# Patient Record
Sex: Male | Born: 1965 | Race: Black or African American | Hispanic: No | Marital: Single | Smoking: Never smoker
Health system: Southern US, Community
[De-identification: ages and names within clinical notes are randomized; demographics above are authoritative.]

## PROBLEM LIST (undated history)

## (undated) DIAGNOSIS — I10 Essential (primary) hypertension: Secondary | ICD-10-CM

## (undated) DIAGNOSIS — D869 Sarcoidosis, unspecified: Secondary | ICD-10-CM

## (undated) DIAGNOSIS — K219 Gastro-esophageal reflux disease without esophagitis: Secondary | ICD-10-CM

## (undated) DIAGNOSIS — I509 Heart failure, unspecified: Secondary | ICD-10-CM

## (undated) DIAGNOSIS — D649 Anemia, unspecified: Secondary | ICD-10-CM

## (undated) DIAGNOSIS — M1990 Unspecified osteoarthritis, unspecified site: Secondary | ICD-10-CM

## (undated) DIAGNOSIS — J189 Pneumonia, unspecified organism: Secondary | ICD-10-CM

## (undated) DIAGNOSIS — N183 Chronic kidney disease, stage 3 unspecified: Secondary | ICD-10-CM

## (undated) HISTORY — PX: HERNIA REPAIR: SHX51

---

## 2005-07-16 ENCOUNTER — Emergency Department (HOSPITAL_COMMUNITY): Admission: EM | Admit: 2005-07-16 | Discharge: 2005-07-16 | Payer: Self-pay | Admitting: *Deleted

## 2009-10-24 ENCOUNTER — Emergency Department (HOSPITAL_COMMUNITY): Admission: EM | Admit: 2009-10-24 | Discharge: 2009-10-24 | Payer: Self-pay | Admitting: Emergency Medicine

## 2009-12-01 ENCOUNTER — Encounter: Admission: RE | Admit: 2009-12-01 | Discharge: 2009-12-01 | Payer: Self-pay | Admitting: Internal Medicine

## 2010-08-07 LAB — URINALYSIS, ROUTINE W REFLEX MICROSCOPIC
Ketones, ur: NEGATIVE mg/dL
Protein, ur: NEGATIVE mg/dL
Specific Gravity, Urine: 1.022 (ref 1.005–1.030)
Urobilinogen, UA: 1 mg/dL (ref 0.0–1.0)

## 2010-08-07 LAB — BASIC METABOLIC PANEL
Creatinine, Ser: 1.1 mg/dL (ref 0.4–1.5)
Sodium: 138 mEq/L (ref 135–145)

## 2010-08-07 LAB — CBC: HCT: 43.4 % (ref 39.0–52.0)

## 2010-09-05 IMAGING — CR DG CHEST 1V
2 series · 2 of 2 positions shown · non-contrast
Comparison: None.

CLINICAL DATA: 44-year-old male undergoing physical exam.

CHEST - 1 VIEW

[view not recorded (1 of 2)]
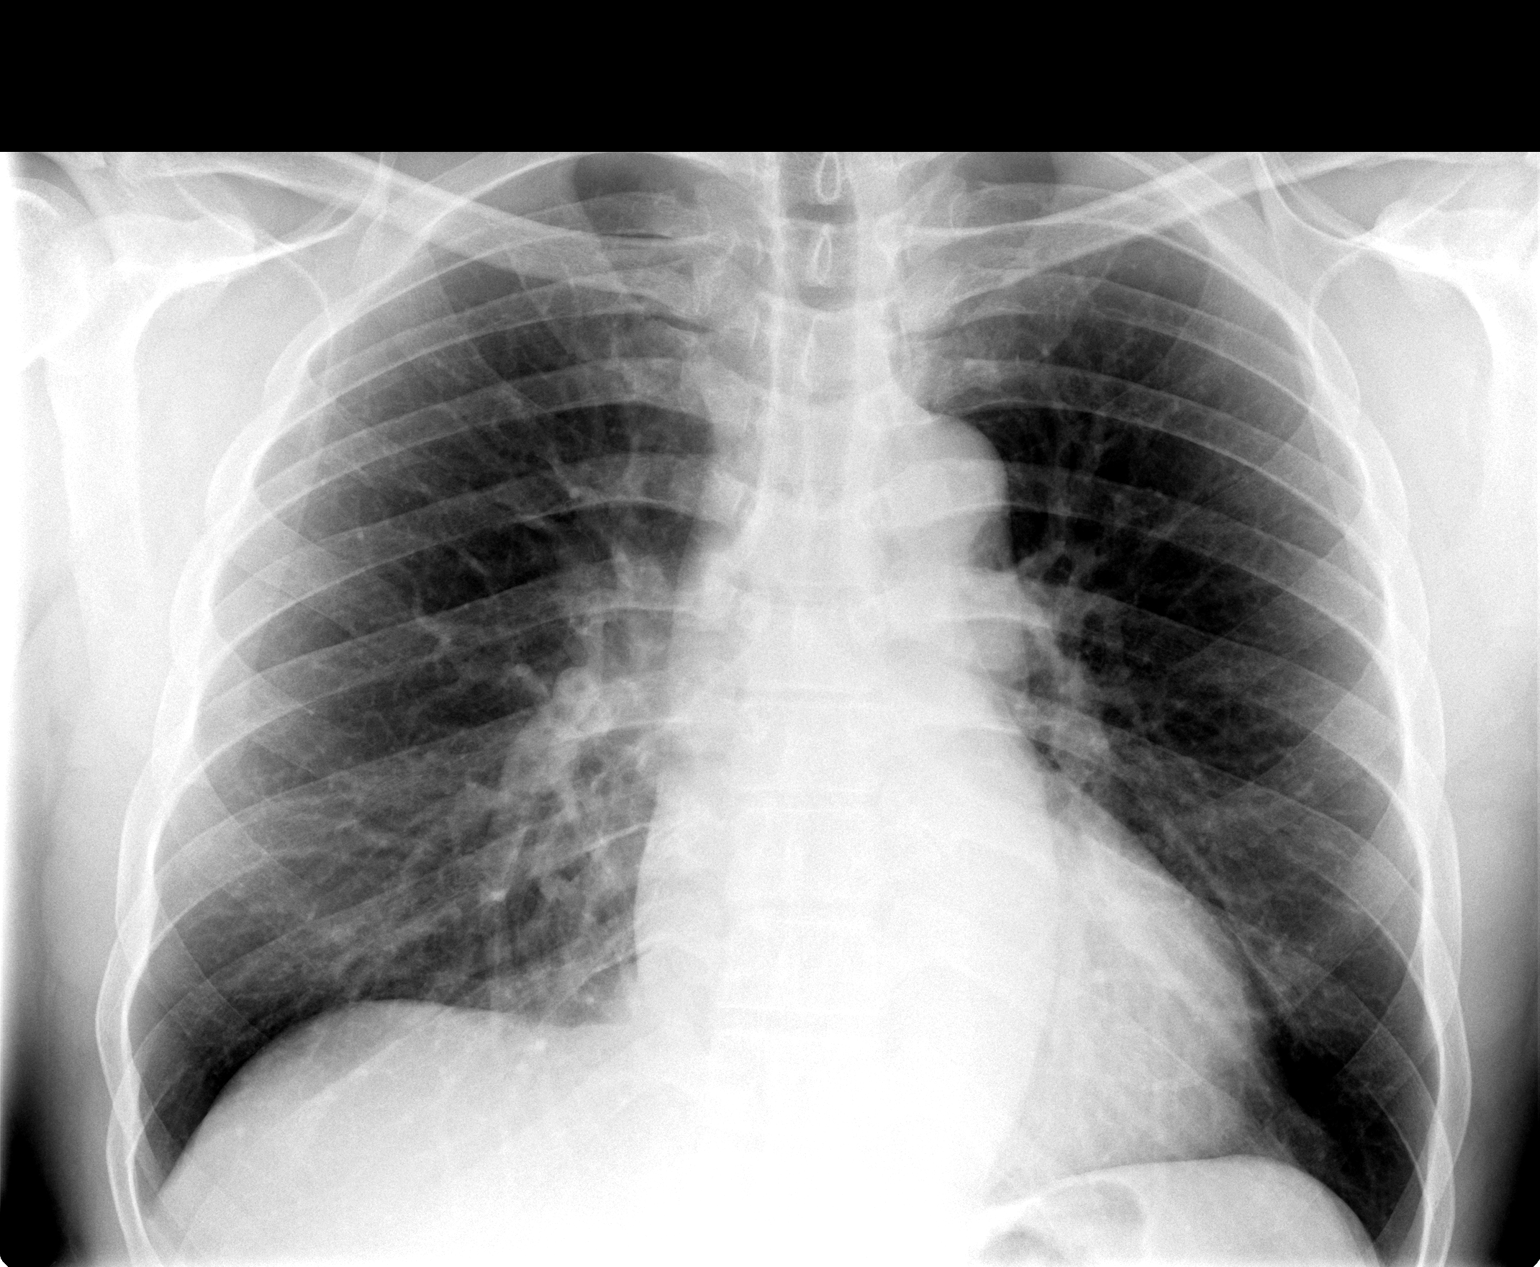

[view not recorded (2 of 2)]
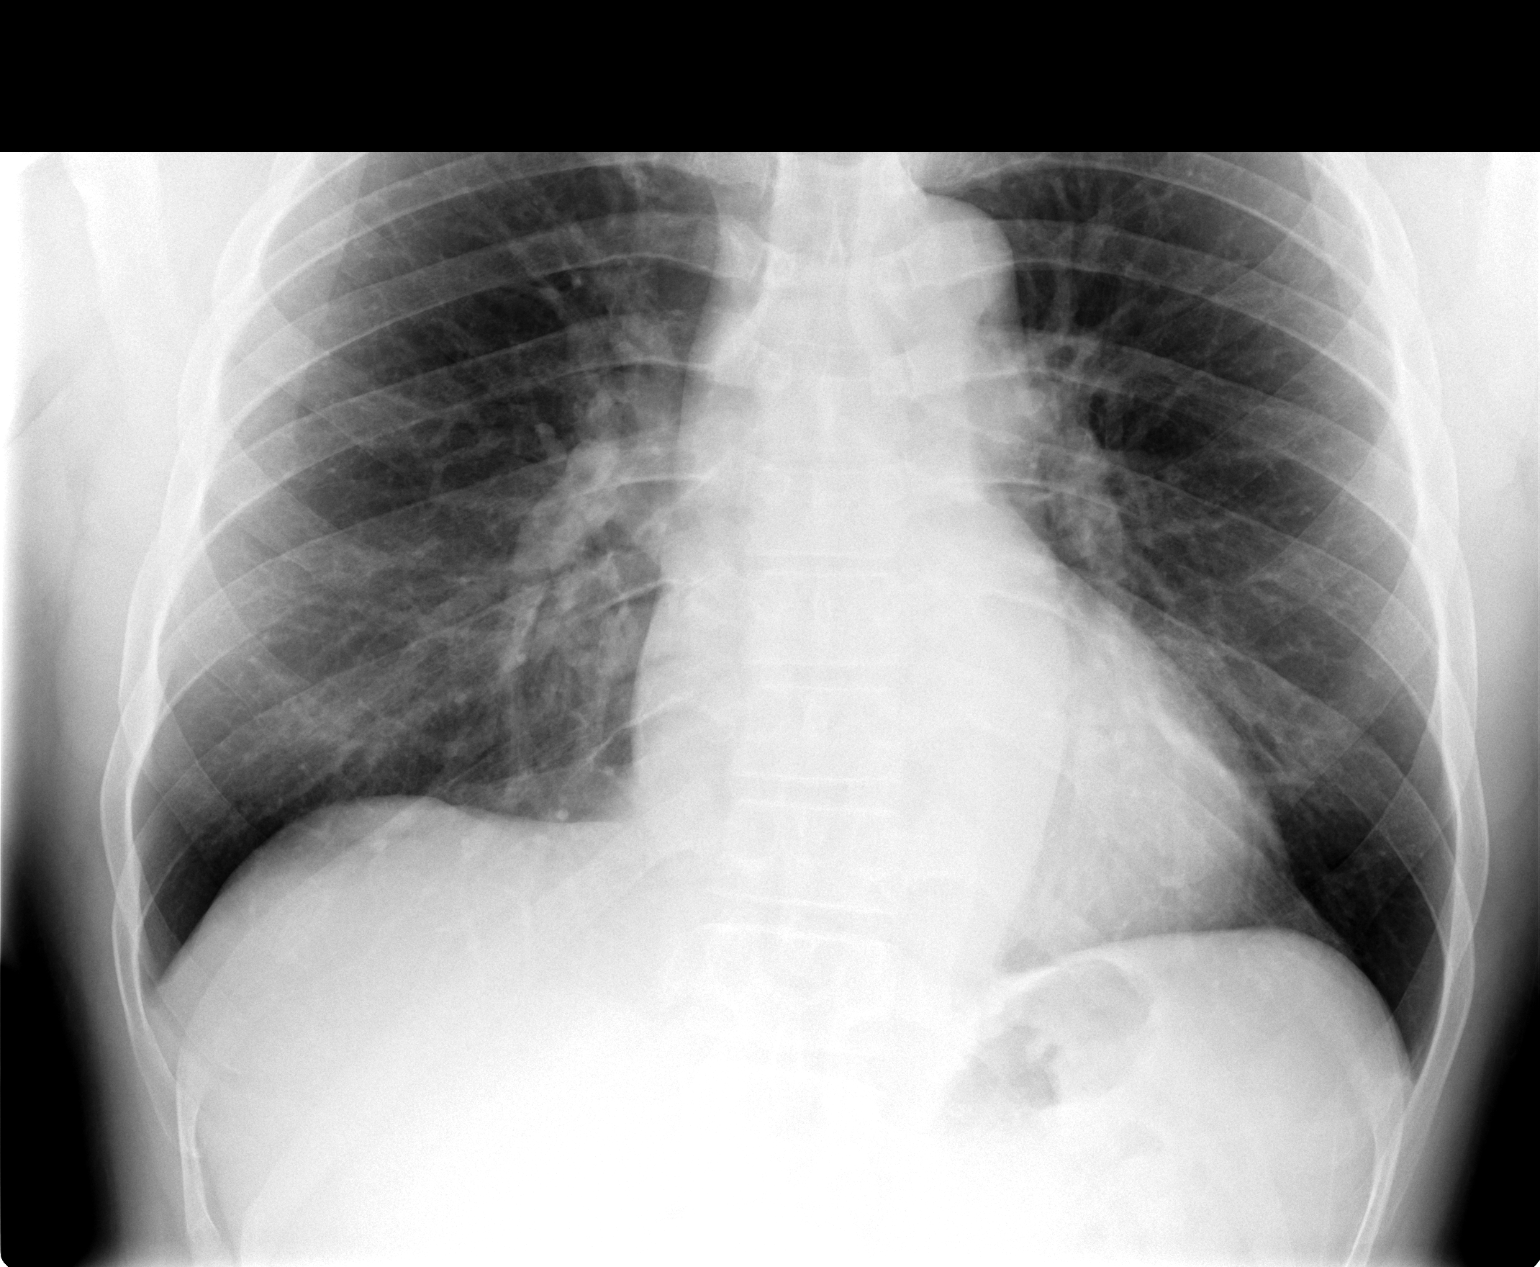

[2 of 2 positions shown; findings below may reference images not displayed]

FINDINGS: Mild elevation of the right hemidiaphragm.  Overall lung
volumes are within normal limits.  Cardiac size and mediastinal
contours are within normal limits.  Visualized tracheal air column
is within normal limits.  The lungs are clear. No acute osseous
abnormality identified.
IMPRESSION: No acute cardiopulmonary abnormality.

## 2013-04-24 ENCOUNTER — Encounter (HOSPITAL_COMMUNITY): Payer: Self-pay | Admitting: Emergency Medicine

## 2013-04-24 ENCOUNTER — Emergency Department (INDEPENDENT_AMBULATORY_CARE_PROVIDER_SITE_OTHER)
Admission: EM | Admit: 2013-04-24 | Discharge: 2013-04-24 | Disposition: A | Discharge status: Home / Self Care | Payer: PRIVATE HEALTH INSURANCE | Source: Home / Self Care

## 2013-04-24 DIAGNOSIS — R059 Cough, unspecified: Secondary | ICD-10-CM

## 2013-04-24 DIAGNOSIS — R05 Cough: Secondary | ICD-10-CM

## 2013-04-24 MED ORDER — IPRATROPIUM BROMIDE 0.06 % NA SOLN: 2.0000 | Freq: Four times a day (QID) | NASAL | Status: DC

## 2013-04-24 MED ORDER — HYDROCODONE-ACETAMINOPHEN 5-325 MG PO TABS: 0.5000 | ORAL_TABLET | Freq: Every evening | ORAL | Status: DC | PRN

## 2013-04-24 MED ORDER — AZITHROMYCIN 250 MG PO TABS: 250.0000 mg | ORAL_TABLET | Freq: Every day | ORAL | Status: DC

## 2013-04-24 NOTE — ED Provider Notes (Signed)
Ian Gregory is a 47 y.o. male who presents to Urgent Care today for 2 weeks of nonproductive cough. This is associated with some posttussive emesis as well. 3 weeks ago he had body aches fevers and chills. This resolved however he is left with this cough. He denies any current shortness of breath diarrhea chest pains or palpitations. He has tried some over-the-counter medications which have helped a little. He denies any reflux or wheezing.   Past Medical History  Diagnosis Date  . Hypertension    History  Substance Use Topics  . Smoking status: Never Smoker   . Smokeless tobacco: Not on file  . Alcohol Use: Yes   ROS as above Medications reviewed. No current facility-administered medications for this encounter.   Current Outpatient Prescriptions  Medication Sig Dispense Refill  . azithromycin (ZITHROMAX) 250 MG tablet Take 1 tablet (250 mg total) by mouth daily. Take first 2 tablets together, then 1 every day until finished.  6 tablet  0  . HYDROcodone-acetaminophen (NORCO/VICODIN) 5-325 MG per tablet Take 0.5 tablets by mouth at bedtime as needed (cough).  6 tablet  0  . ipratropium (ATROVENT) 0.06 % nasal spray Place 2 sprays into both nostrils 4 (four) times daily.  15 mL  1    Exam:  BP 158/105  Pulse 94  Temp(Src) 99.7 F (37.6 C) (Oral)  Resp 16  SpO2 100% Gen: Well NAD HEENT: EOMI,  MMM posterior pharynx with cobblestoning. Tympanic membranes are normal appearing bilaterally Lungs: Normal work of breathing. CTABL Heart: RRR no MRG Abd: NABS, Soft. NT, ND Exts: Non edematous BL  LE, warm and well perfused.    Assessment and Plan: 47 y.o. male with post oral cough versus bronchitis. Plan to treat with Atrovent nasal spray, hydrocodone cough medication, and azithromycin. Followup with primary care provider if not improved. Discussed warning signs or symptoms. Please see discharge instructions. Patient expresses understanding.      [name], MD 04/24/13  1249

## 2013-04-24 NOTE — ED Notes (Signed)
Pt c/o cold sxs onset 2 weeks Sxs include: dry cough and vomiting/nauseas Denies: f/d, SOB, wheezing Alert w/no signs of acute distress.

## 2013-06-08 ENCOUNTER — Emergency Department (HOSPITAL_COMMUNITY)
Admission: EM | Admit: 2013-06-08 | Discharge: 2013-06-09 | Disposition: A | Discharge status: Home / Self Care | Payer: Self-pay | Attending: Emergency Medicine | Admitting: Emergency Medicine

## 2013-06-08 ENCOUNTER — Encounter (HOSPITAL_COMMUNITY): Payer: Self-pay | Admitting: Emergency Medicine

## 2013-06-08 ENCOUNTER — Emergency Department (HOSPITAL_COMMUNITY): Payer: Self-pay

## 2013-06-08 DIAGNOSIS — R011 Cardiac murmur, unspecified: Secondary | ICD-10-CM | POA: Insufficient documentation

## 2013-06-08 DIAGNOSIS — R0602 Shortness of breath: Secondary | ICD-10-CM | POA: Insufficient documentation

## 2013-06-08 DIAGNOSIS — Z9119 Patient's noncompliance with other medical treatment and regimen: Secondary | ICD-10-CM | POA: Insufficient documentation

## 2013-06-08 DIAGNOSIS — R634 Abnormal weight loss: Secondary | ICD-10-CM | POA: Insufficient documentation

## 2013-06-08 DIAGNOSIS — R945 Abnormal results of liver function studies: Secondary | ICD-10-CM

## 2013-06-08 DIAGNOSIS — I10 Essential (primary) hypertension: Secondary | ICD-10-CM | POA: Insufficient documentation

## 2013-06-08 DIAGNOSIS — R918 Other nonspecific abnormal finding of lung field: Secondary | ICD-10-CM | POA: Insufficient documentation

## 2013-06-08 DIAGNOSIS — Z79899 Other long term (current) drug therapy: Secondary | ICD-10-CM | POA: Insufficient documentation

## 2013-06-08 DIAGNOSIS — R630 Anorexia: Secondary | ICD-10-CM | POA: Insufficient documentation

## 2013-06-08 DIAGNOSIS — R7989 Other specified abnormal findings of blood chemistry: Secondary | ICD-10-CM | POA: Insufficient documentation

## 2013-06-08 DIAGNOSIS — R9389 Abnormal findings on diagnostic imaging of other specified body structures: Secondary | ICD-10-CM

## 2013-06-08 DIAGNOSIS — Z91199 Patient's noncompliance with other medical treatment and regimen due to unspecified reason: Secondary | ICD-10-CM | POA: Insufficient documentation

## 2013-06-08 DIAGNOSIS — R61 Generalized hyperhidrosis: Secondary | ICD-10-CM | POA: Insufficient documentation

## 2013-06-08 DIAGNOSIS — N179 Acute kidney failure, unspecified: Secondary | ICD-10-CM | POA: Insufficient documentation

## 2013-06-08 DIAGNOSIS — R000 Tachycardia, unspecified: Secondary | ICD-10-CM | POA: Insufficient documentation

## 2013-06-08 LAB — CBC
HCT: 38.7 % — ABNORMAL LOW (ref 39.0–52.0)
HEMOGLOBIN: 13.1 g/dL (ref 13.0–17.0)
MCH: 29.4 pg (ref 26.0–34.0)
MCHC: 33.9 g/dL (ref 30.0–36.0)
MCV: 86.8 fL (ref 78.0–100.0)
Platelets: 183 10*3/uL (ref 150–400)
RBC: 4.46 MIL/uL (ref 4.22–5.81)
RDW: 13.4 % (ref 11.5–15.5)
WBC: 5.5 10*3/uL (ref 4.0–10.5)

## 2013-06-08 LAB — COMPREHENSIVE METABOLIC PANEL
ALT: 65 U/L — ABNORMAL HIGH (ref 0–53)
AST: 76 U/L — AB (ref 0–37)
Albumin: 3.7 g/dL (ref 3.5–5.2)
Alkaline Phosphatase: 251 U/L — ABNORMAL HIGH (ref 39–117)
BUN: 28 mg/dL — AB (ref 6–23)
CO2: 23 meq/L (ref 19–32)
Calcium: 10 mg/dL (ref 8.4–10.5)
Chloride: 97 mEq/L (ref 96–112)
Creatinine, Ser: 1.67 mg/dL — ABNORMAL HIGH (ref 0.50–1.35)
GFR calc Af Amer: 55 mL/min — ABNORMAL LOW (ref >90)
GFR calc non Af Amer: 47 mL/min — ABNORMAL LOW (ref >90)
Glucose, Bld: 90 mg/dL (ref 70–99)
Potassium: 4 mEq/L (ref 3.7–5.3)
Sodium: 136 mEq/L — ABNORMAL LOW (ref 137–147)
Total Bilirubin: 1.5 mg/dL — ABNORMAL HIGH (ref 0.3–1.2)
Total Protein: 8.5 g/dL — ABNORMAL HIGH (ref 6.0–8.3)

## 2013-06-08 LAB — POCT I-STAT TROPONIN I: TROPONIN I, POC: 0.03 ng/mL (ref 0.00–0.08)

## 2013-06-08 LAB — CG4 I-STAT (LACTIC ACID): Lactic Acid, Venous: 1.95 mmol/L (ref 0.5–2.2)

## 2013-06-08 LAB — LIPASE, BLOOD: Lipase: 48 U/L (ref 11–59)

## 2013-06-08 MED ORDER — SODIUM CHLORIDE 0.9 % IV BOLUS (SEPSIS)
1000.0000 mL | Freq: Once | INTRAVENOUS | Status: AC
  Administered 2013-06-08: 1000 mL via INTRAVENOUS

## 2013-06-08 MED ORDER — LISINOPRIL 20 MG PO TABS
20.0000 mg | ORAL_TABLET | Freq: Once | ORAL | Status: AC
  Administered 2013-06-09: 20 mg via ORAL
  Filled 2013-06-08: qty 1

## 2013-06-08 NOTE — ED Provider Notes (Signed)
CSN: [id number]     Arrival date & time 06/08/13  1903 History   First MD Initiated Contact with Patient 06/08/13 2059     Chief Complaint  Patient presents with  . Fatigue  . Night Sweats   (Consider location/radiation/quality/duration/timing/severity/associated sxs/prior Treatment) HPI Comments: Ian Gregory is a 48 year-old male with a past medical history of uncontrolled hypertention, presenting the Emergency Department with a chief complaint of generalized farigue and night sweats for 2 months.  He reports a 20 lb weight loss since November.  He also reports exertional dyspnea. Denies nausea, vomiting, diarrhea, constipation, decrease in appetite. He reports he was unable to have a stress test due to his BP being too elevated.  Prostate has been evaluated by Dr. Risa Grill.  Denies history of colonoscopy.  Reports occasional EtOH use on the weekends.  He also reports non compliance with his lisinopril-HCTZ for at least a month.      The history is provided by the patient, medical records and a significant other. No language interpreter was used.    Past Medical History  Diagnosis Date  . Hypertension    History reviewed. No pertinent past surgical history. Family History  Problem Relation Age of Onset  . Hypertension Mother   . Kidney failure Mother   . Diabetes Father   . Hypertension Father    History  Substance Use Topics  . Smoking status: Never Smoker   . Smokeless tobacco: Not on file  . Alcohol Use: Yes     Comment: socially    Review of Systems  Constitutional: Positive for fever, diaphoresis, appetite change, fatigue and unexpected weight change. Negative for chills.  Respiratory: Positive for shortness of breath. Negative for cough and chest tightness.   Cardiovascular: Negative for chest pain, palpitations and leg swelling.  Gastrointestinal: Negative for nausea, vomiting, abdominal pain, diarrhea and constipation.  Neurological: Positive for weakness.     Allergies  Review of patient's allergies indicates no known allergies.  Home Medications   Current Outpatient Rx  Name  Route  Sig  Dispense  Refill  . ibuprofen (ADVIL,MOTRIN) 200 MG tablet   Oral   Take 200 mg by mouth every 6 (six) hours as needed for moderate pain.         Marland Kitchen lisinopril-hydrochlorothiazide (PRINZIDE,ZESTORETIC) 20-25 MG per tablet   Oral   Take 1 tablet by mouth daily.          BP 190/110  Pulse 111  Temp(Src) 99 F (37.2 C) (Oral)  Resp 24  Ht 6\' 3"  (1.905 m)  Wt 189 lb 6 oz (85.9 kg)  BMI 23.67 kg/m2  SpO2 98% Physical Exam  Nursing note and vitals reviewed. Constitutional: He is oriented to person, place, and time. He appears well-developed and well-nourished. No distress.  HENT:  Head: Normocephalic and atraumatic.  Neck: Neck supple.  Cardiovascular: Regular rhythm.  Tachycardia present.   Murmur heard. No lower extremity edema  Pulmonary/Chest: Effort normal. No accessory muscle usage. Not tachypneic. No respiratory distress. He has no decreased breath sounds. He has no wheezes. He has no rhonchi.  Patient is able to speak in complete sentences.    Abdominal: Soft. There is no tenderness. There is no rebound and no guarding.  Musculoskeletal: Normal range of motion.  Neurological: He is alert and oriented to person, place, and time.  Skin: Skin is warm and dry. He is not diaphoretic.  Psychiatric: He has a normal mood and affect. His behavior is normal. Thought  content normal.    ED Course  Procedures (including critical care time) Labs Review Labs Reviewed  CBC - Abnormal; Notable for the following:    HCT 38.7 (*)    All other components within normal limits  COMPREHENSIVE METABOLIC PANEL - Abnormal; Notable for the following:    Sodium 136 (*)    BUN 28 (*)    Creatinine, Ser 1.67 (*)    Total Protein 8.5 (*)    AST 76 (*)    ALT 65 (*)    Alkaline Phosphatase 251 (*)    Total Bilirubin 1.5 (*)    GFR calc non Af Amer  47 (*)    GFR calc Af Amer 55 (*)    All other components within normal limits  LIPASE, BLOOD  POCT I-STAT TROPONIN I  CG4 I-STAT (LACTIC ACID)   Imaging Review Dg Chest 2 View  06/08/2013   CLINICAL DATA:  Fatigue. Night sweats. Cough for 2 months. Shortness of breath. Hypertension. Ex-smoker.  EXAM: CHEST  2 VIEW  COMPARISON:  DG CHEST 1 VIEW dated 12/01/2009  FINDINGS: Midline trachea. Normal heart size. Bilateral hilar and subcarinal adenopathy. No pleural effusion or pneumothorax. Clear lungs.  IMPRESSION: Since 12/01/2009, development of bilateral hilar and subcarinal adenopathy. Suspicious for lymphoproliferative process such as lymphoma. Inflammatory process such as sarcoidosis could look similar. Contrast enhanced chest CT is recommended. This could be performed non emergently.  These results were called by telephone at the time of interpretation on 06/08/2013 at 11:28 PM to Dr. Harvie Heck , who verbally acknowledged these results.   Electronically Signed   By: Abigail Miyamoto M.D.   On: 06/08/2013 23:33    EKG Interpretation    Date/Time:  Monday June 08 2013 21:39:07 EST Ventricular Rate:  111 PR Interval:  180 QRS Duration: 83 QT Interval:  340 QTC Calculation: 462 R Axis:   66 Text Interpretation:  Age not entered, assumed to be  48 years old for purpose of ECG interpretation Sinus tachycardia Probable left atrial enlargement ST elevation suggests acute pericarditis No prior Confirmed by Mingo Amber  MD, BLAIR ([id number]) on 06/08/2013 9:45:15 PM            MDM   1. New abnormality on chest x-ray   2. Acute kidney injury   3. Hypertension   4. Abnormal liver function tests    Pt with a 2 month history of night sweats, generalized weakness, weight loss and dyspnea, "B symptoms". Tachycardic and hypertensive in ED, will give bolus and reevaluate. Labs sent.  Discussed patient history and condition with Dr. Mingo Amber who advises chest XR to rule out tuberculosis.  XR ordered. CMP  with elevated LFT, and elevated Creatinine and BUN, acute kidney injury. EKG reads as pericarditis, discussed these findings with Dr. Claiborne Billings, discussed patient condition and the patient denies chest pain. Dr. Claiborne Billings interpretation of the EKG is early repolarization and LVH.  XR shows bilateral perihilar and subcarinal adenopathy, suspicious for lymphoma. Patient is persistently tachycardic despite fluids, and BP 187/127.  Lisinopril ordered.  Discussed lab results, imaging results, and treatment plan with the patient and his wife. Treatment plan to include a CT for evaluation of lymphoma. Return precautions given. Reports understanding and no other concerns at this time.  Patient is stable for discharge at this time.    Meds given in ED:  Medications  sodium chloride 0.9 % bolus 1,000 mL (0 mLs Intravenous Stopped 06/08/13 2341)  lisinopril (PRINIVIL,ZESTRIL) tablet 20 mg (20 mg Oral  Given 06/09/13 0042)    Discharge Medication List as of 06/09/2013 12:13 AM    START taking these medications   Details  lisinopril (PRINIVIL,ZESTRIL) 20 MG tablet Take 1 tablet (20 mg total) by mouth daily., Starting 06/09/2013, Until Discontinued, Print          Lorrine Kin, PA-C 06/10/13 1316

## 2013-06-08 NOTE — ED Notes (Signed)
Pt states for the past couple of months he has been feeling fatigued, weak, shaky, and having night sweats  Pt states he just hasnt been feeling well

## 2013-06-08 NOTE — ED Notes (Signed)
Pt states over the past couple of months he has lost about 20 lbs and states he is supposed to take blood pressure medication and has not been taking it for the past few months

## 2013-06-09 MED ORDER — LISINOPRIL 20 MG PO TABS: 20.0000 mg | ORAL_TABLET | Freq: Every day | ORAL | Status: DC

## 2013-06-09 NOTE — Discharge Instructions (Signed)
Call for a follow up appointment with a Family or Primary Care Provider about your elevated liver enzymes, high blood pressure, and acute kidney injury.  You will need a CT scan to evaluated the bilateral hilar and subcarinal adenopathy.  It is concerning for lymphoma and is important that you follow up for further evaluation of this abnormality. Return if Symptoms worsen.   Take medication as prescribed.

## 2013-06-10 NOTE — ED Provider Notes (Signed)
Medical screening examination/treatment/procedure(s) were conducted as a shared visit with non-physician practitioner(s) and myself.  I personally evaluated the patient during the encounter.  EKG Interpretation    Date/Time:  Monday June 08 2013 21:39:07 EST Ventricular Rate:  111 PR Interval:  180 QRS Duration: 83 QT Interval:  340 QTC Calculation: 462 R Axis:   66 Text Interpretation:  Age not entered, assumed to be  48 years old for purpose of ECG interpretation Sinus tachycardia Probable left atrial enlargement ST elevation suggests acute pericarditis No prior Confirmed by Mingo Amber  MD, Anays Detore ([id number]) on 06/08/2013 9:45:15 PM            48 year old male presents with fever, night sweats, hypertension. He's had fatigue for the past 2 months with his night sweats. He also states a 20 pound weight loss over the past 2 months. Labs normal. EKG with mild ST elevation diffusely, on further discuss this with the on-call cardiologist who stated not pericarditis since symptoms are not consistent with pericarditis but no chest pain. Chest x-ray concerning for possible lymphoma. Patient has followup doctor and instructed to followup. Patient stable for discharge  Osvaldo Shipper, MD 06/10/13 [phone]

## 2013-06-27 ENCOUNTER — Emergency Department (HOSPITAL_COMMUNITY)
Admission: EM | Admit: 2013-06-27 | Discharge: 2013-06-27 | Disposition: A | Discharge status: Home / Self Care | Payer: Self-pay | Attending: Emergency Medicine | Admitting: Emergency Medicine

## 2013-06-27 ENCOUNTER — Emergency Department (HOSPITAL_COMMUNITY): Payer: Self-pay

## 2013-06-27 ENCOUNTER — Encounter (HOSPITAL_COMMUNITY): Payer: Self-pay | Admitting: Emergency Medicine

## 2013-06-27 DIAGNOSIS — E860 Dehydration: Secondary | ICD-10-CM | POA: Insufficient documentation

## 2013-06-27 DIAGNOSIS — R4789 Other speech disturbances: Secondary | ICD-10-CM | POA: Insufficient documentation

## 2013-06-27 DIAGNOSIS — R1031 Right lower quadrant pain: Secondary | ICD-10-CM | POA: Insufficient documentation

## 2013-06-27 DIAGNOSIS — R109 Unspecified abdominal pain: Secondary | ICD-10-CM

## 2013-06-27 DIAGNOSIS — R1110 Vomiting, unspecified: Secondary | ICD-10-CM | POA: Insufficient documentation

## 2013-06-27 DIAGNOSIS — R634 Abnormal weight loss: Secondary | ICD-10-CM | POA: Insufficient documentation

## 2013-06-27 DIAGNOSIS — I10 Essential (primary) hypertension: Secondary | ICD-10-CM | POA: Insufficient documentation

## 2013-06-27 DIAGNOSIS — R000 Tachycardia, unspecified: Secondary | ICD-10-CM | POA: Insufficient documentation

## 2013-06-27 DIAGNOSIS — R9389 Abnormal findings on diagnostic imaging of other specified body structures: Secondary | ICD-10-CM | POA: Insufficient documentation

## 2013-06-27 DIAGNOSIS — Z79899 Other long term (current) drug therapy: Secondary | ICD-10-CM | POA: Insufficient documentation

## 2013-06-27 DIAGNOSIS — R748 Abnormal levels of other serum enzymes: Secondary | ICD-10-CM | POA: Insufficient documentation

## 2013-06-27 DIAGNOSIS — N289 Disorder of kidney and ureter, unspecified: Secondary | ICD-10-CM | POA: Insufficient documentation

## 2013-06-27 LAB — POCT I-STAT TROPONIN I: Troponin i, poc: 0.01 ng/mL (ref 0.00–0.08)

## 2013-06-27 LAB — COMPREHENSIVE METABOLIC PANEL
ALBUMIN: 3.6 g/dL (ref 3.5–5.2)
ALT: 43 U/L (ref 0–53)
AST: 47 U/L — AB (ref 0–37)
Alkaline Phosphatase: 226 U/L — ABNORMAL HIGH (ref 39–117)
BUN: 19 mg/dL (ref 6–23)
CO2: 24 mEq/L (ref 19–32)
CREATININE: 1.69 mg/dL — AB (ref 0.50–1.35)
Calcium: 9.6 mg/dL (ref 8.4–10.5)
Chloride: 92 mEq/L — ABNORMAL LOW (ref 96–112)
GFR calc Af Amer: 54 mL/min — ABNORMAL LOW (ref >90)
GFR calc non Af Amer: 47 mL/min — ABNORMAL LOW (ref >90)
GLUCOSE: 105 mg/dL — AB (ref 70–99)
Potassium: 4.2 mEq/L (ref 3.7–5.3)
SODIUM: 131 meq/L — AB (ref 137–147)
TOTAL PROTEIN: 8.5 g/dL — AB (ref 6.0–8.3)
Total Bilirubin: 1 mg/dL (ref 0.3–1.2)

## 2013-06-27 LAB — CBC WITH DIFFERENTIAL/PLATELET
BASOS ABS: 0 10*3/uL (ref 0.0–0.1)
BASOS PCT: 0 % (ref 0–1)
Eosinophils Absolute: 0.1 10*3/uL (ref 0.0–0.7)
Eosinophils Relative: 2 % (ref 0–5)
HEMATOCRIT: 38.8 % — AB (ref 39.0–52.0)
HEMOGLOBIN: 13.6 g/dL (ref 13.0–17.0)
LYMPHS PCT: 12 % (ref 12–46)
Lymphs Abs: 0.9 10*3/uL (ref 0.7–4.0)
MCH: 29.6 pg (ref 26.0–34.0)
MCHC: 35.1 g/dL (ref 30.0–36.0)
MCV: 84.5 fL (ref 78.0–100.0)
Monocytes Absolute: 1.6 10*3/uL — ABNORMAL HIGH (ref 0.1–1.0)
Monocytes Relative: 23 % — ABNORMAL HIGH (ref 3–12)
NEUTROS ABS: 4.3 10*3/uL (ref 1.7–7.7)
NEUTROS PCT: 62 % (ref 43–77)
PLATELETS: 244 10*3/uL (ref 150–400)
RBC: 4.59 MIL/uL (ref 4.22–5.81)
RDW: 13.1 % (ref 11.5–15.5)
WBC: 6.9 10*3/uL (ref 4.0–10.5)

## 2013-06-27 LAB — HEPATITIS PANEL, ACUTE
HCV Ab: NEGATIVE
HEP A IGM: NONREACTIVE
HEP B C IGM: NONREACTIVE
Hepatitis B Surface Ag: NEGATIVE

## 2013-06-27 LAB — LIPASE, BLOOD: LIPASE: 53 U/L (ref 11–59)

## 2013-06-27 LAB — URINALYSIS, ROUTINE W REFLEX MICROSCOPIC
BILIRUBIN URINE: NEGATIVE
Glucose, UA: NEGATIVE mg/dL
HGB URINE DIPSTICK: NEGATIVE
Ketones, ur: NEGATIVE mg/dL
Leukocytes, UA: NEGATIVE
NITRITE: NEGATIVE
Protein, ur: 30 mg/dL — AB
Specific Gravity, Urine: 1.011 (ref 1.005–1.030)
Urobilinogen, UA: 0.2 mg/dL (ref 0.0–1.0)
pH: 6.5 (ref 5.0–8.0)

## 2013-06-27 LAB — URINE MICROSCOPIC-ADD ON

## 2013-06-27 LAB — POCT I-STAT CREATININE: Creatinine, Ser: 1.8 mg/dL — ABNORMAL HIGH (ref 0.50–1.35)

## 2013-06-27 MED ORDER — FAMOTIDINE IN NACL 20-0.9 MG/50ML-% IV SOLN
20.0000 mg | Freq: Once | INTRAVENOUS | Status: AC
  Administered 2013-06-27: 20 mg via INTRAVENOUS
  Filled 2013-06-27: qty 50

## 2013-06-27 MED ORDER — FAMOTIDINE 20 MG PO TABS: 20.0000 mg | ORAL_TABLET | Freq: Two times a day (BID) | ORAL | Status: DC

## 2013-06-27 MED ORDER — IOHEXOL 300 MG/ML  SOLN
100.0000 mL | Freq: Once | INTRAMUSCULAR | Status: AC | PRN
Start: 2013-06-27 — End: 2013-06-27
  Administered 2013-06-27: 80 mL via INTRAVENOUS

## 2013-06-27 MED ORDER — METOPROLOL TARTRATE 100 MG PO TABS: 100.0000 mg | ORAL_TABLET | Freq: Two times a day (BID) | ORAL | Status: DC

## 2013-06-27 MED ORDER — METOCLOPRAMIDE HCL 5 MG/ML IJ SOLN
10.0000 mg | Freq: Once | INTRAMUSCULAR | Status: AC
  Administered 2013-06-27: 10 mg via INTRAVENOUS
  Filled 2013-06-27: qty 2

## 2013-06-27 MED ORDER — PANTOPRAZOLE SODIUM 40 MG IV SOLR
40.0000 mg | Freq: Once | INTRAVENOUS | Status: AC
  Administered 2013-06-27: 40 mg via INTRAVENOUS
  Filled 2013-06-27: qty 40

## 2013-06-27 MED ORDER — IOHEXOL 300 MG/ML  SOLN
50.0000 mL | Freq: Once | INTRAMUSCULAR | Status: AC | PRN
Start: 2013-06-27 — End: 2013-06-27
  Administered 2013-06-27: 50 mL via ORAL

## 2013-06-27 MED ORDER — SODIUM CHLORIDE 0.9 % IV BOLUS (SEPSIS)
1000.0000 mL | Freq: Once | INTRAVENOUS | Status: AC
  Administered 2013-06-27: 1000 mL via INTRAVENOUS

## 2013-06-27 MED ORDER — SODIUM CHLORIDE 0.9 % IV BOLUS (SEPSIS)
2000.0000 mL | Freq: Once | INTRAVENOUS | Status: AC
  Administered 2013-06-27: 2000 mL via INTRAVENOUS

## 2013-06-27 MED ORDER — OMEPRAZOLE 20 MG PO CPDR: 20.0000 mg | DELAYED_RELEASE_CAPSULE | Freq: Every day | ORAL | Status: DC

## 2013-06-27 NOTE — ED Provider Notes (Signed)
CSN: [id number]     Arrival date & time 06/27/13  0709 History   First MD Initiated Contact with Patient 06/27/13 [phone]     Chief Complaint  Patient presents with  . Emesis  . Generalized Body Aches   (Consider location/radiation/quality/duration/timing/severity/associated sxs/prior Treatment) HPI 48 year old male with 2-3 months of generalized fatigue and 20 pound weight loss, since his last ED visit weeks ago he now has 24-hour a day constant generalized body aches with constant right lower quadrant and suprapubic abdominal pain as well as daily vomiting small amounts of nonbloody emesis several times a day with no diarrhea no bloody vomiting no bloody stools no chest pain cough or shortness of breath he does have constant sour taste in the back of his throat with dysphasia with otodynia no facial without drooling or voice change or stridor or neck swelling; nothing makes his symptoms worse or better except sometimes when he tries to eat his abdominal pain may be somewhat worse. His dysphasia occurs every time he tries to eat or drink anything. The symptoms are mild to moderate and there is no treatment prior to arrival. He is no redness or swelling to his joints. He does report a couple weeks of clear rhinorrhea with nasal congestion as well. Past Medical History  Diagnosis Date  . Hypertension    No past surgical history on file. Family History  Problem Relation Age of Onset  . Hypertension Mother   . Kidney failure Mother   . Diabetes Father   . Hypertension Father    History  Substance Use Topics  . Smoking status: Never Smoker   . Smokeless tobacco: Not on file  . Alcohol Use: Yes     Comment: socially    Review of Systems 10 Systems reviewed and are negative for acute change except as noted in the HPI. Allergies  Review of patient's allergies indicates no known allergies.  Home Medications   Current Outpatient Rx  Name  Route  Sig  Dispense  Refill  . ibuprofen  (ADVIL,MOTRIN) 200 MG tablet   Oral   Take 200 mg by mouth every 6 (six) hours as needed for moderate pain.         . famotidine (PEPCID) 20 MG tablet   Oral   Take 1 tablet (20 mg total) by mouth 2 (two) times daily.   10 tablet   0   . metoprolol (LOPRESSOR) 100 MG tablet   Oral   Take 1 tablet (100 mg total) by mouth 2 (two) times daily.   60 tablet   0   . omeprazole (PRILOSEC) 20 MG capsule   Oral   Take 1 capsule (20 mg total) by mouth daily.   5 capsule   0    BP 193/130  Pulse 112  Temp(Src) 98.7 F (37.1 C) (Oral)  Resp 17  SpO2 100% Physical Exam  Nursing note and vitals reviewed. Constitutional:  Awake, alert, nontoxic appearance.  HENT:  Head: Atraumatic.  Eyes: Right eye exhibits no discharge. Left eye exhibits no discharge.  Neck: Neck supple.  Cardiovascular: Regular rhythm.   No murmur heard. Tachycardic  Pulmonary/Chest: Effort normal and breath sounds normal. No respiratory distress. He has no wheezes. He has no rales. He exhibits no tenderness.  Abdominal: Soft. Bowel sounds are normal. He exhibits no distension and no mass. There is tenderness. There is no rebound and no guarding.  Mildly tender right lower quadrant and suprapubic area without rebound  Musculoskeletal: He exhibits tenderness.  He exhibits no edema.  Baseline ROM, no obvious new focal weakness. Minimal diffuse tenderness to the entire back and all 4 extremities.  Neurological: He is alert.  Mental status and motor strength appears baseline for patient and situation.  Skin: No rash noted.  Psychiatric: He has a normal mood and affect.    ED Course  Procedures (including critical care time) Pt stable in ED with no significant deterioration in condition. Patient informed of clinical course, understand medical decision-making process, and agree with plan. Labs Review Labs Reviewed  CBC WITH DIFFERENTIAL - Abnormal; Notable for the following:    HCT 38.8 (*)    Monocytes  Relative 23 (*)    Monocytes Absolute 1.6 (*)    All other components within normal limits  COMPREHENSIVE METABOLIC PANEL - Abnormal; Notable for the following:    Sodium 131 (*)    Chloride 92 (*)    Glucose, Bld 105 (*)    Creatinine, Ser 1.69 (*)    Total Protein 8.5 (*)    AST 47 (*)    Alkaline Phosphatase 226 (*)    GFR calc non Af Amer 47 (*)    GFR calc Af Amer 54 (*)    All other components within normal limits  URINALYSIS, ROUTINE W REFLEX MICROSCOPIC - Abnormal; Notable for the following:    Protein, ur 30 (*)    All other components within normal limits  POCT I-STAT CREATININE - Abnormal; Notable for the following:    Creatinine, Ser 1.80 (*)    All other components within normal limits  LIPASE, BLOOD  URINE MICROSCOPIC-ADD ON  HEPATITIS PANEL, ACUTE  POCT I-STAT TROPONIN I   Imaging Review Ct Chest W Contrast  06/27/2013   CLINICAL DATA:  Abdominal pain, fatigue  EXAM: CT CHEST, ABDOMEN, AND PELVIS WITH CONTRAST  TECHNIQUE: Multidetector CT imaging of the chest, abdomen and pelvis was performed following the standard protocol during bolus administration of intravenous contrast.  CONTRAST:  46mL OMNIPAQUE IOHEXOL 300 MG/ML SOLN, 10mL OMNIPAQUE IOHEXOL 300 MG/ML SOLN  COMPARISON:  None.  FINDINGS: CT CHEST FINDINGS  The lungs are well aerated bilaterally without focal infiltrate or sizable effusion. Bulky bilateral hilar adenopathy is identified. The largest of these on the left measures 2 cm in short axis. The largest on the right measures approximately 2.3 cm in short axis. Subcarinal adenopathy is noted as well as aortic or pulmonary window lymph nodes and prevascular lymph nodes. The cardiac structures are within normal limits. The bony structures are within normal limits. No axillary adenopathy is seen.  CT ABDOMEN AND PELVIS FINDINGS  The liver, gallbladder, spleen, adrenal glands and pancreas are within normal limits. The kidneys are well visualized bilaterally without  renal calculi or urinary tract obstructive changes. Mild rotation anomalies are noted of both kidneys. A retro aortic left renal vein is noted. The bladder is well distended. No pelvic mass lesion is seen. No significant pelvic lymphadenopathy is noted. No acute bony abnormality is noted.  IMPRESSION: Bilateral hilar and mediastinal lymphadenopathy is identified. 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 Further workup is recommended.   Electronically Signed   By: Inez Catalina M.D.   On: 06/27/2013 09:34   Ct Abdomen Pelvis W Contrast  06/27/2013   CLINICAL DATA:  Abdominal pain, fatigue  EXAM: CT CHEST, ABDOMEN, AND PELVIS WITH CONTRAST  TECHNIQUE: Multidetector CT imaging of the chest, abdomen and pelvis was performed  following the standard protocol during bolus administration of intravenous contrast.  CONTRAST:  24mL OMNIPAQUE IOHEXOL 300 MG/ML SOLN, 72mL OMNIPAQUE IOHEXOL 300 MG/ML SOLN  COMPARISON:  None.  FINDINGS: CT CHEST FINDINGS  The lungs are well aerated bilaterally without focal infiltrate or sizable effusion. Bulky bilateral hilar adenopathy is identified. The largest of these on the left measures 2 cm in short axis. The largest on the right measures approximately 2.3 cm in short axis. Subcarinal adenopathy is noted as well as aortic or pulmonary window lymph nodes and prevascular lymph nodes. The cardiac structures are within normal limits. The bony structures are within normal limits. No axillary adenopathy is seen.  CT ABDOMEN AND PELVIS FINDINGS  The liver, gallbladder, spleen, adrenal glands and pancreas are within normal limits. The kidneys are well visualized bilaterally without renal calculi or urinary tract obstructive changes. Mild rotation anomalies are noted of both kidneys. A retro aortic left renal vein is noted. The bladder is well distended. No pelvic mass lesion is seen. No  significant pelvic lymphadenopathy is noted. No acute bony abnormality is noted.  IMPRESSION: Bilateral hilar and mediastinal lymphadenopathy is identified. 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 Further workup is recommended.   Electronically Signed   By: Inez Catalina M.D.   On: 06/27/2013 09:34    EKG Interpretation    Date/Time:  Saturday June 27 2013 08:06:59 EST Ventricular Rate:  115 PR Interval:  194 QRS Duration: 83 QT Interval:  322 QTC Calculation: 445 R Axis:   96 Text Interpretation:  Right and left arm electrode reversal, interpretation assumes no reversal Sinus tachycardia Probable left atrial enlargement LVH with secondary repolarization abnormality Probable lateral infarct, age indeterminate Anterior Q waves, possibly due to LVH Compared to previous tracing Right axis deviation NOW PRESENT Confirmed by Stevie Kern  MD, Elleana Stillson ([id number]) on 06/27/2013 8:11:54 AM            MDM   1. Abdominal pain   2. Abnormal CT scan, chest   3. Elevated liver enzymes   4. Renal insufficiency   5. Dehydration    I doubt any other EMC precluding discharge at this time including, but not necessarily limited to the following:SBI, peritonitis.    Babette Relic, MD 06/27/13 2022

## 2013-06-27 NOTE — ED Notes (Signed)
Patient transported to CT 

## 2013-06-27 NOTE — ED Notes (Signed)
Pt states that he was here in January for fatigue, weight loss.  States that since then, he has vomited every day.  States that he has been having indigestion and body aches.

## 2013-06-27 NOTE — Discharge Instructions (Signed)
Abdominal (belly) pain can be caused by many things. Your caregiver performed an examination and possibly ordered blood/urine tests and imaging (CT scan, x-rays, ultrasound). Many cases can be observed and treated at home after initial evaluation in the emergency department. Even though you are being discharged home, abdominal pain can be unpredictable. Therefore, you need a repeated exam if your pain does not resolve, returns, or worsens. Most patients with abdominal pain don't have to be admitted to the hospital or have surgery, but serious problems like appendicitis and gallbladder attacks can start out as nonspecific pain. Many abdominal conditions cannot be diagnosed in one visit, so follow-up evaluations are very important. SEEK IMMEDIATE MEDICAL ATTENTION IF: The pain does not go away or becomes severe.  A temperature above 101 develops.  Repeated vomiting occurs (multiple episodes).  The pain becomes localized to portions of the abdomen. The right side could possibly be appendicitis. In an adult, the left lower portion of the abdomen could be colitis or diverticulitis.  Blood is being passed in stools or vomit (bright red or black tarry stools).  Return also if you develop chest pain, difficulty breathing, dizziness or fainting, or become confused, poorly responsive, or inconsolable (young children).         Emergency Department Resource Guide 1) Find a Doctor and Pay Out of Pocket Although you won't have to find out who is covered by your insurance plan, it is a good idea to ask around and get recommendations. You will then need to call the office and see if the doctor you have chosen will accept you as a new patient and what types of options they offer for patients who are self-pay. Some doctors offer discounts or will set up payment plans for their patients who do not have insurance, but you will need to ask so you aren't surprised when you get to your appointment.  2) Contact Your Local  Health Department Not all health departments have doctors that can see patients for sick visits, but many do, so it is worth a call to see if yours does. If you don't know where your local health department is, you can check in your phone book. The CDC also has a tool to help you locate your state's health department, and many state websites also have listings of all of their local health departments.  3) Find a [facility] If your illness is not likely to be very severe or complicated, you may want to try a walk in clinic. These are popping up all over the country in pharmacies, drugstores, and shopping centers. They're usually staffed by nurse practitioners or physician assistants that have been trained to treat common illnesses and complaints. They're usually fairly quick and inexpensive. However, if you have serious medical issues or chronic medical problems, these are probably not your best option.  No Primary Care Doctor: - Call Health Connect at  [phone] - they can help you locate a primary care doctor that  accepts your insurance, provides certain services, etc. - Physician Referral Service- [phone]  Chronic Pain Problems: Organization         Address  Phone   Notes  [facility]  [phone] Patients need to be referred by their primary care doctor.   Medication Assistance: Organization         Address  Phone   Notes  [facility] Medication [facility] [address] [place], [place] [address] [phone] --Must be a  resident of [facility] -- Must have NO insurance coverage whatsoever (no Medicaid/ Medicare, etc.) -- The pt. MUST have a primary care doctor that directs their care regularly and follows them in the community   [facility]ist  [phone]   [employer]  [phone]    Agencies that provide inexpensive medical care: Organization         Address  Phone   Notes  [place]  [phone]    [facility]    [phone]   [facility] [place], [place] [address] [phone]   [place] [address] [place] [phone]   Planned Parenthood    [phone]   [facility]    [phone]   [place] and [address], [place] Phone:  [phone], Fax:  [phone] Hours of Operation:  9 am - 6 pm, M-F.  Also accepts Medicaid/Medicare and self-pay.  [facility] for [place] [address], [address], [place] Phone: [phone], Fax: [phone]. Hours of Operation:  8:30 am - 5:30 pm, M-F.  Also accepts Medicaid and self-pay.  [facility] [place] [address], [place] Phone: [phone]   [place], [place], [place] [phone], Ext. [phone] Mondays & Thursdays: 7-9 AM.  First 15 patients are seen on a first come, first serve basis.    [place] Providers:  Organization         Address  Phone   Notes  [facility] [address],  [phone] Also accepts self-pay patients.  [facility] [id number] [place], [place]  [phone]   [place], [address], [place] [phone]   [facility] [address] [place] [phone]   Lucianne Lei [address], [place]   [phone] Only accepts [place] Access [place] patients after they have their name applied to their card.   Self-Pay (no insurance) in [facility]:  Organization         Address  Phone   Notes  Sickle Cell Patients, [facility] Internal Medicine [place] [phone]   [facility] [place] [phone]   [facility] [place]  [place], [place],  [place] [phone]   [facility]/Dr. Osei-Bonsu  [address], [place] or [address], [place] [phone] Phone number for both [place] and [place] locations is the same.  Urgent Medical and [facility] [address] [place] [phone]   [facility] [address] [place] or [address] [phone]   [facility] [address] [place] [phone], phone; [phone], fax Sees patients 1st and 3rd Saturday of every month.  Must not qualify for public or private insurance (i.e. Medicaid, Medicare, [place] Health Choice, Veterans' Benefits)  Household income should be no more than 200% of the poverty level The clinic cannot treat you if you are pregnant or think you are pregnant  Sexually transmitted diseases are not treated at the clinic.   Dental Care: Organization         Address  Phone  Notes  [facility] Department of [facility] [place], [place] [phone] Accepts children up to age 58 who are enrolled in [place] or [place]; pregnant women with a Medicaid card; and children who have applied for Medicaid or [place] Health Choice, but were declined, whose parents can pay a reduced fee at time of service.  [facility] Department of [facility]  [address], [place] [phone] Accepts children up to age 23 who are enrolled in [place] or [place]; pregnant women with a Medicaid card; and children who have applied for Medicaid or  Health Choice, but were declined, whose parents can pay a reduced fee at time of service.  [place] Adult Dental Access PROGRAM  [place] [phone] Patients are seen by appointment only. Walk-ins are not accepted. [place] will see patients 74 years of age and older. Monday - Tuesday (8am-5pm) Most Wednesdays  (8:30-5pm) $30 per visit, cash only  [facility] Adult Dental Access PROGRAM  [address], [facility] [phone] Patients are seen by appointment only. Walk-ins are not accepted. [place] will see patients 92 years of age and older. One Wednesday Evening (Monthly: Volunteer Based).  $30 per visit, cash only  [place]  [phone] for adults; Children under age 13, call Graduate Pediatric Dentistry at [phone]. Children aged 34-14, please call [phone] to request a pediatric application.  Dental services are provided in all areas of dental care including fillings, crowns and bridges, complete and partial dentures, implants, gum treatment, root canals, and extractions. Preventive care is also provided. Treatment is provided to both adults and children. Patients are selected via a lottery and there is often a waiting list.   [facility] [address] [place]  [phone] [email or web address]   [facility] [address] [place], [place] [phone], Ext. [phone] Second and Fourth Thursday of each month, opens at 6:30 AM; Clinic ends at 9 AM.  Patients are seen on a first-come first-served basis, and a limited number are seen during each clinic.   [facility]  [address] Hillard Danker [place], [place] [phone]   Eligibility Requirements You must have lived in [place], [place], or [place] counties for at least the last three months.   You cannot be eligible for state or federal sponsored [employer], including [employer], [place], or [employer].   You generally cannot be eligible for healthcare insurance through your employer.    How to apply: Eligibility screenings are held every Tuesday and Wednesday afternoon from 1:00 pm until 4:00 pm. You do not need an appointment for the interview!  [facility] [address], [place], [place]   [place]  [place] Department  [place]  [phone]    Behavioral Health Resources in the Community: Intensive Outpatient Programs Organization         Address  Phone  Notes  [place]. [address], [place], [place] [phone]   [facility] Outpatient [address] [place], [place]   ADS: Alcohol & Drug Svcs [address] [place], [place]   [place] [address],  [place], [place] or [phone]   Substance Abuse Resources Organization  Address  Phone  Notes  Alcohol and Drug Services  [phone]   [place]  [phone]   [facility] [place]  [phone]   Chinita Pester  [phone]   Residential & Outpatient Substance Abuse Program  [phone]   Psychological Services Organization         Address  Phone  Notes  [facility] [place]  [place]  [phone]   [place] [address] [place] or [phone]    Mobile Crisis Teams Organization         Address  Phone  Notes  [facility], Mobile Crisis Care Unit  [phone]   Assertive Psychotherapeutic Services  [address] [place], [place]   [place] Levels [address], [place] [phone]    Self-Help/Support Groups Organization         Address  Phone             Notes  [place]. of [place] - variety of support groups  [place] Call for more information  Narcotics Anonymous (NA), [facility] [address], [employer] [place]  2 meetings at this location   Special educational needs teacher         Address  Phone  Notes  ASAP Residential Treatment [place],    [place]  [phone]   [facility]  [address], [place] [phone], [place], [place]   [place], [place] [phone] Admissions: 8am-3pm M-F  Incentives Substance [place] [address],    [place], [place] [phone]   [facility] [address] [place], [place], [place]   [facility] [address]  [place], [place]   [facility] Programs - Intensive Outpatient [address], [place], [place]   [facility] ([place].) [place].,  [place], [place] [phone] or [phone]   [facility]idential Treatment Services (RTS) [address], [place], [place] Accepts Medicaid  [facility] [place] [address],  [place] [phone] Substance Abuse/Addiction Treatment   [facility] Organization         Address  Phone  Notes  [facility]  [phone]   Domenic Schwab, PhD [address] Arlis Porta [place], [place]   [phone] or [phone]   [place], [place] [phone]   [facility] [address], [place], [place] [phone] Insurance/Medicaid/sponsorship through [facility] and Families [address], [address]                                    [place], [place] [phone] [place] [address], [place] [phone]    Dr. Adele Schilder  [phone]   [facility] [place] Dept. 1) [address] [place] 2) [place] 3)  [place] [address], [place] [phone]  [phone]   [place] [phone] or [phone] (After Hours)

## 2013-06-27 NOTE — ED Notes (Signed)
Pt aware of the need for urine 

## 2013-08-03 ENCOUNTER — Institutional Professional Consult (permissible substitution): Payer: Self-pay | Admitting: Internal Medicine

## 2013-08-07 ENCOUNTER — Encounter (INDEPENDENT_AMBULATORY_CARE_PROVIDER_SITE_OTHER): Payer: Self-pay

## 2013-08-07 ENCOUNTER — Encounter: Payer: Self-pay | Admitting: Internal Medicine

## 2013-08-07 ENCOUNTER — Ambulatory Visit (INDEPENDENT_AMBULATORY_CARE_PROVIDER_SITE_OTHER): Payer: BC Managed Care – PPO | Admitting: Internal Medicine

## 2013-08-07 ENCOUNTER — Other Ambulatory Visit (INDEPENDENT_AMBULATORY_CARE_PROVIDER_SITE_OTHER): Payer: BC Managed Care – PPO

## 2013-08-07 VITALS — BP 164/96 | HR 90 | Temp 99.3°F | Ht 74.0 in | Wt 201.0 lb

## 2013-08-07 DIAGNOSIS — D860 Sarcoidosis of lung: Secondary | ICD-10-CM | POA: Insufficient documentation

## 2013-08-07 DIAGNOSIS — D869 Sarcoidosis, unspecified: Secondary | ICD-10-CM

## 2013-08-07 DIAGNOSIS — I10 Essential (primary) hypertension: Secondary | ICD-10-CM

## 2013-08-07 LAB — ANGIOTENSIN CONVERTING ENZYME: ANGIOTENSIN-CONVERTING ENZYME: 67 U/L — AB (ref 8–52)

## 2013-08-07 LAB — SEDIMENTATION RATE: Sed Rate: 51 mm/hr — ABNORMAL HIGH (ref 0–22)

## 2013-08-07 MED ORDER — NEBIVOLOL HCL 10 MG PO TABS: ORAL_TABLET | ORAL | Status: DC

## 2013-08-07 MED ORDER — PREDNISONE 10 MG PO TABS: ORAL_TABLET | ORAL | Status: DC

## 2013-08-07 NOTE — Patient Instructions (Addendum)
Please remember to go to the lab  department downstairs for your tests - we will call you with the results when they are available.   Prednisone 10 mg take  4 each am x 2 days,   2 each am x 2 days,  1 each am x 2 days and stop   Stop lopressor  Start bystolic 10 mg one twice daily in place of lopressor  Please schedule a follow up office visit in 2 weeks, sooner if needed with cxr

## 2013-08-07 NOTE — Progress Notes (Signed)
Quick Note:  Spoke with pt and notified of results per Dr. Wert. Pt verbalized understanding and denied any questions.  ______ 

## 2013-08-07 NOTE — Progress Notes (Signed)
   Subjective:    Patient ID: Ian Shade., male    DOB: Oct 19, 1965   MRN: [id number]  HPI  68 yobm never regular smoker with HBP last well  Sept 2014 with acute onset flu like illness in Oct 2014 and saw UC Dec 2014 but no cxr dx then cxr done on return  06/08/13 with wt loss > cxr hilar adenopathy > CT 06/27/13. And referred to pulmonary clinic with ? Sarcoidosis 08/07/13     08/07/2013 1st [place] [facility] office visit/ Merelin Human  Chief Complaint  Patient presents with  . Pulmonary Consult    Self referral for eval of abnormal CT Chest. He c/o fatigue and CP since Oct 2014. Went to ED in Jan with cough and fatigue and cxr was done, then CT done in Feb 2015.His CP is sharp and comes and goes.   joint problems main concern x 4 week esp feet and ankles but no rash, no fever, cough is better and Not limited by breathing from desired activities . No ex or pleuritic cp.  No obvious   day to day or daytime variabilty or assoc   chest tightness, subjective wheeze overt sinus   symptoms. No unusual exp hx or h/o childhood pna/ asthma or knowledge of premature birth.  Sleeping ok without nocturnal  or early am exacerbation  of respiratory  c/o's or need for noct saba. Also denies any obvious fluctuation of symptoms with weather or environmental changes or other aggravating or alleviating factors except as outlined above   Current Medications, Allergies, Complete Past Medical History, Past Surgical History, Family History, and Social History were reviewed in [employer] record.            Review of Systems  Constitutional: Negative for fever, chills, activity change, appetite change and unexpected weight change.  HENT: Positive for dental problem. Negative for congestion, postnasal drip, rhinorrhea, sneezing, sore throat, trouble swallowing and voice change.   Eyes: Negative for visual disturbance.  Respiratory: Negative for cough, choking and shortness of breath.    Cardiovascular: Positive for chest pain. Negative for leg swelling.  Gastrointestinal: Negative for nausea, vomiting and abdominal pain.  Genitourinary: Negative for difficulty urinating.       Heartburn  Musculoskeletal: Negative for arthralgias.  Skin: Negative for rash.  Psychiatric/Behavioral: Negative for behavioral problems and confusion.       Objective:   Physical Exam  Wt Readings from Last 3 Encounters:  08/07/13 201 lb (91.173 kg)  06/08/13 189 lb 6 oz (85.9 kg)      HEENT: nl dentition, turbinates, and orophanx. Nl external ear canals without cough reflex   NECK :  without JVD/Nodes/TM/ nl carotid upstrokes bilaterally   LUNGS: no acc muscle use, clear to A and P bilaterally without cough on insp or exp maneuvers   CV:  RRR  no s3 or murmur or increase in P2, no edema   ABD:  soft and nontender with nl excursion in the supine position. No bruits or organomegaly, bowel sounds nl  MS:  warm without deformities, calf tenderness, cyanosis or clubbing  SKIN: warm and dry without lesions    NEURO:  alert, approp, no deficits     CT  2/715 Bilateral hilar and mediastinal lymphadenopathy is identified.  Although the possibility of a lymphoproliferative disorder such as  lymphoma would deserve consideration, the appearance is suggestive  of sarcoidosis.      Assessment & Plan:

## 2013-08-08 NOTE — Assessment & Plan Note (Signed)
-  ESR =51  ACE 67  Main symptoms at this point are arthritis but the acute onset assoc with arthritis is actually favorable for a shorter course.  Explained the etiology and natural hx of sarcoid and for now do not rec bx (unless need to commit to longer rx or cxr/ symptoms worsen)  Discussed in detail all the  indications, usual  risks and alternatives  relative to the benefits with patient who agrees to proceed with pred x 6 days only and regroup in 2 weeks

## 2013-08-08 NOTE — Assessment & Plan Note (Signed)
Not Adequate control on present rx, reviewed options > for now rather than push lopressor higher, which risks spillover B2 effects, Strongly prefer in this setting: Bystolic, the most beta -1  selective Beta blocker available in sample form, with bisoprolol the most selective generic choice  on the market.   bystolic 10 mg twice daily for now and recheck in 2 weeks

## 2013-08-20 ENCOUNTER — Other Ambulatory Visit (INDEPENDENT_AMBULATORY_CARE_PROVIDER_SITE_OTHER): Payer: BC Managed Care – PPO

## 2013-08-20 ENCOUNTER — Ambulatory Visit (INDEPENDENT_AMBULATORY_CARE_PROVIDER_SITE_OTHER): Payer: BC Managed Care – PPO | Admitting: Internal Medicine

## 2013-08-20 ENCOUNTER — Ambulatory Visit (INDEPENDENT_AMBULATORY_CARE_PROVIDER_SITE_OTHER)
Admission: RE | Admit: 2013-08-20 | Discharge: 2013-08-20 | Disposition: A | Discharge status: Home / Self Care | Payer: BC Managed Care – PPO | Source: Ambulatory Visit | Attending: Internal Medicine | Admitting: Internal Medicine

## 2013-08-20 ENCOUNTER — Encounter: Payer: Self-pay | Admitting: Internal Medicine

## 2013-08-20 VITALS — BP 150/96 | HR 74 | Temp 97.4°F | Ht 74.0 in | Wt 197.8 lb

## 2013-08-20 DIAGNOSIS — I10 Essential (primary) hypertension: Secondary | ICD-10-CM

## 2013-08-20 DIAGNOSIS — D869 Sarcoidosis, unspecified: Secondary | ICD-10-CM

## 2013-08-20 LAB — BASIC METABOLIC PANEL
BUN: 20 mg/dL (ref 6–23)
CALCIUM: 9.1 mg/dL (ref 8.4–10.5)
CO2: 26 mEq/L (ref 19–32)
Chloride: 101 mEq/L (ref 96–112)
Creatinine, Ser: 1.4 mg/dL (ref 0.4–1.5)
GFR: 71.39 mL/min (ref >60.00)
Glucose, Bld: 93 mg/dL (ref 70–99)
POTASSIUM: 3.7 meq/L (ref 3.5–5.1)
SODIUM: 135 meq/L (ref 135–145)

## 2013-08-20 MED ORDER — FUROSEMIDE 20 MG PO TABS: 20.0000 mg | ORAL_TABLET | Freq: Every day | ORAL | Status: DC

## 2013-08-20 NOTE — Patient Instructions (Signed)
Add lasix 20 mg each am  Continue bystolic 5 mg each am   Please remember to go to the lab   department downstairs for your tests - we will call you with the results when they are available.    Please schedule a follow up office visit in 4 weeks, sooner if needed

## 2013-08-20 NOTE — Progress Notes (Signed)
Quick Note:  Spoke with pt and notified of results per Dr. Wert. Pt verbalized understanding and denied any questions.  ______ 

## 2013-08-20 NOTE — Progress Notes (Signed)
Subjective:    Patient ID: Ian Shade., male    DOB: 1966/04/07   MRN: [id number]    Brief patient profile:  36 yobm never regular smoker with HBP last well  Sept 2014 with acute onset flu like illness in Oct 2014 and saw UC Dec 2014 but no cxr dx then cxr done on return  06/08/13 with wt loss > cxr hilar adenopathy > CT 06/27/13. And referred to pulmonary clinic with ? Sarcoidosis 08/07/13     08/07/2013 1st [place] [facility] office visit/ Ian Gregory  Chief Complaint  Patient presents with  . Pulmonary Consult    Self referral for eval of abnormal CT Chest. He c/o fatigue and CP since Oct 2014. Went to ED in Jan with cough and fatigue and cxr was done, then CT done in Feb 2015.His CP is sharp and comes and goes.   joint problems main concern x 4 week esp feet and ankles but no rash, no fever, cough is better and Not limited by breathing from desired activities . No ex or pleuritic cp. rec Please remember to go to the lab  department downstairs for your tests - we will call you with the results when they are available.  Prednisone 10 mg take  4 each am x 2 days,   2 each am x 2 days,  1 each am x 2 days and stop  Stop lopressor Start bystolic 10 mg one twice daily in place of lopressor Please schedule a follow up office visit in 2 weeks, sooner if needed with cxr    08/20/2013 f/u ov/Ian Gregory re: sarcoid def better cough/cp on pred, one week later flared agin but tolerable Chief Complaint  Patient presents with  . Follow-up    Pt states CP and cough are unchanged since his last visit. No new co's today.    Not limited by breathing from desired activities     No obvious day to day or daytime variabilty or assoc  chest tightness, subjective wheeze overt sinus or hb symptoms. No unusual exp hx or h/o childhood pna/ asthma or knowledge of premature birth.  Sleeping ok without nocturnal  or early am exacerbation  of respiratory  c/o's or need for noct saba. Also denies any obvious fluctuation of  symptoms with weather or environmental changes or other aggravating or alleviating factors except as outlined above   Current Medications, Allergies, Complete Past Medical History, Past Surgical History, Family History, and Social History were reviewed in [employer] record.  ROS  The following are not active complaints unless bolded sore throat, dysphagia, dental problems, itching, sneezing,  nasal congestion or excess/ purulent secretions, ear ache,   fever, chills, sweats, unintended wt loss, pleuritic or exertional cp, hemoptysis,  orthopnea pnd or leg swelling, presyncope, palpitations, heartburn, abdominal pain, anorexia, nausea, vomiting, diarrhea  or change in bowel or urinary habits, change in stools or urine, dysuria,hematuria,  rash, arthralgias, visual complaints, headache, numbness weakness or ataxia or problems with walking or coordination,  change in mood/affect or memory.                       Objective:   Physical Exam  Wt Readings from Last 3 Encounters:  08/20/13 197 lb 12.8 oz (89.721 kg)  08/07/13 201 lb (91.173 kg)  06/08/13 189 lb 6 oz (85.9 kg)         HEENT: nl dentition, turbinates, and orophanx. Nl external ear canals without cough  reflex   NECK :  without JVD/Nodes/TM/ nl carotid upstrokes bilaterally   LUNGS: no acc muscle use, clear to A and P bilaterally without cough on insp or exp maneuvers   CV:  RRR  no s3 or murmur or increase in P2, no edema   ABD:  soft and nontender with nl excursion in the supine position. No bruits or organomegaly, bowel sounds nl  MS:  warm without deformities, calf tenderness, cyanosis or clubbing  SKIN: warm and dry without lesions    NEURO:  alert, approp, no deficits     CT  2/715 Bilateral hilar and mediastinal lymphadenopathy is identified.  Although the possibility of a lymphoproliferative disorder such as  lymphoma would deserve consideration, the appearance is suggestive  of  sarcoidosis.      Lab Results  Component Value Date   CREATININE 1.4 08/20/2013   CREATININE 1.80* 06/27/2013   CREATININE 1.69* 06/27/2013    Assessment & Plan:

## 2013-08-21 NOTE — Assessment & Plan Note (Signed)
Marginally Adequate control on present rx, reviewed > need to add lasix 20 mg daily as tendency to mild edema despite improvement in serum creatinine

## 2013-08-21 NOTE — Assessment & Plan Note (Signed)
-  onset of symptoms 02/2014  - ESR =51  ACE 67  08/07/2013   Definitely improved on steroids but reluctant to consider more longterm rx  Tissue dx and more convincing progression of dz  Discussed in detail all the  indications, usual  risks and alternatives  relative to the benefits with patient who agrees to proceed with conservative f/u in 4 weeks

## 2013-09-18 ENCOUNTER — Ambulatory Visit: Payer: BC Managed Care – PPO | Admitting: Internal Medicine

## 2013-09-25 ENCOUNTER — Ambulatory Visit (INDEPENDENT_AMBULATORY_CARE_PROVIDER_SITE_OTHER): Payer: BC Managed Care – PPO | Admitting: Internal Medicine

## 2013-09-25 ENCOUNTER — Encounter: Payer: Self-pay | Admitting: Internal Medicine

## 2013-09-25 VITALS — BP 128/80 | HR 81 | Temp 98.3°F | Ht 75.0 in | Wt 196.4 lb

## 2013-09-25 DIAGNOSIS — D860 Sarcoidosis of lung: Secondary | ICD-10-CM

## 2013-09-25 DIAGNOSIS — J99 Respiratory disorders in diseases classified elsewhere: Secondary | ICD-10-CM

## 2013-09-25 DIAGNOSIS — D869 Sarcoidosis, unspecified: Secondary | ICD-10-CM

## 2013-09-25 DIAGNOSIS — I10 Essential (primary) hypertension: Secondary | ICD-10-CM

## 2013-09-25 MED ORDER — NEBIVOLOL HCL 10 MG PO TABS: ORAL_TABLET | ORAL | Status: DC

## 2013-09-25 NOTE — Progress Notes (Signed)
Subjective:    Patient ID: Ian Shade., male    DOB: [birth year]   MRN: [id number]    Brief patient profile:  38 yobm never regular smoker with HBP last well  Sept 2014 with acute onset flu like illness in Oct 2014 and saw UC Dec 2014 but no cxr dx then cxr done on return  06/08/13 with wt loss > cxr hilar adenopathy > CT 06/27/13. And referred to pulmonary clinic with ? Sarcoidosis 08/07/13    History of Present Illness  08/07/2013 1st [facility] office visit/ Ryland Smoots  Chief Complaint  Patient presents with  . Pulmonary Consult    Self referral for eval of abnormal CT Chest. He c/o fatigue and CP since Oct 2014. Went to ED in Jan with cough and fatigue and cxr was done, then CT done in Feb 2015.His CP is sharp and comes and goes.   joint problems main concern x 4 week esp feet and ankles but no rash, no fever, cough is better and Not limited by breathing from desired activities . No ex or pleuritic cp. rec Prednisone 10 mg take  4 each am x 2 days,   2 each am x 2 days,  1 each am x 2 days and stop  Stop lopressor Start bystolic 10 mg one twice daily in place of lopressor Please schedule a follow up office visit in 2 weeks, sooner if needed with cxr    08/20/2013 f/u ov/Leyda Vanderwerf re: sarcoid def better cough/cp on pred, one week p off  flared again but tolerable Chief Complaint  Patient presents with  . Follow-up    Pt states CP and cough are unchanged since his last visit. No new co's today.  Not limited by breathing from desired activities   rec Add lasix 20 mg each am Continue bystolic 5 mg each am    [id number] f/u ov/Alexia Dinger re: prob sarcoid off pred since 08/13/2013  Chief Complaint  Patient presents with  . Follow-up    Cough has been better.  CP has pretty much resolved. No new co's today.        No obvious day to day or daytime variabilty or assoc  chest tightness, subjective wheeze overt sinus or hb symptoms. No unusual exp hx or h/o childhood pna/ asthma or knowledge of  premature birth.  Sleeping ok without nocturnal  or early am exacerbation  of respiratory  c/o's or need for noct saba. Also denies any obvious fluctuation of symptoms with weather or environmental changes or other aggravating or alleviating factors except as outlined above   Current Medications, Allergies, Complete Past Medical History, Past Surgical History, Family History, and Social History were reviewed in [employer] record.  ROS  The following are not active complaints unless bolded sore throat, dysphagia, dental problems, itching, sneezing,  nasal congestion or excess/ purulent secretions, ear ache,   fever, chills, sweats, unintended wt loss, pleuritic or exertional cp, hemoptysis,  orthopnea pnd or leg swelling, presyncope, palpitations, heartburn, abdominal pain, anorexia, nausea, vomiting, diarrhea  or change in bowel or urinary habits, change in stools or urine, dysuria,hematuria,  rash, arthralgias, visual complaints, headache, numbness weakness or ataxia or problems with walking or coordination,  change in mood/affect or memory.                     Objective:   Physical Exam   09/25/2013         196  Wt Readings  from Last 3 Encounters:  08/20/13 197 lb 12.8 oz (89.721 kg)  08/07/13 201 lb (91.173 kg)  06/08/13 189 lb 6 oz (85.9 kg)         HEENT: nl dentition, turbinates, and orophanx. Nl external ear canals without cough reflex   NECK :  without JVD/Nodes/TM/ nl carotid upstrokes bilaterally   LUNGS: no acc muscle use, clear to A and P bilaterally without cough on insp or exp maneuvers   CV:  RRR  no s3 or murmur or increase in P2, no edema   ABD:  soft and nontender with nl excursion in the supine position. No bruits or organomegaly, bowel sounds nl  MS:  warm without deformities, calf tenderness, cyanosis or clubbing  SKIN: warm and dry without lesions    NEURO:  alert, approp, no deficits     CT  2/715 Bilateral hilar and  mediastinal lymphadenopathy is identified.  Although the possibility of a lymphoproliferative disorder such as  lymphoma would deserve consideration, the appearance is suggestive  of sarcoidosis.      Lab Results  Component Value Date   CREATININE 1.4 08/20/2013   CREATININE 1.80* 06/27/2013   CREATININE 1.69* 06/27/2013    Assessment & Plan:

## 2013-09-25 NOTE — Patient Instructions (Addendum)
No change in medications  Please schedule a follow up visit in 3 months but call sooner if needed with cxr

## 2013-09-26 NOTE — Assessment & Plan Note (Signed)
-  onset of symptoms 02/2014 > last pred rx 08/13/13  - ESR =51  ACE 67  08/07/2013   No longer having any cough or sob, no systemic symptoms so reasonable to follow conservatively as this is clearly not lymphoma and so no need to bx at this point or rx in any way

## 2013-09-26 NOTE — Assessment & Plan Note (Signed)
Adequate control on present rx, reviewed > no change in rx needed   

## 2015-05-12 ENCOUNTER — Emergency Department (HOSPITAL_COMMUNITY): Payer: Commercial Managed Care - PPO

## 2015-05-12 ENCOUNTER — Inpatient Hospital Stay (HOSPITAL_COMMUNITY)
Admission: EM | Admit: 2015-05-12 | Discharge: 2015-05-20 | DRG: 193 | Disposition: A | Discharge status: Home / Self Care | Payer: Commercial Managed Care - PPO | Attending: Internal Medicine | Admitting: Internal Medicine

## 2015-05-12 ENCOUNTER — Encounter (HOSPITAL_COMMUNITY): Payer: Self-pay | Admitting: Emergency Medicine

## 2015-05-12 DIAGNOSIS — Z833 Family history of diabetes mellitus: Secondary | ICD-10-CM | POA: Diagnosis not present

## 2015-05-12 DIAGNOSIS — D631 Anemia in chronic kidney disease: Secondary | ICD-10-CM | POA: Diagnosis present

## 2015-05-12 DIAGNOSIS — J22 Unspecified acute lower respiratory infection: Secondary | ICD-10-CM | POA: Diagnosis present

## 2015-05-12 DIAGNOSIS — J9601 Acute respiratory failure with hypoxia: Secondary | ICD-10-CM | POA: Diagnosis present

## 2015-05-12 DIAGNOSIS — I471 Supraventricular tachycardia: Secondary | ICD-10-CM | POA: Diagnosis present

## 2015-05-12 DIAGNOSIS — D696 Thrombocytopenia, unspecified: Secondary | ICD-10-CM | POA: Diagnosis present

## 2015-05-12 DIAGNOSIS — Z9114 Patient's other noncompliance with medication regimen: Secondary | ICD-10-CM | POA: Diagnosis not present

## 2015-05-12 DIAGNOSIS — D860 Sarcoidosis of lung: Secondary | ICD-10-CM | POA: Diagnosis present

## 2015-05-12 DIAGNOSIS — N179 Acute kidney failure, unspecified: Secondary | ICD-10-CM | POA: Diagnosis present

## 2015-05-12 DIAGNOSIS — I1310 Hypertensive heart and chronic kidney disease without heart failure, with stage 1 through stage 4 chronic kidney disease, or unspecified chronic kidney disease: Secondary | ICD-10-CM | POA: Diagnosis present

## 2015-05-12 DIAGNOSIS — I161 Hypertensive emergency: Secondary | ICD-10-CM | POA: Diagnosis present

## 2015-05-12 DIAGNOSIS — Z8249 Family history of ischemic heart disease and other diseases of the circulatory system: Secondary | ICD-10-CM | POA: Diagnosis not present

## 2015-05-12 DIAGNOSIS — R0602 Shortness of breath: Secondary | ICD-10-CM | POA: Diagnosis not present

## 2015-05-12 DIAGNOSIS — J181 Lobar pneumonia, unspecified organism: Secondary | ICD-10-CM | POA: Diagnosis present

## 2015-05-12 DIAGNOSIS — J189 Pneumonia, unspecified organism: Principal | ICD-10-CM | POA: Diagnosis present

## 2015-05-12 DIAGNOSIS — N183 Chronic kidney disease, stage 3 unspecified: Secondary | ICD-10-CM | POA: Diagnosis present

## 2015-05-12 DIAGNOSIS — I10 Essential (primary) hypertension: Secondary | ICD-10-CM | POA: Diagnosis present

## 2015-05-12 DIAGNOSIS — I169 Hypertensive crisis, unspecified: Secondary | ICD-10-CM | POA: Insufficient documentation

## 2015-05-12 DIAGNOSIS — J988 Other specified respiratory disorders: Secondary | ICD-10-CM | POA: Diagnosis not present

## 2015-05-12 DIAGNOSIS — I509 Heart failure, unspecified: Secondary | ICD-10-CM | POA: Diagnosis not present

## 2015-05-12 DIAGNOSIS — R6510 Systemic inflammatory response syndrome (SIRS) of non-infectious origin without acute organ dysfunction: Secondary | ICD-10-CM

## 2015-05-12 HISTORY — DX: Essential (primary) hypertension: I10

## 2015-05-12 HISTORY — DX: Chronic kidney disease, stage 3 unspecified: N18.30

## 2015-05-12 HISTORY — DX: Sarcoidosis, unspecified: D86.9

## 2015-05-12 HISTORY — DX: Chronic kidney disease, stage 3 (moderate): N18.3

## 2015-05-12 LAB — I-STAT CHEM 8, ED
BUN: 22 mg/dL — AB (ref 6–20)
CALCIUM ION: 1.15 mmol/L (ref 1.12–1.23)
CHLORIDE: 103 mmol/L (ref 101–111)
CREATININE: 1.7 mg/dL — AB (ref 0.61–1.24)
GLUCOSE: 107 mg/dL — AB (ref 65–99)
HCT: 42 % (ref 39.0–52.0)
Hemoglobin: 14.3 g/dL (ref 13.0–17.0)
POTASSIUM: 4.2 mmol/L (ref 3.5–5.1)
Sodium: 138 mmol/L (ref 135–145)
TCO2: 23 mmol/L (ref 0–100)

## 2015-05-12 LAB — CBC WITH DIFFERENTIAL/PLATELET
BASOS ABS: 0 10*3/uL (ref 0.0–0.1)
Basophils Absolute: 0 10*3/uL (ref 0.0–0.1)
Basophils Relative: 0 %
Basophils Relative: 0 %
EOS ABS: 0.1 10*3/uL (ref 0.0–0.7)
EOS PCT: 2 %
Eosinophils Absolute: 0.2 10*3/uL (ref 0.0–0.7)
Eosinophils Relative: 2 %
HEMATOCRIT: 40.2 % (ref 39.0–52.0)
HEMATOCRIT: 36.9 % — AB (ref 39.0–52.0)
HEMOGLOBIN: 11.9 g/dL — AB (ref 13.0–17.0)
Hemoglobin: 13.3 g/dL (ref 13.0–17.0)
LYMPHS ABS: 0.9 10*3/uL (ref 0.7–4.0)
LYMPHS ABS: 0.7 10*3/uL (ref 0.7–4.0)
LYMPHS PCT: 11 %
LYMPHS PCT: 10 %
MCH: 30.2 pg (ref 26.0–34.0)
MCH: 29.8 pg (ref 26.0–34.0)
MCHC: 33.1 g/dL (ref 30.0–36.0)
MCHC: 32.2 g/dL (ref 30.0–36.0)
MCV: 91.2 fL (ref 78.0–100.0)
MCV: 92.5 fL (ref 78.0–100.0)
MONOS PCT: 17 %
Monocytes Absolute: 0.9 10*3/uL (ref 0.1–1.0)
Monocytes Absolute: 1.3 10*3/uL — ABNORMAL HIGH (ref 0.1–1.0)
Monocytes Relative: 11 %
NEUTROS ABS: 6.4 10*3/uL (ref 1.7–7.7)
NEUTROS ABS: 5.2 10*3/uL (ref 1.7–7.7)
NEUTROS PCT: 71 %
Neutrophils Relative %: 76 %
PLATELETS: 179 10*3/uL (ref 150–400)
Platelets: 182 10*3/uL (ref 150–400)
RBC: 4.41 MIL/uL (ref 4.22–5.81)
RBC: 3.99 MIL/uL — AB (ref 4.22–5.81)
RDW: 14.3 % (ref 11.5–15.5)
RDW: 14.4 % (ref 11.5–15.5)
WBC: 8.4 10*3/uL (ref 4.0–10.5)
WBC: 7.3 10*3/uL (ref 4.0–10.5)

## 2015-05-12 LAB — COMPREHENSIVE METABOLIC PANEL
ALBUMIN: 3.3 g/dL — AB (ref 3.5–5.0)
ALT: 22 U/L (ref 17–63)
AST: 22 U/L (ref 15–41)
Alkaline Phosphatase: 63 U/L (ref 38–126)
Anion gap: 9 (ref 5–15)
BILIRUBIN TOTAL: 1.1 mg/dL (ref 0.3–1.2)
BUN: 22 mg/dL — AB (ref 6–20)
CO2: 23 mmol/L (ref 22–32)
Calcium: 8.4 mg/dL — ABNORMAL LOW (ref 8.9–10.3)
Chloride: 107 mmol/L (ref 101–111)
Creatinine, Ser: 1.85 mg/dL — ABNORMAL HIGH (ref 0.61–1.24)
GFR calc Af Amer: 48 mL/min — ABNORMAL LOW (ref >60)
GFR calc non Af Amer: 41 mL/min — ABNORMAL LOW (ref >60)
GLUCOSE: 114 mg/dL — AB (ref 65–99)
POTASSIUM: 4.1 mmol/L (ref 3.5–5.1)
SODIUM: 139 mmol/L (ref 135–145)
TOTAL PROTEIN: 7 g/dL (ref 6.5–8.1)

## 2015-05-12 LAB — BRAIN NATRIURETIC PEPTIDE: B Natriuretic Peptide: 199.4 pg/mL — ABNORMAL HIGH (ref 0.0–100.0)

## 2015-05-12 LAB — I-STAT TROPONIN, ED: Troponin i, poc: 0.04 ng/mL (ref 0.00–0.08)

## 2015-05-12 LAB — MRSA PCR SCREENING: MRSA by PCR: NEGATIVE

## 2015-05-12 LAB — PHOSPHORUS: Phosphorus: 2.3 mg/dL — ABNORMAL LOW (ref 2.5–4.6)

## 2015-05-12 LAB — MAGNESIUM: Magnesium: 1.7 mg/dL (ref 1.7–2.4)

## 2015-05-12 LAB — TSH: TSH: 0.936 u[IU]/mL (ref 0.350–4.500)

## 2015-05-12 LAB — GLUCOSE, CAPILLARY: Glucose-Capillary: 103 mg/dL — ABNORMAL HIGH (ref 65–99)

## 2015-05-12 MED ORDER — DEXTROSE 5 % IV SOLN
1.0000 g | Freq: Once | INTRAVENOUS | Status: AC
  Administered 2015-05-12: 1 g via INTRAVENOUS
  Filled 2015-05-12: qty 10

## 2015-05-12 MED ORDER — SODIUM CHLORIDE 0.9 % IV BOLUS (SEPSIS)
1000.0000 mL | Freq: Once | INTRAVENOUS | Status: AC
  Administered 2015-05-12: 1000 mL via INTRAVENOUS

## 2015-05-12 MED ORDER — ACETAMINOPHEN 650 MG RE SUPP: 650.0000 mg | Freq: Four times a day (QID) | RECTAL | Status: DC | PRN

## 2015-05-12 MED ORDER — LEVOFLOXACIN IN D5W 750 MG/150ML IV SOLN
750.0000 mg | INTRAVENOUS | Status: DC
  Administered 2015-05-13: 750 mg via INTRAVENOUS
  Filled 2015-05-12: qty 150

## 2015-05-12 MED ORDER — LEVALBUTEROL HCL 0.63 MG/3ML IN NEBU
0.6300 mg | INHALATION_SOLUTION | Freq: Once | RESPIRATORY_TRACT | Status: AC
  Administered 2015-05-12: 0.63 mg via RESPIRATORY_TRACT
  Filled 2015-05-12: qty 3

## 2015-05-12 MED ORDER — SODIUM CHLORIDE 0.9 % IJ SOLN
3.0000 mL | Freq: Two times a day (BID) | INTRAMUSCULAR | Status: DC
Start: 2015-05-12 — End: 2015-05-20
  Administered 2015-05-12 – 2015-05-19 (×15): 3 mL via INTRAVENOUS

## 2015-05-12 MED ORDER — SODIUM CHLORIDE 0.9 % IV SOLN
INTRAVENOUS | Status: DC
  Administered 2015-05-12: 09:00:00 via INTRAVENOUS

## 2015-05-12 MED ORDER — ONDANSETRON HCL 4 MG/2ML IJ SOLN
4.0000 mg | Freq: Four times a day (QID) | INTRAMUSCULAR | Status: DC | PRN
  Administered 2015-05-12: 4 mg via INTRAVENOUS
  Filled 2015-05-12: qty 2

## 2015-05-12 MED ORDER — HYDRALAZINE HCL 20 MG/ML IJ SOLN
10.0000 mg | Freq: Four times a day (QID) | INTRAMUSCULAR | Status: DC | PRN
  Administered 2015-05-12 – 2015-05-19 (×9): 10 mg via INTRAVENOUS
  Filled 2015-05-12 (×11): qty 1

## 2015-05-12 MED ORDER — IPRATROPIUM-ALBUTEROL 0.5-2.5 (3) MG/3ML IN SOLN
3.0000 mL | RESPIRATORY_TRACT | Status: DC | PRN
Start: 2015-05-12 — End: 2015-05-19

## 2015-05-12 MED ORDER — LEVALBUTEROL HCL 0.63 MG/3ML IN NEBU: 0.6300 mg | INHALATION_SOLUTION | RESPIRATORY_TRACT | Status: DC | PRN

## 2015-05-12 MED ORDER — LEVOFLOXACIN IN D5W 500 MG/100ML IV SOLN
500.0000 mg | Freq: Once | INTRAVENOUS | Status: AC
  Administered 2015-05-12: 500 mg via INTRAVENOUS
  Filled 2015-05-12: qty 100

## 2015-05-12 MED ORDER — LABETALOL HCL 5 MG/ML IV SOLN
20.0000 mg | Freq: Once | INTRAVENOUS | Status: AC
  Administered 2015-05-12: 20 mg via INTRAVENOUS
  Filled 2015-05-12: qty 4

## 2015-05-12 MED ORDER — ACETAMINOPHEN 325 MG PO TABS
650.0000 mg | ORAL_TABLET | Freq: Four times a day (QID) | ORAL | Status: DC | PRN
  Administered 2015-05-15 – 2015-05-17 (×4): 650 mg via ORAL
  Filled 2015-05-12 (×4): qty 2

## 2015-05-12 MED ORDER — LABETALOL HCL 5 MG/ML IV SOLN
0.5000 mg/min | INTRAVENOUS | Status: DC
  Administered 2015-05-12: 2.5 mg/min via INTRAVENOUS
  Administered 2015-05-12: 2.8 mg/min via INTRAVENOUS
  Administered 2015-05-12: 1 mg/min via INTRAVENOUS
  Administered 2015-05-12: 3 mg/min via INTRAVENOUS
  Administered 2015-05-12: 2.8 mg/min via INTRAVENOUS
  Administered 2015-05-13: 2 mg/min via INTRAVENOUS
  Administered 2015-05-13: 1.8 mg/min via INTRAVENOUS
  Administered 2015-05-13 (×3): 1.5 mg/min via INTRAVENOUS
  Administered 2015-05-13 – 2015-05-14 (×2): 1.8 mg/min via INTRAVENOUS
  Administered 2015-05-14 – 2015-05-15 (×2): 0.5 mg/min via INTRAVENOUS
  Filled 2015-05-12: qty 100
  Filled 2015-05-12: qty 80
  Filled 2015-05-12: qty 100
  Filled 2015-05-12 (×2): qty 80
  Filled 2015-05-12: qty 100
  Filled 2015-05-12: qty 20
  Filled 2015-05-12: qty 100
  Filled 2015-05-12: qty 80
  Filled 2015-05-12 (×2): qty 20
  Filled 2015-05-12 (×3): qty 100
  Filled 2015-05-12 (×3): qty 80

## 2015-05-12 MED ORDER — ONDANSETRON HCL 4 MG PO TABS: 4.0000 mg | ORAL_TABLET | Freq: Four times a day (QID) | ORAL | Status: DC | PRN

## 2015-05-12 NOTE — Progress Notes (Signed)
MD, Dr. Charlies Silvers notified regarding patient SBP greater than 177. Orders received at this time. Will continue to monitor.

## 2015-05-12 NOTE — ED Provider Notes (Signed)
CSN: [id number]     Arrival date & time 05/12/15  0202 History  By signing my name below, I, Evelene Croon, attest that this documentation has been prepared under the direction and in the presence of Patty Lopezgarcia, MD . Electronically Signed: Evelene Croon, Scribe. 05/12/2015. 3:52 AM.   Chief Complaint  Ian Gregory presents with  . Shortness of Breath; Hypertension     Flu/cold symptoms    Ian Gregory is a 49 y.o. male presenting with cough. The history is provided by the Ian Gregory. No language interpreter was used.  Cough Cough characteristics:  Productive Sputum characteristics:  Green and yellow Severity:  Moderate Timing:  Intermittent Progression:  Unchanged Chronicity:  New Smoker: no   Context: not with activity   Relieved by:  Nothing Worsened by:  Nothing tried Ineffective treatments:  None tried Associated symptoms: chest pain and shortness of breath   Associated symptoms: no chills, no fever and no sore throat   Risk factors: no recent travel     HPI Comments:  Ian Alling. is a 49 y.o. male with a history of HTN and sarcoidosis, who presents to the Emergency Department complaining of productive cough for 3 weeks with yellow/green sputum.  He reports associated mild SOB this AM, post-tussive vomiting, and CP with cough. He has taken dayquil without relief. He deneis fever, swelling in his BLE, recent long periods of immobilization, smoking hx.  Past Medical History  Diagnosis Date  . Hypertension    History reviewed. No pertinent past surgical history. Family History  Problem Relation Age of Onset  . Hypertension Mother   . Kidney failure Mother   . Diabetes Father   . Hypertension Father    Social History  Substance Use Topics  . Smoking status: Never Smoker   . Smokeless tobacco: Never Used  . Alcohol Use: Yes     Comment: beer on wkends    Review of Systems  Constitutional: Negative for fever and chills.  HENT: Negative for sore throat.   Respiratory:  Positive for cough and shortness of breath.   Cardiovascular: Positive for chest pain. Negative for palpitations and leg swelling.  Gastrointestinal: Positive for vomiting.  All other systems reviewed and are negative.   Allergies  Review of Ian Gregory's allergies indicates no known allergies.  Home Medications   Prior to Admission medications   Medication Sig Start Date End Date Taking? Authorizing Provider  ibuprofen (ADVIL,MOTRIN) 200 MG tablet Take 400 mg by mouth every 6 (six) hours as needed for headache, mild pain or moderate pain.   Yes Historical Provider, MD  lisinopril (PRINIVIL,ZESTRIL) 20 MG tablet Take 20 mg by mouth 2 (two) times daily.   Yes Historical Provider, MD  furosemide (LASIX) 20 MG tablet Take 1 tablet (20 mg total) by mouth daily. Ian Gregory not taking: Reported on 05/12/2015 08/20/13   Tanda Rockers, MD  nebivolol (BYSTOLIC) 10 MG tablet One twice daily Ian Gregory not taking: Reported on 05/12/2015 09/25/13   Tanda Rockers, MD   BP 232/167 mmHg  Pulse 124  Temp(Src) 98.3 F (36.8 C) (Oral)  Resp 16  SpO2 97% Physical Exam  Constitutional: He is oriented to person, place, and time. He appears well-developed and well-nourished. No distress.  HENT:  Head: Normocephalic and atraumatic.  Mouth/Throat: Oropharynx is clear and moist. No oropharyngeal exudate.  Moist mucous membranes   Eyes: Conjunctivae are normal. Pupils are equal, round, and reactive to light.  Neck: Normal range of motion. Neck supple. No JVD present.  No bruits Trachea midline  Cardiovascular: Regular rhythm and normal heart sounds.  Tachycardia present.   Pulmonary/Chest: Effort normal. No accessory muscle usage or stridor. No respiratory distress. He has decreased breath sounds. He has no wheezes.  Diminished breathe sounds   Abdominal: Soft. Bowel sounds are normal. He exhibits no distension. There is no tenderness. There is no rebound.  Musculoskeletal: Normal range of motion. He exhibits no  edema or tenderness.  Neurological: He is alert and oriented to person, place, and time. He has normal reflexes.  Skin: Skin is warm and dry.  Psychiatric: He has a normal mood and affect. His behavior is normal.  Nursing note and vitals reviewed.   ED Course  Procedures   DIAGNOSTIC STUDIES:  Oxygen Saturation is 97% on RA, normal by my interpretation.    COORDINATION OF CARE:  3:40 AM Discussed treatment plan with pt at bedside and pt agreed to plan.  Labs Review Labs Reviewed  CBC WITH DIFFERENTIAL/PLATELET  BRAIN NATRIURETIC PEPTIDE  I-STAT CHEM 8, ED  I-STAT TROPOININ, ED    Imaging Review Dg Chest 2 View  05/12/2015  CLINICAL DATA:  49 year old male with productive cough and chest tightness EXAM: CHEST  2 VIEW COMPARISON:  Chest radiograph dated 08/20/2013 FINDINGS: Two views of the chest demonstrate a focal area of haziness in the right infrahilar region concerning for pneumonia. There is right hilar prominence which may represent adenopathy. There is no pleural effusion or pneumothorax. Mild cardiomegaly. The osseous structures appear unremarkable. IMPRESSION: Right hilar hazy airspace opacity concerning for pneumonia. Clinical correlation and follow-up recommended. Right hilar adenopathy may be present. Electronically Signed   By: Anner Crete M.D.   On: 05/12/2015 03:45   I have personally reviewed and evaluated these images and lab results as part of my medical decision-making.   EKG Interpretation   Date/Time:  Thursday May 12 2015 03:00:00 EST Ventricular Rate:  113 PR Interval:  169 QRS Duration: 93 QT Interval:  348 QTC Calculation: 477 R Axis:   76 Text Interpretation:  Sinus tachycardia Left atrial enlargement Anterior  infarct, possibly acute Baseline wander in lead(s) I III aVL Confirmed by  [facility]  MD, Emmaline Kluver ([id number]) on 05/12/2015 3:04:09 AM      MDM   Final diagnoses:  None    Results for orders placed or performed during the  hospital encounter of 05/12/15  CBC with Differential/Platelet  Result Value Ref Range   WBC 8.4 4.0 - 10.5 K/uL   RBC 4.41 4.22 - 5.81 MIL/uL   Hemoglobin 13.3 13.0 - 17.0 g/dL   HCT 40.2 39.0 - 52.0 %   MCV 91.2 78.0 - 100.0 fL   MCH 30.2 26.0 - 34.0 pg   MCHC 33.1 30.0 - 36.0 g/dL   RDW 14.3 11.5 - 15.5 %   Platelets 179 150 - 400 K/uL   Neutrophils Relative % 76 %   Neutro Abs 6.4 1.7 - 7.7 K/uL   Lymphocytes Relative 11 %   Lymphs Abs 0.9 0.7 - 4.0 K/uL   Monocytes Relative 11 %   Monocytes Absolute 0.9 0.1 - 1.0 K/uL   Eosinophils Relative 2 %   Eosinophils Absolute 0.2 0.0 - 0.7 K/uL   Basophils Relative 0 %   Basophils Absolute 0.0 0.0 - 0.1 K/uL  Brain natriuretic peptide  Result Value Ref Range   B Natriuretic Peptide 199.4 (H) 0.0 - 100.0 pg/mL  I-Stat Chem 8, ED  Result Value Ref Range   Sodium 138 135 -  145 mmol/L   Potassium 4.2 3.5 - 5.1 mmol/L   Chloride 103 101 - 111 mmol/L   BUN 22 (H) 6 - 20 mg/dL   Creatinine, Ser 1.70 (H) 0.61 - 1.24 mg/dL   Glucose, Bld 107 (H) 65 - 99 mg/dL   Calcium, Ion 1.15 1.12 - 1.23 mmol/L   TCO2 23 0 - 100 mmol/L   Hemoglobin 14.3 13.0 - 17.0 g/dL   HCT 42.0 39.0 - 52.0 %  I-stat troponin, ED  Result Value Ref Range   Troponin i, poc 0.04 0.00 - 0.08 ng/mL   Comment 3           Dg Chest 2 View  05/12/2015  CLINICAL DATA:  49 year old male with productive cough and chest tightness EXAM: CHEST  2 VIEW COMPARISON:  Chest radiograph dated 08/20/2013 FINDINGS: Two views of the chest demonstrate a focal area of haziness in the right infrahilar region concerning for pneumonia. There is right hilar prominence which may represent adenopathy. There is no pleural effusion or pneumothorax. Mild cardiomegaly. The osseous structures appear unremarkable. IMPRESSION: Right hilar hazy airspace opacity concerning for pneumonia. Clinical correlation and follow-up recommended. Right hilar adenopathy may be present. Electronically Signed   By:  Anner Crete M.D.   On: 05/12/2015 03:45    Medications  labetalol (NORMODYNE,TRANDATE) 500 mg in dextrose 5 % 125 mL (4 mg/mL) infusion (2 mg/min Intravenous Rate/Dose Change 05/12/15 0735)  levofloxacin (LEVAQUIN) IVPB 500 mg (500 mg Intravenous New Bag/Given 05/12/15 0733)  labetalol (NORMODYNE,TRANDATE) injection 20 mg (20 mg Intravenous Given 05/12/15 0534)  sodium chloride 0.9 % bolus 1,000 mL (0 mLs Intravenous Stopped 05/12/15 0724)  cefTRIAXone (ROCEPHIN) 1 g in dextrose 5 % 50 mL IVPB (0 g Intravenous Stopped 05/12/15 0716)  levalbuterol (XOPENEX) nebulizer solution 0.63 mg (0.63 mg Nebulization Given 05/12/15 0641)   MDM Reviewed: nursing note and vitals Reviewed previous: labs Interpretation: labs, ECG and x-ray (elevated BUN and creatinine, negative troponin No pulmonary edema on CXR by me) Total time providing critical care: 30-74 minutes. This excludes time spent performing separately reportable procedures and services. Consults: admitting MD  CRITICAL CARE Performed by: Carlisle Beers Total critical care time: 60 minutes Critical care time was exclusive of separately billable procedures and treating other patients. Critical care was necessary to treat or prevent imminent or life-threatening deterioration. Critical care was time spent personally by me on the following activities: development of treatment plan with Ian Gregory and/or surrogate as well as nursing, discussions with consultants, evaluation of Ian Gregory's response to treatment, examination of Ian Gregory, obtaining history from Ian Gregory or surrogate, ordering and performing treatments and interventions, ordering and review of laboratory studies, ordering and review of radiographic studies, pulse oximetry and re-evaluation of Ian Gregory's condition.   I personally performed the services described in this documentation, which was scribed in my presence. The recorded information has been reviewed and is accurate.       Veatrice Kells, MD 05/12/15 [phone]

## 2015-05-12 NOTE — H&P (Addendum)
[facility] History and Physical  Ian Gregory. [id number] DOB: 04-18-1966 DOA: 05/12/2015  Referring physician: ER physician: Dr. Veatrice Kells  PCP: No PCP Per Patient - will set up with [facility] on discharge   Chief Complaint: cough, shortness of breath   HPI:  49 year old male with past medical history of sarcoidosis and hypertension, not compliant with meds due to limited financial resources (no insurance). He presented to [facility] ED with reports of worsening shortness of breath and cough productive of yellowish sputum for past week or so prior to this admission. Pt repots he has had symptoms for longer than that but they got worse over last week. He does not use oxygen at home. He does not use nebs at home. Shortness of breath was present at rest and with exertion. No blood in sputum. He had subjective fevers and chills. No chest pain, no palpitations. No abdominal pain. He had vomiting after he has had ongoing cough. No nausea. No blood ins tool or urine. No diarrhea or constipation. No lightheadedness. No sick contacts.  In ED, BP was 232/167, HR 96-124, RR 16-29, afebrile and oxygen saturation 93% with [place] oxygen support. He was started on labetalol drip and his BP came down to 184/131. CXR showed right hilar hazy airspace opacity concernign for pneumonia. He was given empiric Levaquin. He was admitted to SDU due to accelerated hypertension.   Assessment & Plan    Principal Problem:   Acute respiratory failure with hypoxemia (HCC) / Lobar pneumonia - Hypoxia likely due to pneumonia  - CXR on admission right hilar hazy airspace opacity concernign for pneumonia. - Pneumonia order set placed - Follow up legionella and strep pneumonia results - F/U resp culture results - Stable resp status  Active Problems:   SIRS (systemic inflammatory response syndrome) (HCC) - SIRS criteria: tachycardia, tachypnea. No fever, no leukocytosis - CXR with possible pneumonia - On Levaquin  empirically     Accelerated hypertension - Controlled with labetalol drip and PRN hydralazine     Acute renal failure superimposed on stage 3 chronic kidney disease (HCC) - Baseline Cr 1.6 (about year ago) - Cr on this admissions slightly above baseline -? Lisinopril although pt not compliant with meds    Anemia of chronic renal failure, stage 3 (moderate) - Stable hemoglobin     Pulmonary sarcoidosis/clinical dx only  - Will treat his current resp status as pneumonia unless he worsens clinically then  will add steroids    DVT prophylaxis:  - SCD's bilaterally   Radiological Exams on Admission: Dg Chest 2 View 05/12/2015   Right hilar hazy airspace opacity concerning for pneumonia. Clinical correlation and follow-up recommended. Right hilar adenopathy may be present. Electronically Signed   By: Anner Crete M.D.   On: 05/12/2015 03:45    Code Status: Full Family Communication: Plan of care discussed with the patient  Disposition Plan: Admit for further evaluation, telemetry admission   Leisa Lenz, MD  [facility] Pager [phone]  Time spent in minutes: 75 minutes  Review of Systems:  Constitutional: Negative for fever, chills and malaise/fatigue. Negative for diaphoresis.  HENT: Negative for hearing loss, ear pain, nosebleeds, congestion, sore throat, neck pain, tinnitus and ear discharge.   Eyes: Negative for blurred vision, double vision, photophobia, pain, discharge and redness.  Respiratory: per HPI Cardiovascular: Negative for palpitations, orthopnea, claudication and leg swelling.  Gastrointestinal: Negative for nausea, vomiting and abdominal pain. Negative for heartburn, constipation, blood in stool and melena.  Genitourinary: Negative for dysuria, urgency, frequency, hematuria and flank pain.  Musculoskeletal: Negative for myalgias, back pain, joint pain and falls.  Skin: Negative for itching and rash.  Neurological: Negative for dizziness and weakness.  Negative for tingling, tremors, sensory change, speech change, focal weakness, loss of consciousness and headaches.  Endo/Heme/Allergies: Negative for environmental allergies and polydipsia. Does not bruise/bleed easily.  Psychiatric/Behavioral: Negative for suicidal ideas. The patient is not nervous/anxious.      History reviewed. No pertinent past medical history. History reviewed. No pertinent past surgical history. Social History:  reports that he has never smoked. He has never used smokeless tobacco. He reports that he drinks alcohol. He reports that he does not use illicit drugs.  No Known Allergies  Family History:  Family History  Problem Relation Age of Onset  . Hypertension Mother   . Kidney failure Mother   . Diabetes Father   . Hypertension Father      Prior to Admission medications   Medication Sig Start Date End Date Taking? Authorizing Provider  ibuprofen (ADVIL,MOTRIN) 200 MG tablet Take 400 mg by mouth every 6 (six) hours as needed for headache, mild pain or moderate pain.   Yes Historical Provider, MD  lisinopril (PRINIVIL,ZESTRIL) 20 MG tablet Take 20 mg by mouth 2 (two) times daily.   Yes Historical Provider, MD  furosemide (LASIX) 20 MG tablet Take 1 tablet (20 mg total) by mouth daily. Patient not taking: Reported on 05/12/2015 08/20/13   Tanda Rockers, MD  nebivolol (BYSTOLIC) 10 MG tablet One twice daily Patient not taking: Reported on 05/12/2015 09/25/13   Tanda Rockers, MD   Physical Exam: Filed Vitals:   05/12/15 1640 05/12/15 1700 05/12/15 1800 05/12/15 1820  BP: 134/94 122/85 139/95 143/106  Pulse: 77 75 79 81  Temp:      TempSrc:      Resp: 17 21 25 26   Height:      Weight:      SpO2: 97% 96% 96% 94%    Physical Exam  Constitutional: Appears well-developed and well-nourished. No distress.  HENT: Normocephalic. No tonsillar erythema or exudates Eyes: Conjunctivae are normal. No scleral icterus.  Neck: Normal ROM. Neck supple. No JVD. No  tracheal deviation. No thyromegaly.  CVS: tachycardic, appreciate S1, S2 .  Pulmonary: rhonchi on right side, no wheezing .  Abdominal: Soft. BS +,  no distension, tenderness, rebound or guarding.  Musculoskeletal: Normal range of motion. No edema and no tenderness.  Lymphadenopathy: No lymphadenopathy noted, cervical, inguinal. Neuro: Alert. Normal reflexes, muscle tone coordination. No focal neurologic deficits. Skin: Skin is warm and dry. No rash noted.  No erythema. No pallor.  Psychiatric: Normal mood and affect. Behavior, judgment, thought content normal.   Labs on Admission:  Basic Metabolic Panel:  Recent Labs Lab 05/12/15 0441 05/12/15 0938  NA 138 139  K 4.2 4.1  CL 103 107  CO2  --  23  GLUCOSE 107* 114*  BUN 22* 22*  CREATININE 1.70* 1.85*  CALCIUM  --  8.4*  MG  --  1.7  PHOS  --  2.3*   Liver Function Tests:  Recent Labs Lab 05/12/15 0938  AST 22  ALT 22  ALKPHOS 63  BILITOT 1.1  PROT 7.0  ALBUMIN 3.3*   No results for input(s): LIPASE, AMYLASE in the last 168 hours. No results for input(s): AMMONIA in the last 168 hours. CBC:  Recent Labs Lab 05/12/15 0303 05/12/15 0441 05/12/15 0938  WBC 8.4  --  7.3  NEUTROABS 6.4  --  5.2  HGB 13.3 14.3 11.9*  HCT 40.2 42.0 36.9*  MCV 91.2  --  92.5  PLT 179  --  182   Cardiac Enzymes: No results for input(s): CKTOTAL, CKMB, CKMBINDEX, TROPONINI in the last 168 hours. BNP: Invalid input(s): POCBNP CBG:  Recent Labs Lab 05/12/15 0953  GLUCAP 103*    If 7PM-7AM, please contact night-coverage [email or web address] Password [facility] 05/12/2015, 6:55 PM

## 2015-05-12 NOTE — ED Notes (Signed)
20 minute notice called to ICU Charge nurse

## 2015-05-12 NOTE — Progress Notes (Signed)
ANTIBIOTIC CONSULT NOTE - INITIAL  Pharmacy Consult for levofloxacin Indication: CAP  No Known Allergies  Patient Measurements: Height: 6\' 3"  (190.5 cm) Weight: 218 lb 14.7 oz (99.3 kg) IBW/kg (Calculated) : 84.5   Vital Signs: Temp: 97.7 F (36.5 C) (12/22 1600) Temp Source: Oral (12/22 1600) BP: 143/106 mmHg (12/22 1820) Pulse Rate: 81 (12/22 1820) Intake/Output from previous day:   Intake/Output from this shift:    Labs:  Recent Labs  05/12/15 0303 05/12/15 0441 05/12/15 0938  WBC 8.4  --  7.3  HGB 13.3 14.3 11.9*  PLT 179  --  182  CREATININE  --  1.70* 1.85*   Estimated Creatinine Clearance: 57.7 mL/min (by C-G formula based on Cr of 1.85). No results for input(s): VANCOTROUGH, VANCOPEAK, VANCORANDOM, GENTTROUGH, GENTPEAK, GENTRANDOM, TOBRATROUGH, TOBRAPEAK, TOBRARND, AMIKACINPEAK, AMIKACINTROU, AMIKACIN in the last 72 hours.   Microbiology: Recent Results (from the past 720 hour(s))  MRSA PCR Screening     Status: None   Collection Time: 05/12/15  9:27 AM  Result Value Ref Range Status   MRSA by PCR NEGATIVE NEGATIVE Final    Comment:        The GeneXpert MRSA Assay (FDA approved for NASAL specimens only), is one component of a comprehensive MRSA colonization surveillance program. It is not intended to diagnose MRSA infection nor to guide or monitor treatment for MRSA infections.     Medical History: History reviewed. No pertinent past medical history.   Assessment: 49 y.o. male with a history of HTN and sarcoidosis, who presents to the Emergency Department complaining of productive cough for 3 weeks with yellow/green sputum.   Goal of Therapy:  levaquin per renal function and indication  Plan:  levaquin 750mg  IV q24 hours for CrCl >61mls/ml Follow renal function and cultures  Dolly Rias RPh 05/12/2015, 7:08 PM Pager [phone]

## 2015-05-12 NOTE — ED Notes (Addendum)
Pt presents stating he came in because he was having a harder time catching his breath; pt reports last time he had SOB he was dx with sarcoidosis but does not take any medication for it presently; pt also co nasal congestion with productive cough (yellow/green phlegm), vomiting X1 today yet denies nausea; pt also reports mild body aches and chills, and pressure in his head; pt says he has taken Nyquil and Dayquil with no effect other than sleepiness; pt also with hx HTN says he has not taken his Lisinopril today

## 2015-05-12 NOTE — ED Notes (Signed)
Patient c/o SOB, productive cough with yellow/green sputum, emesis, CP, HA x3 weeks, intermittently. Rates pain 6/10. Denies double/blurred vision, fever/chills, or diarrhea.

## 2015-05-13 DIAGNOSIS — D696 Thrombocytopenia, unspecified: Secondary | ICD-10-CM | POA: Diagnosis not present

## 2015-05-13 LAB — BASIC METABOLIC PANEL
ANION GAP: 9 (ref 5–15)
BUN: 24 mg/dL — ABNORMAL HIGH (ref 6–20)
CHLORIDE: 107 mmol/L (ref 101–111)
CO2: 23 mmol/L (ref 22–32)
CREATININE: 2.21 mg/dL — AB (ref 0.61–1.24)
Calcium: 9.2 mg/dL (ref 8.9–10.3)
GFR calc non Af Amer: 33 mL/min — ABNORMAL LOW (ref >60)
GFR, EST AFRICAN AMERICAN: 38 mL/min — AB (ref >60)
Glucose, Bld: 120 mg/dL — ABNORMAL HIGH (ref 65–99)
POTASSIUM: 4.1 mmol/L (ref 3.5–5.1)
SODIUM: 139 mmol/L (ref 135–145)

## 2015-05-13 LAB — CBC
HCT: 34.1 % — ABNORMAL LOW (ref 39.0–52.0)
HEMOGLOBIN: 11.1 g/dL — AB (ref 13.0–17.0)
MCH: 29.8 pg (ref 26.0–34.0)
MCHC: 32.6 g/dL (ref 30.0–36.0)
MCV: 91.4 fL (ref 78.0–100.0)
PLATELETS: 144 10*3/uL — AB (ref 150–400)
RBC: 3.73 MIL/uL — AB (ref 4.22–5.81)
RDW: 14.6 % (ref 11.5–15.5)
WBC: 5.6 10*3/uL (ref 4.0–10.5)

## 2015-05-13 LAB — EXPECTORATED SPUTUM ASSESSMENT W REFEX TO RESP CULTURE

## 2015-05-13 LAB — STREP PNEUMONIAE URINARY ANTIGEN: STREP PNEUMO URINARY ANTIGEN: NEGATIVE

## 2015-05-13 LAB — EXPECTORATED SPUTUM ASSESSMENT W GRAM STAIN, RFLX TO RESP C

## 2015-05-13 MED ORDER — SODIUM CHLORIDE 0.9 % IV SOLN
INTRAVENOUS | Status: DC
  Administered 2015-05-13 – 2015-05-14 (×2): via INTRAVENOUS

## 2015-05-13 MED ORDER — GUAIFENESIN-DM 100-10 MG/5ML PO SYRP
5.0000 mL | ORAL_SOLUTION | ORAL | Status: DC | PRN
  Administered 2015-05-13 – 2015-05-16 (×7): 5 mL via ORAL
  Filled 2015-05-13 (×7): qty 10

## 2015-05-13 MED ORDER — DILTIAZEM HCL 30 MG PO TABS
30.0000 mg | ORAL_TABLET | Freq: Four times a day (QID) | ORAL | Status: DC
  Administered 2015-05-13 – 2015-05-15 (×8): 30 mg via ORAL
  Filled 2015-05-13 (×8): qty 1

## 2015-05-13 NOTE — Progress Notes (Addendum)
Patient ID: Ian Shade., male   DOB: June 23, 1965, 49 y.o.   MRN: [id number] [facility] PROGRESS NOTE  Ian Shade. [id number] DOB: Jan 29, 1966 DOA: 05/12/2015 PCP: No PCP Per Patient will set up with [facility] on discharge   Brief narrative:    49 year old male with past medical history of sarcoidosis and hypertension, not compliant with meds due to limited financial resources (no insurance). He presented to [facility] ED with reports of worsening shortness of breath and cough productive of yellowish sputum for past week or so prior to this admission. Shortness of breath was present at rest and with exertion.   In ED, BP was 232/167, HR 96-124, RR 16-29, afebrile and oxygen saturation 93% with [place] oxygen support. He was started on labetalol drip and his BP came down to 184/131. CXR showed right hilar hazy airspace opacity concernign for pneumonia. He was started on empiric Levaquin. He was admitted to SDU due to accelerated hypertension.   Assessment/Plan:    Principal Problem:  Acute respiratory failure with hypoxemia (HCC) / Lobar pneumonia due to unspecified organism - Hypoxia is mild and likely from pneumonia - CXR on admission right hilar hazy airspace opacity concernign for pneumonia. - His resp status is stable this am - Strep pneumonia is negative - Legionella and resp culture is pending   Active Problems:  SIRS (systemic inflammatory response syndrome) (HCC) - SIRS criteria were met on admission with tachycardia, tachypnea.  - Presumed source of infection is pneumonia.  - Continue Levaquin    Accelerated hypertension - Continue labetalol drip and as needed hydralazine - BP now 156/117   Acute renal failure superimposed on stage 3 chronic kidney disease (HCC) - Baseline Cr 1.6 (about year ago) - Cr on this admission 1.8 and this am 2.22 - Not clear why it is trending up, he was not very compliant with lisinopril at home and at this time not on any nephrotoxic  meds - Will start low rate IV Fluids and see if Cr improves in am   Anemia of chronic renal failure, stage 3 (moderate) - Stable hemoglobin at 11.1 - No reports of bleeding   Pulmonary sarcoidosis/clinical dx only  - Stable resp status    Thrombocytopenia - Unclear etiology - Platelets 182 --> 144 - Will follow up CBC in am  DVT prophylaxis:  - SCD's bilaterally in hospital   Code Status: Full.  Family Communication:  plan of care discussed with the patient Disposition Plan: remains in SDU since he is on labetalol drip.   IV access:  Peripheral IV  Procedures and diagnostic studies:    Dg Chest 2 View 05/12/2015 Right hilar hazy airspace opacity concerning for pneumonia. Clinical correlation and follow-up recommended. Right hilar adenopathy may be present. Electronically Signed   By: Anner Crete M.D.   On: 05/12/2015 03:45   Medical Consultants:  None   Other Consultants:  None   IAnti-Infectives:   None    Leisa Lenz, MD  [facility] Pager [phone]  Time spent in minutes: 25 minutes  If 7PM-7AM, please contact night-coverage [email or web address] Password [email or web address] 05/13/2015, 10:20 AM   LOS: 1 day    HPI/Subjective: No acute overnight events. Patient reports feeling tired.   Objective: Filed Vitals:   05/13/15 0815 05/13/15 0830 05/13/15 0900 05/13/15 0930  BP:  147/106 157/110 156/117  Pulse:  78 80 76  Temp: 97.6 F (36.4 C)     TempSrc: Oral     Resp:  $'21 21 23  'h$ Height:      Weight:      SpO2:  95% 94% 95%    Intake/Output Summary (Last 24 hours) at 05/13/15 1020 Last data filed at 05/13/15 0900  Gross per 24 hour  Intake 1020.16 ml  Output    250 ml  Net 770.16 ml    Exam:   General:  Pt is alert, follows commands appropriately, not in acute distress  Cardiovascular: Regular rate and rhythm, S1/S2 (+)  Respiratory: No wheezing, no crackles, no rhonchi  Abdomen: Soft, non tender, non distended, bowel sounds  present  Extremities: No edema, pulses DP and PT palpable bilaterally  Neuro: Grossly nonfocal  Data Reviewed: Basic Metabolic Panel:  Recent Labs Lab 05/12/15 0441 05/12/15 0938 05/13/15 0402  NA 138 139 139  K 4.2 4.1 4.1  CL 103 107 107  CO2  --  23 23  GLUCOSE 107* 114* 120*  BUN 22* 22* 24*  CREATININE 1.70* 1.85* 2.21*  CALCIUM  --  8.4* 9.2  MG  --  1.7  --   PHOS  --  2.3*  --    Liver Function Tests:  Recent Labs Lab 05/12/15 0938  AST 22  ALT 22  ALKPHOS 63  BILITOT 1.1  PROT 7.0  ALBUMIN 3.3*   No results for input(s): LIPASE, AMYLASE in the last 168 hours. No results for input(s): AMMONIA in the last 168 hours. CBC:  Recent Labs Lab 05/12/15 0303 05/12/15 0441 05/12/15 0938 05/13/15 0402  WBC 8.4  --  7.3 5.6  NEUTROABS 6.4  --  5.2  --   HGB 13.3 14.3 11.9* 11.1*  HCT 40.2 42.0 36.9* 34.1*  MCV 91.2  --  92.5 91.4  PLT 179  --  182 144*   Cardiac Enzymes: No results for input(s): CKTOTAL, CKMB, CKMBINDEX, TROPONINI in the last 168 hours. BNP: Invalid input(s): POCBNP CBG:  Recent Labs Lab 05/12/15 0953  GLUCAP 103*    Recent Results (from the past 240 hour(s))  MRSA PCR Screening     Status: None   Collection Time: 05/12/15  9:27 AM  Result Value Ref Range Status   MRSA by PCR NEGATIVE NEGATIVE Final  Culture, sputum-assessment     Status: None   Collection Time: 05/13/15  4:38 AM  Result Value Ref Range Status   Specimen Description SPUTUM  Final   Special Requests NONE  Final   Sputum evaluation   Final    THIS SPECIMEN IS ACCEPTABLE. RESPIRATORY CULTURE REPORT TO FOLLOW.   Report Status 05/13/2015 FINAL  Final     Scheduled Meds: . levofloxacin (LEVAQUIN) IV  750 mg Intravenous Q24H  . sodium chloride  3 mL Intravenous Q12H   Continuous Infusions: . sodium chloride 10 mL/hr at 05/12/15 1800  . sodium chloride    . labetalol (NORMODYNE) infusion 1.5 mg/min (05/13/15 0800)

## 2015-05-13 NOTE — Progress Notes (Signed)
[id number] Davis,RN,BSn,CCM:  Remains on the Labetalol infusion for hypertensive state.

## 2015-05-14 DIAGNOSIS — D696 Thrombocytopenia, unspecified: Secondary | ICD-10-CM

## 2015-05-14 LAB — BASIC METABOLIC PANEL
Anion gap: 8 (ref 5–15)
BUN: 24 mg/dL — AB (ref 6–20)
CALCIUM: 8.8 mg/dL — AB (ref 8.9–10.3)
CHLORIDE: 104 mmol/L (ref 101–111)
CO2: 21 mmol/L — AB (ref 22–32)
CREATININE: 2.31 mg/dL — AB (ref 0.61–1.24)
GFR calc non Af Amer: 31 mL/min — ABNORMAL LOW (ref >60)
GFR, EST AFRICAN AMERICAN: 36 mL/min — AB (ref >60)
GLUCOSE: 111 mg/dL — AB (ref 65–99)
Potassium: 4.3 mmol/L (ref 3.5–5.1)
Sodium: 133 mmol/L — ABNORMAL LOW (ref 135–145)

## 2015-05-14 LAB — CBC
HEMATOCRIT: 31.9 % — AB (ref 39.0–52.0)
HEMOGLOBIN: 10.4 g/dL — AB (ref 13.0–17.0)
MCH: 29.8 pg (ref 26.0–34.0)
MCHC: 32.6 g/dL (ref 30.0–36.0)
MCV: 91.4 fL (ref 78.0–100.0)
Platelets: 150 10*3/uL (ref 150–400)
RBC: 3.49 MIL/uL — ABNORMAL LOW (ref 4.22–5.81)
RDW: 14.5 % (ref 11.5–15.5)
WBC: 5.9 10*3/uL (ref 4.0–10.5)

## 2015-05-14 LAB — GLUCOSE, CAPILLARY: Glucose-Capillary: 100 mg/dL — ABNORMAL HIGH (ref 65–99)

## 2015-05-14 MED ORDER — LEVOFLOXACIN IN D5W 500 MG/100ML IV SOLN
500.0000 mg | INTRAVENOUS | Status: DC
  Administered 2015-05-14 – 2015-05-15 (×2): 500 mg via INTRAVENOUS
  Filled 2015-05-14 (×2): qty 100

## 2015-05-14 MED ORDER — DM-GUAIFENESIN ER 30-600 MG PO TB12
1.0000 | ORAL_TABLET | Freq: Two times a day (BID) | ORAL | Status: DC
  Administered 2015-05-14 – 2015-05-18 (×10): 1 via ORAL
  Filled 2015-05-14 (×11): qty 1

## 2015-05-14 NOTE — Progress Notes (Signed)
Pharmacy Brief Note:  Levaquin Dosing  D#3 Levaquin 750 mg IV q24h for pneumonia  Tm 99.7 SCr 2.31, steadily rising since admission. Estimated CrCl 46 mL/min WBC 5.6K  12/23 Sputum culture: in process (GPC in pairs on gram statin) 12/23 S.pneumoniae UAg: negative 12/23 Legionella UAg: in process  A:  AoKD worsening, with estimated CrCl < 50 mL/min  P;  Reduce Levaquin to 500 mg IV q24h Follow culture results, renal function, clinical course.  Clayburn Pert, PharmD, BCPS Pager: [phone] 05/14/2015  5:29 AM

## 2015-05-14 NOTE — Progress Notes (Signed)
Patient ID: Ian Gregory., male   DOB: [birth year], 49 y.o.   MRN: [id number] [facility] PROGRESS NOTE  Ian Gregory. [id number] DOB: 1966-01-10 DOA: 05/12/2015 PCP: No PCP Per Patient will set up with [facility] on discharge   Brief narrative:    49 year old male with past medical history of sarcoidosis and hypertension, not compliant with meds due to limited financial resources (no insurance). He presented to [facility] ED with reports of worsening shortness of breath and cough productive of yellowish sputum for past week or so prior to this admission. Shortness of breath was present at rest and with exertion.   In ED, BP was 232/167, HR 96-124, RR 16-29, afebrile and oxygen saturation 93% with [place] oxygen support. He was started on labetalol drip and his BP came down to 184/131. CXR showed right hilar hazy airspace opacity concernign for pneumonia. He was started on empiric Levaquin. He was admitted to SDU due to accelerated hypertension.    Assessment/Plan:    Principal Problem:  Acute respiratory failure with hypoxemia (HCC) / Lobar pneumonia due to unspecified organism - Likely from pneumonia - CXR on admission demonstrated right hilar hazy airspace opacity concernign for pneumonia. - Strep pneumonia is negative - Legionella and resp culture still pending as of 12/24 - Continue anti-tussives as needed   Active Problems:  SIRS (systemic inflammatory response syndrome) (HCC) - SIRS criteria were met on admission with tachycardia, tachypnea. Suspected infection is pneumonia.  - Continue Levaquin daily    Accelerated hypertension - Continue labetalol drip, taper down as we have started Cardizem 30 mg PO Q 6 hours 12/23   Acute renal failure superimposed on stage 3 chronic kidney disease (HCC) - Baseline Cr 1.6 (about year ago) - Cr on this admission 1.8 and still up this am 2.31 - Continue to monitor renal function - Not on any nephrotoxic agent so far unless he was taking  lisinopril (however he is ot sure which meds he took at home)   Anemia of chronic renal failure, stage 3 (moderate) - Stable hemoglobin - No current requirements for transfusion    Pulmonary sarcoidosis/clinical dx only  - Stable     Thrombocytopenia - Unclear etiology - Platelets 182 --> 144 --> 150  DVT prophylaxis:  - SCD's bilaterally   Code Status: Full.  Family Communication:  plan of care discussed with the patient Disposition Plan: remain SDU since sill on labetalol drip.   IV access:  Peripheral IV  Procedures and diagnostic studies:    Dg Chest 2 View 05/12/2015 Right hilar hazy airspace opacity concerning for pneumonia. Clinical correlation and follow-up recommended. Right hilar adenopathy may be present. Electronically Signed   By: Anner Crete M.D.   On: 05/12/2015 03:45   Medical Consultants:  None   Other Consultants:  None   IAnti-Infectives:   Levaquin 05/12/2015 -->   Leisa Lenz, MD  [facility] Pager [phone]  Time spent in minutes: 25 minutes  If 7PM-7AM, please contact night-coverage [email or web address] Password [facility] 05/14/2015, 12:18 PM   LOS: 2 days    HPI/Subjective: No acute overnight events. Patient reports coughing a lot.   Objective: Filed Vitals:   05/14/15 0730 05/14/15 0800 05/14/15 0813 05/14/15 0830  BP: 144/105 148/106  144/104  Pulse: 68 73  75  Temp:   98.5 F (36.9 C)   TempSrc:      Resp: '30 25  21  '$ Height:      Weight:  SpO2: 96% 94%  95%    Intake/Output Summary (Last 24 hours) at 05/14/15 1218 Last data filed at 05/14/15 0800  Gross per 24 hour  Intake 1598.78 ml  Output    750 ml  Net 848.78 ml    Exam:   General:  Pt is alert, not in acute distress  Cardiovascular: Rate controlled, (+) S1, S2   Respiratory: Bilateral air entry but coarse breath sounds, no wheezing    Abdomen: (+) BS, non tender   Extremities: No swelling, palpable pulses   Neuro: No focal deficits   Data  Reviewed: Basic Metabolic Panel:  Recent Labs Lab 05/12/15 0441 05/12/15 0938 05/13/15 0402 05/14/15 0332  NA 138 139 139 133*  K 4.2 4.1 4.1 4.3  CL 103 107 107 104  CO2  --  23 23 21*  GLUCOSE 107* 114* 120* 111*  BUN 22* 22* 24* 24*  CREATININE 1.70* 1.85* 2.21* 2.31*  CALCIUM  --  8.4* 9.2 8.8*  MG  --  1.7  --   --   PHOS  --  2.3*  --   --    Liver Function Tests:  Recent Labs Lab 05/12/15 0938  AST 22  ALT 22  ALKPHOS 63  BILITOT 1.1  PROT 7.0  ALBUMIN 3.3*   No results for input(s): LIPASE, AMYLASE in the last 168 hours. No results for input(s): AMMONIA in the last 168 hours. CBC:  Recent Labs Lab 05/12/15 0303 05/12/15 0441 05/12/15 0938 05/13/15 0402 05/14/15 0332  WBC 8.4  --  7.3 5.6 5.9  NEUTROABS 6.4  --  5.2  --   --   HGB 13.3 14.3 11.9* 11.1* 10.4*  HCT 40.2 42.0 36.9* 34.1* 31.9*  MCV 91.2  --  92.5 91.4 91.4  PLT 179  --  182 144* 150   Cardiac Enzymes: No results for input(s): CKTOTAL, CKMB, CKMBINDEX, TROPONINI in the last 168 hours. BNP: Invalid input(s): POCBNP CBG:  Recent Labs Lab 05/12/15 0953 05/14/15 0745  GLUCAP 103* 100*    Recent Results (from the past 240 hour(s))  MRSA PCR Screening     Status: None   Collection Time: 05/12/15  9:27 AM  Result Value Ref Range Status   MRSA by PCR NEGATIVE NEGATIVE Final  Culture, sputum-assessment     Status: None   Collection Time: 05/13/15  4:38 AM  Result Value Ref Range Status   Specimen Description SPUTUM  Final   Special Requests NONE  Final   Sputum evaluation   Final    THIS SPECIMEN IS ACCEPTABLE. RESPIRATORY CULTURE REPORT TO FOLLOW.   Report Status 05/13/2015 FINAL  Final     Scheduled Meds: . diltiazem  30 mg Oral 4 times per day  . levofloxacin (LEVAQUIN) IV  500 mg Intravenous Q24H  . sodium chloride  3 mL Intravenous Q12H   Continuous Infusions: . sodium chloride 10 mL/hr at 05/13/15 1700  . sodium chloride 50 mL/hr at 05/13/15 1700  . labetalol  (NORMODYNE) infusion 1.8 mg/min (05/14/15 0700)

## 2015-05-15 ENCOUNTER — Inpatient Hospital Stay (HOSPITAL_COMMUNITY): Payer: Commercial Managed Care - PPO

## 2015-05-15 LAB — CULTURE, RESPIRATORY: CULTURE: NORMAL

## 2015-05-15 LAB — CULTURE, RESPIRATORY W GRAM STAIN

## 2015-05-15 LAB — CBC
HEMATOCRIT: 32.4 % — AB (ref 39.0–52.0)
Hemoglobin: 10.9 g/dL — ABNORMAL LOW (ref 13.0–17.0)
MCH: 30.7 pg (ref 26.0–34.0)
MCHC: 33.6 g/dL (ref 30.0–36.0)
MCV: 91.3 fL (ref 78.0–100.0)
Platelets: 144 10*3/uL — ABNORMAL LOW (ref 150–400)
RBC: 3.55 MIL/uL — ABNORMAL LOW (ref 4.22–5.81)
RDW: 14.4 % (ref 11.5–15.5)
WBC: 5.4 10*3/uL (ref 4.0–10.5)

## 2015-05-15 LAB — BASIC METABOLIC PANEL
Anion gap: 9 (ref 5–15)
BUN: 22 mg/dL — ABNORMAL HIGH (ref 6–20)
CALCIUM: 8.8 mg/dL — AB (ref 8.9–10.3)
CO2: 21 mmol/L — AB (ref 22–32)
CREATININE: 2.11 mg/dL — AB (ref 0.61–1.24)
Chloride: 105 mmol/L (ref 101–111)
GFR calc Af Amer: 41 mL/min — ABNORMAL LOW (ref >60)
GFR calc non Af Amer: 35 mL/min — ABNORMAL LOW (ref >60)
GLUCOSE: 102 mg/dL — AB (ref 65–99)
Potassium: 4.4 mmol/L (ref 3.5–5.1)
Sodium: 135 mmol/L (ref 135–145)

## 2015-05-15 LAB — GLUCOSE, CAPILLARY: Glucose-Capillary: 106 mg/dL — ABNORMAL HIGH (ref 65–99)

## 2015-05-15 MED ORDER — LEVOFLOXACIN 500 MG PO TABS
500.0000 mg | ORAL_TABLET | Freq: Every day | ORAL | Status: DC
  Administered 2015-05-16 – 2015-05-17 (×2): 500 mg via ORAL
  Filled 2015-05-15 (×3): qty 1

## 2015-05-15 MED ORDER — DILTIAZEM HCL 60 MG PO TABS
60.0000 mg | ORAL_TABLET | Freq: Four times a day (QID) | ORAL | Status: DC
  Administered 2015-05-15 – 2015-05-17 (×8): 60 mg via ORAL
  Filled 2015-05-15 (×8): qty 1

## 2015-05-15 NOTE — Progress Notes (Signed)
Patient ID: Ian Shade., male   DOB: April 22, 1966, 49 y.o.   MRN: [id number] [facility] PROGRESS NOTE  Ian Shade. [id number] DOB: 04-12-1966 DOA: 05/12/2015 PCP: No PCP Per Patient will set up with [facility] on discharge   Brief narrative:    49 year old male with past medical history of sarcoidosis and hypertension, not compliant with meds due to limited financial resources (no insurance). He presented to [facility] ED with reports of worsening shortness of breath and cough productive of yellowish sputum for past week or so prior to this admission. Shortness of breath was present at rest and with exertion.   In ED, BP was 232/167, HR 96-124, RR 16-29, afebrile and oxygen saturation 93% with [place] oxygen support. He was started on labetalol drip and his BP came down to 184/131. CXR showed right hilar hazy airspace opacity concernign for pneumonia. He was started on empiric Levaquin. He was admitted to SDU due to accelerated hypertension.   Transfer to telemetry 12/25.    Assessment/Plan:    Principal Problem:   SIRS (systemic inflammatory response syndrome) (HCC) / Acute respiratory failure with hypoxemia (HCC) / Lobar pneumonia due to unspecified organism - Likely due to pneumonia - CXR on admission demonstrated right hilar hazy airspace opacity concernign for pneumonia. Repeat CXR this am. - Continue Levaquin  - Strep pneumonia is negative - Legionella and resp culture pending  - Transfer to telemetry today   Active Problems:  Accelerated hypertension - Taper down labetalol drip - Increase Cardizem to 60 mg PO Q 6 hours   Acute renal failure superimposed on stage 3 chronic kidney disease (HCC) - Baseline Cr 1.6 (about year ago) - Cr on this admission 1.8 and as high as  2.31 - Now trending down this am, 2.11   Anemia of chronic renal failure, stage 3 (moderate) - Stable  - Hgb 10.9 - No current indications for transfusion    Pulmonary sarcoidosis/clinical dx only   - Stable     Thrombocytopenia - Unclear etiology - Platelets 182 --> 144 --> 150  DVT prophylaxis:  - SCD's bilaterally in hospital   Code Status: Full.  Family Communication:  plan of care discussed with the patient Disposition Plan: transfer to telemetry toady   IV access:  Peripheral IV  Procedures and diagnostic studies:    Dg Chest 2 View 05/12/2015 Right hilar hazy airspace opacity concerning for pneumonia. Clinical correlation and follow-up recommended. Right hilar adenopathy may be present. Electronically Signed   By: Anner Crete M.D.   On: 05/12/2015 03:45   Medical Consultants:  None   Other Consultants:  None   IAnti-Infectives:   Levaquin 05/12/2015 -->   Leisa Lenz, MD  [facility] Pager [phone]  Time spent in minutes: 25 minutes  If 7PM-7AM, please contact night-coverage [email or web address] Password [facility] 05/15/2015, 12:25 PM   LOS: 3 days    HPI/Subjective: No acute overnight events. Patient reports still coughing.  Objective: Filed Vitals:   05/15/15 0810 05/15/15 0830 05/15/15 0900 05/15/15 0946  BP:  137/96 146/97 144/97  Pulse:  80 73 76  Temp: 98.8 F (37.1 C)   98.4 F (36.9 C)  TempSrc: Oral   Oral  Resp:  27 31 22   Height:      Weight:      SpO2:  97% 97% 97%    Intake/Output Summary (Last 24 hours) at 05/15/15 1225 Last data filed at 05/15/15 0900  Gross per 24 hour  Intake 2295.97  ml  Output   1500 ml  Net 795.97 ml    Exam:   General:  Pt is alert, awake, no distress  Cardiovascular: RRR, appreciate S1, S2   Respiratory: coarse breath sounds, no wheezing   Abdomen: non tender abd, appreciate bowel sounds   Extremities: No leg edema, palpable pulses   Neuro: Nonfocal   Data Reviewed: Basic Metabolic Panel:  Recent Labs Lab 05/12/15 0441 05/12/15 0938 05/13/15 0402 05/14/15 0332 05/15/15 0352  NA 138 139 139 133* 135  K 4.2 4.1 4.1 4.3 4.4  CL 103 107 107 104 105  CO2  --  23 23 21* 21*   GLUCOSE 107* 114* 120* 111* 102*  BUN 22* 22* 24* 24* 22*  CREATININE 1.70* 1.85* 2.21* 2.31* 2.11*  CALCIUM  --  8.4* 9.2 8.8* 8.8*  MG  --  1.7  --   --   --   PHOS  --  2.3*  --   --   --    Liver Function Tests:  Recent Labs Lab 05/12/15 0938  AST 22  ALT 22  ALKPHOS 63  BILITOT 1.1  PROT 7.0  ALBUMIN 3.3*   No results for input(s): LIPASE, AMYLASE in the last 168 hours. No results for input(s): AMMONIA in the last 168 hours. CBC:  Recent Labs Lab 05/12/15 0303 05/12/15 0441 05/12/15 0938 05/13/15 0402 05/14/15 0332 05/15/15 0352  WBC 8.4  --  7.3 5.6 5.9 5.4  NEUTROABS 6.4  --  5.2  --   --   --   HGB 13.3 14.3 11.9* 11.1* 10.4* 10.9*  HCT 40.2 42.0 36.9* 34.1* 31.9* 32.4*  MCV 91.2  --  92.5 91.4 91.4 91.3  PLT 179  --  182 144* 150 144*   Cardiac Enzymes: No results for input(s): CKTOTAL, CKMB, CKMBINDEX, TROPONINI in the last 168 hours. BNP: Invalid input(s): POCBNP CBG:  Recent Labs Lab 05/12/15 0953 05/14/15 0745 05/15/15 0737  GLUCAP 103* 100* 106*    Recent Results (from the past 240 hour(s))  MRSA PCR Screening     Status: None   Collection Time: 05/12/15  9:27 AM  Result Value Ref Range Status   MRSA by PCR NEGATIVE NEGATIVE Final  Culture, sputum-assessment     Status: None   Collection Time: 05/13/15  4:38 AM  Result Value Ref Range Status   Specimen Description SPUTUM  Final   Special Requests NONE  Final   Sputum evaluation   Final    THIS SPECIMEN IS ACCEPTABLE. RESPIRATORY CULTURE REPORT TO FOLLOW.   Report Status 05/13/2015 FINAL  Final     Scheduled Meds: . dextromethorphan-guaiFENesin  1 tablet Oral BID  . diltiazem  60 mg Oral 4 times per day  . [START ON 05/16/2015] levofloxacin  500 mg Oral Q breakfast  . sodium chloride  3 mL Intravenous Q12H   Continuous Infusions: . sodium chloride 50 mL/hr at 05/15/15 0900

## 2015-05-15 NOTE — Progress Notes (Signed)
175 mL of labetalol wasted into sink.

## 2015-05-15 NOTE — Progress Notes (Signed)
PHARMACIST - PHYSICIAN COMMUNICATION DR:  Charlies Silvers CONCERNING: Antibiotic IV to Oral Route Change Policy  RECOMMENDATION: This patient is receiving Levaquin by the intravenous route.  Based on criteria approved by the Pharmacy and Therapeutics Committee, the antibiotic(s) is/are being converted to the equivalent oral dose form(s).   DESCRIPTION: These criteria include:  Patient being treated for a respiratory tract infection, urinary tract infection, cellulitis or clostridium difficile associated diarrhea if on metronidazole  The patient is not neutropenic and does not exhibit a GI malabsorption state  The patient is eating (either orally or via tube) and/or has been taking other orally administered medications for a least 24 hours  The patient is improving clinically and has a Tmax < 100.5  If you have questions about this conversion, please contact the Pharmacy Department  []   [phone] )  [facility] []   [phone] )  [facility] []   [phone] )  [facility] []   [phone] )  [facility] [x]   [phone] )  [place], PharmD, BCPS Pager: [phone] 05/15/2015@11 :16 AM

## 2015-05-16 LAB — BASIC METABOLIC PANEL
ANION GAP: 8 (ref 5–15)
BUN: 23 mg/dL — AB (ref 6–20)
CALCIUM: 8.9 mg/dL (ref 8.9–10.3)
CO2: 23 mmol/L (ref 22–32)
Chloride: 102 mmol/L (ref 101–111)
Creatinine, Ser: 1.88 mg/dL — ABNORMAL HIGH (ref 0.61–1.24)
GFR calc Af Amer: 47 mL/min — ABNORMAL LOW (ref >60)
GFR, EST NON AFRICAN AMERICAN: 40 mL/min — AB (ref >60)
GLUCOSE: 101 mg/dL — AB (ref 65–99)
POTASSIUM: 3.9 mmol/L (ref 3.5–5.1)
SODIUM: 133 mmol/L — AB (ref 135–145)

## 2015-05-16 LAB — LEGIONELLA ANTIGEN, URINE

## 2015-05-16 LAB — GLUCOSE, CAPILLARY: GLUCOSE-CAPILLARY: 93 mg/dL (ref 65–99)

## 2015-05-16 MED ORDER — HYDRALAZINE HCL 50 MG PO TABS
50.0000 mg | ORAL_TABLET | Freq: Four times a day (QID) | ORAL | Status: DC
  Administered 2015-05-16 – 2015-05-17 (×3): 50 mg via ORAL
  Filled 2015-05-16 (×5): qty 1

## 2015-05-16 MED ORDER — HYDRALAZINE HCL 25 MG PO TABS
25.0000 mg | ORAL_TABLET | Freq: Three times a day (TID) | ORAL | Status: DC
Start: 2015-05-16 — End: 2015-05-16
  Administered 2015-05-16: 25 mg via ORAL
  Filled 2015-05-16 (×2): qty 1

## 2015-05-16 NOTE — Progress Notes (Addendum)
Patient ID: Ian Shade., male   DOB: Sep 14, 1965, 49 y.o.   MRN: [id number] [facility] PROGRESS NOTE  Ian Shade. [id number] DOB: [birth year] DOA: 05/12/2015 PCP: No PCP Per Patient will set up with [facility] on discharge   Brief narrative:    49 year old male with past medical history of sarcoidosis and hypertension, not compliant with meds due to limited financial resources (no insurance). He presented to [facility] ED with reports of worsening shortness of breath and cough productive of yellowish sputum for past week or so prior to this admission. Shortness of breath was present at rest and with exertion.   In ED, BP was 232/167, HR 96-124, RR 16-29, afebrile and oxygen saturation 93% with [facility] oxygen support. He was started on labetalol drip and his BP came down to 184/131. CXR showed right hilar hazy airspace opacity concernign for pneumonia. He was started on empiric Levaquin. He was admitted to SDU due to accelerated hypertension.   Transferred to telemetry 12/25.    Assessment/Plan:    Principal Problem:   SIRS (systemic inflammatory response syndrome) (HCC) / Acute respiratory failure with hypoxemia (HCC) / Lobar pneumonia due to unspecified organism - Hypoxia secondary to pneumonia - CXR on admission demonstrated right hilar hazy airspace opacity concernign for pneumonia. Repeat CXR 12/25 showed increased asymetric infiltrates or edema. - Resp status stable - Cough is improving - Continue Levaquin  - Strep pneumonia is negative; resp culture grew normal flora - Legionella is pending  Active Problems:  Accelerated hypertension - Need to control BP better since SBP still in 170 while on Cardizem 60 mh Q 6 hours - Added hydralazine 50 mg Q 6 hours - If no improvement may need cardio consult    Acute renal failure superimposed on stage 3 chronic kidney disease (HCC) - Baseline Cr 1.6 (about year ago) - Cr on this admission 1.8 and as high as  2.31 but finally began to  trend down closer to baseline values (today 1.8)   Anemia of chronic renal failure, stage 3 (moderate) - Stable    Pulmonary sarcoidosis/clinical dx only  - Stable     Thrombocytopenia - Unclear etiology - Platelets stable - No bleeding   DVT prophylaxis:  - SCD's bilaterally   Code Status: Full.  Family Communication:  plan of care discussed with the patient Disposition Plan: home by 12/28 if BP stable and improved, ideally less than 150/100  IV access:  Peripheral IV  Procedures and diagnostic studies:    Dg Chest 2 View 05/12/2015 Right hilar hazy airspace opacity concerning for pneumonia. Clinical correlation and follow-up recommended. Right hilar adenopathy may be present. Electronically Signed   By: Anner Crete M.D.   On: 05/12/2015 03:45   Dg Chest Port 1 View 05/15/2015  1. Increasing asymmetric infiltrates or edema, left greater than right. Electronically Signed   By: Lucrezia Europe M.D.   On: 05/15/2015 10:18   Medical Consultants:  None   Other Consultants:  None   IAnti-Infectives:   Levaquin 05/12/2015 -->   Leisa Lenz, MD  [facility] Pager [phone]  Time spent in minutes: 25 minutes  If 7PM-7AM, please contact night-coverage [email or web address] Password [facility] 05/16/2015, 12:34 PM   LOS: 4 days    HPI/Subjective: No acute overnight events. Patient reports cough better.   Objective: Filed Vitals:   05/16/15 0541 05/16/15 0710 05/16/15 0712 05/16/15 0843  BP: 172/122 176/111 179/106 179/109  Pulse:  87 84   Temp:  TempSrc:      Resp:      Height:      Weight:      SpO2:        Intake/Output Summary (Last 24 hours) at 05/16/15 1234 Last data filed at 05/15/15 1745  Gross per 24 hour  Intake    480 ml  Output    775 ml  Net   -295 ml    Exam:   General:  Pt is alert, no distress  Cardiovascular: Rate controlled, appreciate S1, S2   Respiratory: no wheezing, no rhonchi   Abdomen: (+) BS, non tender     Extremities: No swelling, palpable pulses   Neuro: No focal deficits   Data Reviewed: Basic Metabolic Panel:  Recent Labs Lab 05/12/15 0938 05/13/15 0402 05/14/15 0332 05/15/15 0352 05/16/15 1012  NA 139 139 133* 135 133*  K 4.1 4.1 4.3 4.4 3.9  CL 107 107 104 105 102  CO2 23 23 21* 21* 23  GLUCOSE 114* 120* 111* 102* 101*  BUN 22* 24* 24* 22* 23*  CREATININE 1.85* 2.21* 2.31* 2.11* 1.88*  CALCIUM 8.4* 9.2 8.8* 8.8* 8.9  MG 1.7  --   --   --   --   PHOS 2.3*  --   --   --   --    Liver Function Tests:  Recent Labs Lab 05/12/15 0938  AST 22  ALT 22  ALKPHOS 63  BILITOT 1.1  PROT 7.0  ALBUMIN 3.3*   No results for input(s): LIPASE, AMYLASE in the last 168 hours. No results for input(s): AMMONIA in the last 168 hours. CBC:  Recent Labs Lab 05/12/15 0303 05/12/15 0441 05/12/15 0938 05/13/15 0402 05/14/15 0332 05/15/15 0352  WBC 8.4  --  7.3 5.6 5.9 5.4  NEUTROABS 6.4  --  5.2  --   --   --   HGB 13.3 14.3 11.9* 11.1* 10.4* 10.9*  HCT 40.2 42.0 36.9* 34.1* 31.9* 32.4*  MCV 91.2  --  92.5 91.4 91.4 91.3  PLT 179  --  182 144* 150 144*   Cardiac Enzymes: No results for input(s): CKTOTAL, CKMB, CKMBINDEX, TROPONINI in the last 168 hours. BNP: Invalid input(s): POCBNP CBG:  Recent Labs Lab 05/12/15 0953 05/14/15 0745 05/15/15 0737 05/16/15 0740  GLUCAP 103* 100* 106* 93    Recent Results (from the past 240 hour(s))  MRSA PCR Screening     Status: None   Collection Time: 05/12/15  9:27 AM  Result Value Ref Range Status   MRSA by PCR NEGATIVE NEGATIVE Final  Culture, sputum-assessment     Status: None   Collection Time: 05/13/15  4:38 AM  Result Value Ref Range Status   Specimen Description SPUTUM  Final   Special Requests NONE  Final   Sputum evaluation   Final    THIS SPECIMEN IS ACCEPTABLE. RESPIRATORY CULTURE REPORT TO FOLLOW.   Report Status 05/13/2015 FINAL  Final     Scheduled Meds: . dextromethorphan-guaiFENesin  1 tablet Oral  BID  . diltiazem  60 mg Oral 4 times per day  . hydrALAZINE  50 mg Oral 4 times per day  . levofloxacin  500 mg Oral Q breakfast  . sodium chloride  3 mL Intravenous Q12H   Continuous Infusions: . sodium chloride Stopped (05/15/15 1229)

## 2015-05-17 LAB — BASIC METABOLIC PANEL
ANION GAP: 9 (ref 5–15)
BUN: 25 mg/dL — ABNORMAL HIGH (ref 6–20)
CALCIUM: 9.4 mg/dL (ref 8.9–10.3)
CO2: 24 mmol/L (ref 22–32)
CREATININE: 1.98 mg/dL — AB (ref 0.61–1.24)
Chloride: 103 mmol/L (ref 101–111)
GFR calc Af Amer: 44 mL/min — ABNORMAL LOW (ref >60)
GFR calc non Af Amer: 38 mL/min — ABNORMAL LOW (ref >60)
GLUCOSE: 125 mg/dL — AB (ref 65–99)
POTASSIUM: 4 mmol/L (ref 3.5–5.1)
SODIUM: 136 mmol/L (ref 135–145)

## 2015-05-17 LAB — GLUCOSE, CAPILLARY: GLUCOSE-CAPILLARY: 82 mg/dL (ref 65–99)

## 2015-05-17 MED ORDER — CLONIDINE HCL 0.1 MG PO TABS
0.1000 mg | ORAL_TABLET | Freq: Two times a day (BID) | ORAL | Status: DC
  Administered 2015-05-17: 0.1 mg via ORAL
  Filled 2015-05-17 (×2): qty 1

## 2015-05-17 MED ORDER — DOCUSATE SODIUM 100 MG PO CAPS
100.0000 mg | ORAL_CAPSULE | Freq: Two times a day (BID) | ORAL | Status: DC
  Administered 2015-05-17 – 2015-05-20 (×6): 100 mg via ORAL
  Filled 2015-05-17 (×7): qty 1

## 2015-05-17 MED ORDER — HYDRALAZINE HCL 25 MG PO TABS
25.0000 mg | ORAL_TABLET | Freq: Three times a day (TID) | ORAL | Status: DC
Start: 2015-05-17 — End: 2015-05-17

## 2015-05-17 MED ORDER — DILTIAZEM HCL 60 MG PO TABS
60.0000 mg | ORAL_TABLET | Freq: Three times a day (TID) | ORAL | Status: DC
  Administered 2015-05-17 – 2015-05-18 (×3): 60 mg via ORAL
  Filled 2015-05-17 (×3): qty 1

## 2015-05-17 MED ORDER — CLONIDINE HCL 0.2 MG PO TABS
0.2000 mg | ORAL_TABLET | Freq: Two times a day (BID) | ORAL | Status: DC
  Administered 2015-05-17: 0.2 mg via ORAL
  Filled 2015-05-17 (×2): qty 1

## 2015-05-17 MED ORDER — IBUPROFEN 200 MG PO TABS
400.0000 mg | ORAL_TABLET | Freq: Four times a day (QID) | ORAL | Status: DC | PRN
  Administered 2015-05-17 – 2015-05-19 (×3): 400 mg via ORAL
  Filled 2015-05-17 (×3): qty 2

## 2015-05-17 NOTE — Progress Notes (Signed)
PHARMACY NOTE -  [place] has been assisting with dosing of Levaquin for pneumonia. Dosage remains stable at Levaquin 500 mg daily and need for further dosage adjustment appears unlikely at present as SCr continues to improve.  Today is day #6 of antibiotic treatment.  Would consider 8 day treatment for pneumonia.  Will sign off at this time.  Please reconsult if a change in clinical status warrants re-evaluation of dosage.  Hershal Coria, PharmD, BCPS Pager: [phone] 05/17/2015 9:06 AM

## 2015-05-17 NOTE — Progress Notes (Signed)
Patient ID: Ian Gregory., male   DOB: 05/17/1966, 49 y.o.   MRN: [id number] [facility] PROGRESS NOTE  Ian Gregory. [id number] DOB: [birth year] DOA: 05/12/2015 PCP: No PCP Per Patient will set up with [facility] on discharge   Brief narrative:    49 year old male with past medical history of sarcoidosis and hypertension, not compliant with meds due to limited financial resources (no insurance). He presented to [facility] ED with reports of worsening shortness of breath and cough productive of yellowish sputum for past week or so prior to this admission. Shortness of breath was present at rest and with exertion.   In ED, BP was 232/167, HR 96-124, RR 16-29, afebrile and oxygen saturation 93% with [facility] oxygen support. He was started on labetalol drip and his BP came down to 184/131. CXR showed right hilar hazy airspace opacity concernign for pneumonia. He was started on empiric Levaquin. He was admitted to SDU due to accelerated hypertension.   Transferred to telemetry 12/25.   Assessment/Plan:    Principal Problem:   SIRS (systemic inflammatory response syndrome) (HCC) / Acute respiratory failure with hypoxemia (HCC) / Lobar pneumonia due to unspecified organism - Hypoxia resolved at this point. Hypoxia on admission likely due to pneumonia - CXR on admission demonstrated right hilar hazy airspace opacity concernign for pneumonia. Repeat CXR 04/21/24 showed increased asymetric infiltrates or edema. - Continue Levaquin  - Strep pneumonia and legionella are negative; resp culture grew normal flora  Active Problems:  Accelerated hypertension - Cardizem 60 mg Q 6 hours, Norvasc 10 mg QD and hydralazine as high as 75 mg every 6 hours did not control BP adequately - We will stop hydralazine today and Norvasc and add clonidine and see if this will control BP better   Acute renal failure superimposed on stage 3 chronic kidney disease (HCC) - Baseline Cr 1.6 (about year ago) - Cr on this  admission 1.8 and as high as  2.31 but finally began to trend down to 1.8   Anemia of chronic renal failure, stage 3 (moderate) - Hemoglobin 10.9 - No current indications for transfusion    Pulmonary sarcoidosis/clinical dx only  - Stable     Thrombocytopenia - Unclear etiology - Platelets stable at 144  DVT prophylaxis:  - SCD's   Code Status: Full.  Family Communication:  plan of care discussed with the patient Disposition Plan: home by 12/28 if BP less than 150/100  IV access:  Peripheral IV  Procedures and diagnostic studies:    Dg Chest 2 View 05/12/2015 Right hilar hazy airspace opacity concerning for pneumonia. Clinical correlation and follow-up recommended. Right hilar adenopathy may be present. Electronically Signed   By: Anner Crete M.D.   On: 05/12/2015 03:45   Dg Chest Port 1 View 05/15/2015  1. Increasing asymmetric infiltrates or edema, left greater than right. Electronically Signed   By: Lucrezia Europe M.D.   On: 05/15/2015 10:18   Medical Consultants:  None   Other Consultants:  None   IAnti-Infectives:   Levaquin 05/12/2015 -->   Leisa Lenz, MD  [facility] Pager [phone]  Time spent in minutes: 25 minutes  If 7PM-7AM, please contact night-coverage [email or web address] Password [email or web address] 05/17/2015, 11:13 AM   LOS: 5 days    HPI/Subjective: No acute overnight events. Patient reports headache this am.  Objective: Filed Vitals:   05/17/15 0500 05/17/15 0540 05/17/15 0637 05/17/15 1030  BP:  193/132 182/103 166/110  Pulse:  83  Temp:  98.3 F (36.8 C)    TempSrc:  Oral    Resp:  18    Height:      Weight: 97.523 kg (215 lb)     SpO2:  97%      Intake/Output Summary (Last 24 hours) at 05/17/15 1113 Last data filed at 05/17/15 0657  Gross per 24 hour  Intake    480 ml  Output      0 ml  Net    480 ml    Exam:   General:  Pt is alert, awake, no acute dsitress  Cardiovascular: RRR, appreciate S1, S2  Respiratory:  bilateral air entry, no wheezing   Abdomen: non tender, non distended, (+) BS  Extremities: No edema, palpable pulses   Neuro: Nonfocal  Data Reviewed: Basic Metabolic Panel:  Recent Labs Lab 05/12/15 0938 05/13/15 0402 05/14/15 0332 05/15/15 0352 05/16/15 1012 05/17/15 0949  NA 139 139 133* 135 133* 136  K 4.1 4.1 4.3 4.4 3.9 4.0  CL 107 107 104 105 102 103  CO2 23 23 21* 21* 23 24  GLUCOSE 114* 120* 111* 102* 101* 125*  BUN 22* 24* 24* 22* 23* 25*  CREATININE 1.85* 2.21* 2.31* 2.11* 1.88* 1.98*  CALCIUM 8.4* 9.2 8.8* 8.8* 8.9 9.4  MG 1.7  --   --   --   --   --   PHOS 2.3*  --   --   --   --   --    Liver Function Tests:  Recent Labs Lab 05/12/15 0938  AST 22  ALT 22  ALKPHOS 63  BILITOT 1.1  PROT 7.0  ALBUMIN 3.3*   No results for input(s): LIPASE, AMYLASE in the last 168 hours. No results for input(s): AMMONIA in the last 168 hours. CBC:  Recent Labs Lab 05/12/15 0303 05/12/15 0441 05/12/15 0938 05/13/15 0402 05/14/15 0332 05/15/15 0352  WBC 8.4  --  7.3 5.6 5.9 5.4  NEUTROABS 6.4  --  5.2  --   --   --   HGB 13.3 14.3 11.9* 11.1* 10.4* 10.9*  HCT 40.2 42.0 36.9* 34.1* 31.9* 32.4*  MCV 91.2  --  92.5 91.4 91.4 91.3  PLT 179  --  182 144* 150 144*   Cardiac Enzymes: No results for input(s): CKTOTAL, CKMB, CKMBINDEX, TROPONINI in the last 168 hours. BNP: Invalid input(s): POCBNP CBG:  Recent Labs Lab 05/12/15 0953 05/14/15 0745 05/15/15 0737 05/16/15 0740 05/17/15 0738  GLUCAP 103* 100* 106* 93 82    Recent Results (from the past 240 hour(s))  MRSA PCR Screening     Status: None   Collection Time: 05/12/15  9:27 AM  Result Value Ref Range Status   MRSA by PCR NEGATIVE NEGATIVE Final  Culture, sputum-assessment     Status: None   Collection Time: 05/13/15  4:38 AM  Result Value Ref Range Status   Specimen Description SPUTUM  Final   Special Requests NONE  Final   Sputum evaluation   Final    THIS SPECIMEN IS ACCEPTABLE.  RESPIRATORY CULTURE REPORT TO FOLLOW.   Report Status 05/13/2015 FINAL  Final     Scheduled Meds: . cloNIDine  0.1 mg Oral BID  . dextromethorphan-guaiFENesin  1 tablet Oral BID  . diltiazem  60 mg Oral 3 times per day  . docusate sodium  100 mg Oral BID  . levofloxacin  500 mg Oral Q breakfast  . sodium chloride  3 mL Intravenous Q12H   Continuous Infusions: .  sodium chloride Stopped (05/15/15 1229)

## 2015-05-18 ENCOUNTER — Inpatient Hospital Stay (HOSPITAL_COMMUNITY): Payer: Commercial Managed Care - PPO

## 2015-05-18 ENCOUNTER — Encounter (HOSPITAL_COMMUNITY): Payer: Self-pay | Admitting: Student

## 2015-05-18 DIAGNOSIS — J181 Lobar pneumonia, unspecified organism: Secondary | ICD-10-CM

## 2015-05-18 DIAGNOSIS — N183 Chronic kidney disease, stage 3 (moderate): Secondary | ICD-10-CM

## 2015-05-18 DIAGNOSIS — N179 Acute kidney failure, unspecified: Secondary | ICD-10-CM

## 2015-05-18 DIAGNOSIS — I509 Heart failure, unspecified: Secondary | ICD-10-CM

## 2015-05-18 DIAGNOSIS — J9601 Acute respiratory failure with hypoxia: Secondary | ICD-10-CM

## 2015-05-18 DIAGNOSIS — D860 Sarcoidosis of lung: Secondary | ICD-10-CM

## 2015-05-18 DIAGNOSIS — I161 Hypertensive emergency: Secondary | ICD-10-CM

## 2015-05-18 DIAGNOSIS — J988 Other specified respiratory disorders: Secondary | ICD-10-CM

## 2015-05-18 DIAGNOSIS — J22 Unspecified acute lower respiratory infection: Secondary | ICD-10-CM | POA: Diagnosis present

## 2015-05-18 LAB — GLUCOSE, CAPILLARY: GLUCOSE-CAPILLARY: 88 mg/dL (ref 65–99)

## 2015-05-18 MED ORDER — HYDRALAZINE HCL 50 MG PO TABS
50.0000 mg | ORAL_TABLET | Freq: Three times a day (TID) | ORAL | Status: DC
  Administered 2015-05-18 (×2): 50 mg via ORAL
  Filled 2015-05-18 (×2): qty 1

## 2015-05-18 MED ORDER — DILTIAZEM HCL 60 MG PO TABS
60.0000 mg | ORAL_TABLET | Freq: Four times a day (QID) | ORAL | Status: DC
  Administered 2015-05-18: 60 mg via ORAL
  Filled 2015-05-18: qty 1

## 2015-05-18 MED ORDER — LEVOFLOXACIN 500 MG PO TABS
500.0000 mg | ORAL_TABLET | Freq: Once | ORAL | Status: AC
  Administered 2015-05-18: 500 mg via ORAL
  Filled 2015-05-18: qty 1

## 2015-05-18 MED ORDER — HYDRALAZINE HCL 50 MG PO TABS
75.0000 mg | ORAL_TABLET | Freq: Three times a day (TID) | ORAL | Status: DC
  Administered 2015-05-18 – 2015-05-20 (×5): 75 mg via ORAL
  Filled 2015-05-18 (×6): qty 1

## 2015-05-18 MED ORDER — AMLODIPINE BESYLATE 10 MG PO TABS
10.0000 mg | ORAL_TABLET | Freq: Every day | ORAL | Status: DC
  Administered 2015-05-18 – 2015-05-20 (×3): 10 mg via ORAL
  Filled 2015-05-18 (×3): qty 1

## 2015-05-18 NOTE — Consult Note (Signed)
CARDIOLOGY CONSULT NOTE   Patient ID: Ian Gregory. MRN: [id number], DOB/AGE: 12/09/1965   Admit date: 05/12/2015 Date of Consult: 05/18/2015 Reason for Consult: Management of BP   Primary Physician: No PCP Per Patient Primary Cardiologist: New  HPI: Ian Gregory. is a 49 y.o. male with past medical history of HTN, Sarcoidosis, and Stage 3 CKD who presented to [place] ED on 05/12/2015 for worsening shortness of breath and a productive cough over the past week.   While in the ED, his BP was elevated to 232/167. He was placed on a Labetalol drip and PRN Hydralazine. CXR showed right hilar airspace opacity and he was placed on Levaquin for his PNA.  His respiratory symptoms have continued to improve but his BP has remained difficult to manage. His Labetalol drip was weaned as PO Cardizem was initiated on 05/13/2015. His Cardizem was dosed at 60mg  Q6H and his BP remained elevated in the 170's despite the addition of Norvasc 10mg  daily. On 05/16/2015, Hydralazine 50mg  Q6H was added to his regimen. His BP remained in the 170's - 180's even with the titration of Hydralazine to 75mg  Q6H. Therefore, Hydralazine and Norvasc were stopped and Clonidine 0.1mg  BID was added to his regimen on 05/17/2015.  He reports tolerating the different medications he has been on well, except for the Labetalol drip during which he had a headache. He denies any current headaches, dizziness, lightheadedness, or changes in vision at this time. Reports his respiratory status has significantly improved since admission.  He had been on Lisinopril in the past but this was discontinued due to his CKD. He had most recently been on Bystolic but he could not afford this medication, even with insurance so he stopped taking it 1 year ago. He reports his BP was in the [id number] while on Bystolic.  Problem List Past Medical History  Diagnosis Date  . HTN (hypertension)   . Sarcoidosis ([place])     History reviewed. No  pertinent past surgical history.   Allergies  No Known Allergies    Inpatient Medications  . dextromethorphan-guaiFENesin  1 tablet Oral BID  . diltiazem  60 mg Oral 4 times per day  . docusate sodium  100 mg Oral BID  . hydrALAZINE  50 mg Oral 3 times per day  . sodium chloride  3 mL Intravenous Q12H    Family History Family History  Problem Relation Age of Onset  . Hypertension Mother   . Kidney failure Mother   . Diabetes Father   . Hypertension Father      Social History Social History   Social History  . Marital Status: Single    Spouse Name: N/A  . Number of Children: N/A  . Years of Education: N/A   Occupational History  . [employer]   Social History Main Topics  . Smoking status: Never Smoker   . Smokeless tobacco: Never Used  . Alcohol Use: Yes     Comment: beer on wkends  . Drug Use: No  . Sexual Activity: Not on file   Other Topics Concern  . Not on file   Social History Narrative     Review of Systems General:  No chills, fever, night sweats or weight changes.  Cardiovascular:  No chest pain, dyspnea on exertion, edema, orthopnea, palpitations, paroxysmal nocturnal dyspnea. Dermatological: No rash, lesions/masses Respiratory: No cough, Positive for dyspnea Urologic: No hematuria, dysuria Abdominal:   No nausea, vomiting, diarrhea, bright red blood  per rectum, melena, or hematemesis Neurologic:  No visual changes, wkns, changes in mental status. All other systems reviewed and are otherwise negative except as noted above.  Physical Exam  Blood pressure 186/109, pulse 77, temperature 98.3 F (36.8 C), temperature source Oral, resp. rate 18, height 6\' 3"  (1.905 m), weight 207 lb 3.2 oz (93.985 kg), SpO2 97 %.  General: Pleasant, African American male appearing in NAD Psych: Normal affect. Neuro: Alert and oriented X 3. Moves all extremities spontaneously. HEENT: Normal  Neck: Supple without bruits or JVD. Lungs:   Resp regular and unlabored, CTA without wheezing or rales. Heart: RRR, no s3, s4, or murmurs. Abdomen: Soft, non-tender, non-distended, BS + x 4.  Extremities: No clubbing, cyanosis or edema. DP/PT/Radials 2+ and equal bilaterally.  Labs  No results for input(s): CKTOTAL, CKMB, TROPONINI in the last 72 hours. Lab Results  Component Value Date   WBC 5.4 05/15/2015   HGB 10.9* 05/15/2015   HCT 32.4* 05/15/2015   MCV 91.3 05/15/2015   PLT 144* 05/15/2015    Recent Labs Lab 05/12/15 0938  05/17/15 0949  NA 139  < > 136  K 4.1  < > 4.0  CL 107  < > 103  CO2 23  < > 24  BUN 22*  < > 25*  CREATININE 1.85*  < > 1.98*  CALCIUM 8.4*  < > 9.4  PROT 7.0  --   --   BILITOT 1.1  --   --   ALKPHOS 63  --   --   ALT 22  --   --   AST 22  --   --   GLUCOSE 114*  < > 125*  < > = values in this interval not displayed. No results found for: CHOL, HDL, LDLCALC, TRIG No results found for: DDIMER  Radiology/Studies  Dg Chest 2 View: 05/12/2015  CLINICAL DATA:  49 year old male with productive cough and chest tightness EXAM: CHEST  2 VIEW COMPARISON:  Chest radiograph dated 08/20/2013 FINDINGS: Two views of the chest demonstrate a focal area of haziness in the right infrahilar region concerning for pneumonia. There is right hilar prominence which may represent adenopathy. There is no pleural effusion or pneumothorax. Mild cardiomegaly. The osseous structures appear unremarkable. IMPRESSION: Right hilar hazy airspace opacity concerning for pneumonia. Clinical correlation and follow-up recommended. Right hilar adenopathy may be present. Electronically Signed   By: Anner Crete M.D.   On: 05/12/2015 03:45   Dg Chest Port 1 View: 05/15/2015  CLINICAL DATA:  SOb and productive cough x 1 week; pt id checked with RN EXAM: PORTABLE CHEST - 1 VIEW COMPARISON:  05/12/2015 FINDINGS: Progressive interstitial and alveolar opacities throughout both lungs left greater than right, with some peripheral sparing.  Heart size upper limits normal for technique. No definite effusion.  No pneumothorax. Visualized skeletal structures are unremarkable. IMPRESSION: 1. Increasing asymmetric infiltrates or edema, left greater than right. Electronically Signed   By: Lucrezia Europe M.D.   On: 05/15/2015 10:18    ECG: 05/12/2015: Sinus tachycardia, HR in 110's.    ECHOCARDIOGRAM: None on File   ASSESSMENT AND PLAN:  1. Accelerated HTN - was on Bystolic 1 year ago but quit taking the medication secondary to financial reasons. BP was still in the 160's at that time. - currently on Cardizem 60mg  Q6H and Hydralazine 50mg  Q6H. Patient is unsure if he would be able to be compliant on QID or TID dosing going home. Would consider switching short-acting Cardizem to  Cardizem CD and switching Hydralazine back to Clonidine 0.2mg  BID. Could also add back Amlodipine 10mg , as I am unsure as to why this was cancelled. Patient reports his only symptom with the medications tried this admission has been a headache with the Labetalol drip. Would avoid ACE-I or ARB with his CKD. - also consider obtaining renal ultrasound with his refractory HTN.  2. Acute Hypoxic Respiratory Failure secondary to lobar PNA - significantly improved since admission - on Abx treatment - per admitting team   3. Acute on Chronic Stage 3 CKD - baseline around 1.6 - peaked at 2.31 this admission. Trending down to 1.98 today.  Signed, Erma Heritage, PA-C 05/18/2015, 12:44 PM Pager: [phone]

## 2015-05-18 NOTE — Progress Notes (Signed)
Patient ID: Ian Gregory., male   DOB: 1965-08-19, 49 y.o.   MRN: [id number] [facility] PROGRESS NOTE  Ian Gregory. [id number] DOB: [birth year] DOA: 05/12/2015 PCP: No PCP Per Patient will set up with [facility] on discharge   Brief narrative:    49 year old male with past medical history of sarcoidosis and hypertension, not compliant with meds due to limited financial resources (no insurance). He presented to [facility] ED with reports of worsening shortness of breath and cough productive of yellowish sputum for past week or so prior to this admission. Shortness of breath was present at rest and with exertion.   In ED, BP was 232/167, HR 96-124, RR 16-29, afebrile and oxygen saturation 93% with [place] oxygen support. He was started on labetalol drip and his BP came down to 184/131. CXR showed right hilar hazy airspace opacity concernign for pneumonia. He was started on empiric Levaquin. He was admitted to SDU due to accelerated hypertension.   Transferred to telemetry 12/25.   Barrier to discharge: very difficult to control BP. Cardio consulted.  Assessment/Plan:    Principal Problem:   SIRS (systemic inflammatory response syndrome) (HCC) / Acute respiratory failure with hypoxemia (HCC) / Lobar pneumonia due to unspecified organism - Hypoxia on admission likely due to pneumonia - CXR on admission demonstrated right hilar hazy airspace opacity concernign for pneumonia. Repeat CXR 04/21/24 showed increased asymetric infiltrates or edema. - Stable respiratory status - Pt on empiric Levaquin which we will stop today after he receives the dose today  - Strep pneumonia and legionella are negative; resp culture grew normal flora  Active Problems:  Accelerated hypertension - Initially on labetalol drip and hydralazine IV PRN. Then started on Cardizem 60 mg Q 6 hours, Norvasc 10 mg QD and hydralazine as high as 75 mg every 6 hours which did not control BP adequately - We evan tried clonidine  yesterday with no significant improvemnt in BP - Currently on Cardizem and hydralazine combination   Acute renal failure superimposed on stage 3 chronic kidney disease (HCC) - Baseline Cr 1.6 (about year ago) - Cr on this admission 1.8 and as high as  2.31 but began to trend down to 1.8   Anemia of chronic renal failure, stage 3 (moderate) - Hemoglobin 10.9   Pulmonary sarcoidosis/clinical dx only  - Stable     Thrombocytopenia - Unclear etiology - Platelets stable at 144  DVT prophylaxis:  - SCD's in hospital   Code Status: Full.  Family Communication:  plan of care discussed with the patient Disposition Plan: home once BP 150/90 or less  IV access:  Peripheral IV  Procedures and diagnostic studies:    Dg Chest 2 View 05/12/2015 Right hilar hazy airspace opacity concerning for pneumonia. Clinical correlation and follow-up recommended. Right hilar adenopathy may be present. Electronically Signed   By: Anner Crete M.D.   On: 05/12/2015 03:45   Dg Chest Port 1 View 05/15/2015  1. Increasing asymmetric infiltrates or edema, left greater than right. Electronically Signed   By: Lucrezia Europe M.D.   On: 05/15/2015 10:18   Medical Consultants:  Cardiology    Other Consultants:  None   IAnti-Infectives:   Levaquin 05/12/2015 --> 05/18/2015   Leisa Lenz, MD  [facility] Pager [phone]  Time spent in minutes: 25 minutes  If 7PM-7AM, please contact night-coverage [email or web address] Password [email or web address] 05/18/2015, 7:20 AM   LOS: 6 days    HPI/Subjective: No acute overnight events. Patient reports feeling  tired this am.   Objective: Filed Vitals:   05/17/15 1030 05/17/15 1346 05/17/15 2125 05/18/15 0523  BP: 166/110 169/101 161/107 186/109  Pulse:  90 73 77  Temp:  98.1 F (36.7 C) 97.7 F (36.5 C) 98.3 F (36.8 C)  TempSrc:  Oral Oral Oral  Resp:  18 18 18   Height:      Weight:    93.985 kg (207 lb 3.2 oz)  SpO2:  96% 94% 97%    Intake/Output Summary  (Last 24 hours) at 05/18/15 0720 Last data filed at 05/18/15 0700  Gross per 24 hour  Intake    480 ml  Output      0 ml  Net    480 ml    Exam:   General:  Pt is not in acute dsitress  Cardiovascular: Rate controlled, appreciate S1, S2   Respiratory: clear to auscultation bilaterally, no wheezing   Abdomen: (+) BS, non tender   Extremities: No swelling, palpable pulses   Neuro: No focal deficits   Data Reviewed: Basic Metabolic Panel:  Recent Labs Lab 05/12/15 0938 05/13/15 0402 05/14/15 0332 05/15/15 0352 05/16/15 1012 05/17/15 0949  NA 139 139 133* 135 133* 136  K 4.1 4.1 4.3 4.4 3.9 4.0  CL 107 107 104 105 102 103  CO2 23 23 21* 21* 23 24  GLUCOSE 114* 120* 111* 102* 101* 125*  BUN 22* 24* 24* 22* 23* 25*  CREATININE 1.85* 2.21* 2.31* 2.11* 1.88* 1.98*  CALCIUM 8.4* 9.2 8.8* 8.8* 8.9 9.4  MG 1.7  --   --   --   --   --   PHOS 2.3*  --   --   --   --   --    Liver Function Tests:  Recent Labs Lab 05/12/15 0938  AST 22  ALT 22  ALKPHOS 63  BILITOT 1.1  PROT 7.0  ALBUMIN 3.3*   No results for input(s): LIPASE, AMYLASE in the last 168 hours. No results for input(s): AMMONIA in the last 168 hours. CBC:  Recent Labs Lab 05/12/15 0303 05/12/15 0441 05/12/15 0938 05/13/15 0402 05/14/15 0332 05/15/15 0352  WBC 8.4  --  7.3 5.6 5.9 5.4  NEUTROABS 6.4  --  5.2  --   --   --   HGB 13.3 14.3 11.9* 11.1* 10.4* 10.9*  HCT 40.2 42.0 36.9* 34.1* 31.9* 32.4*  MCV 91.2  --  92.5 91.4 91.4 91.3  PLT 179  --  182 144* 150 144*   Cardiac Enzymes: No results for input(s): CKTOTAL, CKMB, CKMBINDEX, TROPONINI in the last 168 hours. BNP: Invalid input(s): POCBNP CBG:  Recent Labs Lab 05/12/15 0953 05/14/15 0745 05/15/15 0737 05/16/15 0740 05/17/15 0738  GLUCAP 103* 100* 106* 93 82    Recent Results (from the past 240 hour(s))  MRSA PCR Screening     Status: None   Collection Time: 05/12/15  9:27 AM  Result Value Ref Range Status   MRSA by PCR  NEGATIVE NEGATIVE Final  Culture, sputum-assessment     Status: None   Collection Time: 05/13/15  4:38 AM  Result Value Ref Range Status   Specimen Description SPUTUM  Final   Special Requests NONE  Final   Sputum evaluation   Final    THIS SPECIMEN IS ACCEPTABLE. RESPIRATORY CULTURE REPORT TO FOLLOW.   Report Status 05/13/2015 FINAL  Final     Scheduled Meds: . cloNIDine  0.2 mg Oral BID  . dextromethorphan-guaiFENesin  1 tablet Oral  BID  . diltiazem  60 mg Oral 4 times per day  . docusate sodium  100 mg Oral BID  . levofloxacin  500 mg Oral Q breakfast  . sodium chloride  3 mL Intravenous Q12H   Continuous Infusions: . sodium chloride Stopped (05/15/15 1229)

## 2015-05-18 NOTE — Progress Notes (Signed)
  Echocardiogram 2D Echocardiogram has been performed.  Jennette Dubin 05/18/2015, 3:49 PM

## 2015-05-18 NOTE — Care Management Note (Signed)
Case Management Note  Patient Details  Name: Ian Gregory. MRN: [id number] Date of Birth: [birth year]  Subjective/Objective:  Patient has insurance-has script coverage.Informed patient of his obligation of co pay for scripts(he can afford)Provided patient w/pcp listing, [place] [facility] not accepting patients. Informed patient he can also contact his insurance cust service tel# for a pcp. Provided w/$4 [facility] med list, also discount coupon for norvasc $8.06(patient can afford) Patient voiced understanding.                 Action/Plan:d/c home no further d/c needs.   Expected Discharge Date:                  Expected Discharge Plan:  Home/Self Care  In-House Referral:  NA, PCP / [facility]  Discharge planning Services  CM Consult  Post Acute Care Choice:  NA Choice offered to:  NA  DME Arranged:    DME Agency:     HH Arranged:    HH Agency:     Status of Service:  In process, will continue to follow  Medicare Important Message Given:    Date Medicare IM Given:    Medicare IM give by:    Date Additional Medicare IM Given:    Additional Medicare Important Message give by:     If discussed at [place] of Stay Meetings, dates discussed:    Additional Comments:  Dessa Phi, RN 05/18/2015, 4:16 PM

## 2015-05-19 DIAGNOSIS — I169 Hypertensive crisis, unspecified: Secondary | ICD-10-CM | POA: Insufficient documentation

## 2015-05-19 DIAGNOSIS — I10 Essential (primary) hypertension: Secondary | ICD-10-CM

## 2015-05-19 LAB — GLUCOSE, CAPILLARY: GLUCOSE-CAPILLARY: 93 mg/dL (ref 65–99)

## 2015-05-19 MED ORDER — LUBRIDERM SERIOUSLY SENSITIVE EX LOTN
TOPICAL_LOTION | CUTANEOUS | Status: DC | PRN
  Filled 2015-05-19: qty 562

## 2015-05-19 MED ORDER — DOCUSATE SODIUM 100 MG PO CAPS: 100.0000 mg | ORAL_CAPSULE | Freq: Every day | ORAL | Status: DC | PRN

## 2015-05-19 MED ORDER — FLUTICASONE PROPIONATE 50 MCG/ACT NA SUSP: 1.0000 | Freq: Every day | NASAL | Status: DC

## 2015-05-19 MED ORDER — GUAIFENESIN-CODEINE 100-10 MG/5ML PO SOLN
10.0000 mL | Freq: Every evening | ORAL | Status: DC | PRN
  Administered 2015-05-19 – 2015-05-20 (×2): 10 mL via ORAL
  Filled 2015-05-19 (×2): qty 10

## 2015-05-19 MED ORDER — LABETALOL HCL 300 MG PO TABS
300.0000 mg | ORAL_TABLET | Freq: Two times a day (BID) | ORAL | Status: DC
  Administered 2015-05-19: 300 mg via ORAL
  Filled 2015-05-19 (×2): qty 1

## 2015-05-19 MED ORDER — AMLODIPINE BESYLATE 10 MG PO TABS: 10.0000 mg | ORAL_TABLET | Freq: Every day | ORAL | Status: DC

## 2015-05-19 MED ORDER — METOPROLOL TARTRATE 25 MG PO TABS: 25.0000 mg | ORAL_TABLET | Freq: Two times a day (BID) | ORAL | Status: DC

## 2015-05-19 MED ORDER — METOPROLOL TARTRATE 25 MG PO TABS
25.0000 mg | ORAL_TABLET | Freq: Two times a day (BID) | ORAL | Status: DC
  Administered 2015-05-19: 25 mg via ORAL
  Filled 2015-05-19: qty 1

## 2015-05-19 MED ORDER — HYDRALAZINE HCL 25 MG PO TABS: 75.0000 mg | ORAL_TABLET | Freq: Three times a day (TID) | ORAL | Status: DC

## 2015-05-19 NOTE — Discharge Summary (Addendum)
Physician Discharge Summary  Ian Gregory. [id number] DOB: 11/26/1965 DOA: 05/12/2015  PCP: Patient will follow [facility] with [facility] on discharge.  Admit date: 05/12/2015 Discharge date: 05/19/2015  Recommendations for Outpatient Follow-up:  Continue Norvasc, labetalol and hydralazine for blood pressure control  Follow-up with primary care physician in about one week after discharge to make sure your blood pressure is stable.    Discharge Diagnoses:  Principal Problem:   Acute respiratory failure with hypoxemia (HCC) Active Problems:   SIRS (systemic inflammatory response syndrome) (HCC)   Lobar pneumonia, unspecified organism ([place])   Pulmonary sarcoidosis/clinical dx only    Accelerated hypertension   Acute renal failure superimposed on stage 3 chronic kidney disease (HCC)   Anemia of chronic renal failure, stage 3 (moderate)   Thrombocytopenia (HCC)   Chest cold   Hypertensive emergency    Discharge Condition: stable   Diet recommendation: as tolerated   History of present illness:  49 year old male with past medical history of sarcoidosis and hypertension, not compliant with meds due to limited financial resources (no insurance). He presented to [facility] ED with reports of worsening shortness of breath and cough productive of yellowish sputum for past week or so prior to this admission. Shortness of breath was present at rest and with exertion.   In ED, BP was 232/167, HR 96-124, RR 16-29, afebrile and oxygen saturation 93% with [place] oxygen support. He was started on labetalol drip and his BP came down to 184/131. CXR showed right hilar hazy airspace opacity concernign for pneumonia. He was started on empiric Levaquin. He was admitted to SDU due to accelerated hypertension.   Transferred to telemetry 12/25.   Hospital Course:    Assessment/Plan:    Principal Problem:  SIRS (systemic inflammatory response syndrome) (HCC) / Acute respiratory  failure with hypoxemia (HCC) / Lobar pneumonia due to unspecified organism - Hypoxia on admission likely due to pneumonia - CXR on admission demonstrated right hilar hazy airspace opacity concernign for pneumonia. Repeat CXR 04/21/24 showed increased asymetric infiltrates or edema. - Stable respiratory status - Pt on empiric Levaquin through 05/18/2015 - Strep pneumonia and legionella are negative; resp culture grew normal flora  Active Problems:  Accelerated hypertension - Initially on labetalol drip and hydralazine IV PRN. Then started on Cardizem 60 mg Q 6 hours, Norvasc 10 mg QD and hydralazine as high as 75 mg every 6 hours which did not control BP adequately - We evan tried clonidine 05/17/2015 with no significant improvemnt in BP - He is not on lasix or lisinopril due to renal insufficiency  - Per cardiology recommendations patient will be on labetalol, Norvasc and hydralazine on discharge   Acute renal failure superimposed on stage 3 chronic kidney disease (HCC) - Baseline Cr 1.6 (about year ago) - Cr on this admission 1.8 and as high as 2.31 and now 2.07, needs to be repeated on outpt basis and pt aware of this, most likely new baseline   Anemia of chronic renal failure, stage 3 (moderate) - Hemoglobin 10.9   Pulmonary sarcoidosis/clinical dx only  - Stable    Thrombocytopenia - Unclear etiology - Platelets stable at 144  DVT prophylaxis:  - SCD's   Code Status: Full.  Family Communication: plan of care discussed with the patient   IV access:  Peripheral IV  Procedures and diagnostic studies:   Dg Chest 2 View 05/12/2015 Right hilar hazy airspace opacity concerning for pneumonia. Clinical correlation and follow-up recommended. Right hilar  adenopathy may be present. Electronically Signed By: Anner Crete M.D. On: 05/12/2015 03:45   Dg Chest Port 1 View 05/15/2015 1. Increasing asymmetric infiltrates or edema, left greater than right.  Electronically Signed By: Lucrezia Europe M.D. On: 05/15/2015 10:18   Medical Consultants:  Cardiology   Other Consultants:  None   IAnti-Infectives:   Levaquin 05/12/2015 --> 05/18/2015  Signed:  Leisa Lenz, MD  [facility] 05/19/2015, 10:49 AM  Pager #: [phone]  Time spent in minutes: more than 30 minutes   Discharge Exam: Filed Vitals:   05/19/15 0810 05/19/15 0956  BP: 177/115 178/99  Pulse:    Temp:    Resp:     Filed Vitals:   05/19/15 0531 05/19/15 0737 05/19/15 0810 05/19/15 0956  BP: 200/122 193/127 177/115 178/99  Pulse: 94 96    Temp: 98.5 F (36.9 C)     TempSrc: Oral     Resp: 20     Height:      Weight: 206 lb 8 oz (93.668 kg)     SpO2: 99%       General: Pt is alert, follows commands appropriately, not in acute distress Cardiovascular: Regular rate and rhythm, S1/S2 +, no murmurs Respiratory: Clear to auscultation bilaterally, no wheezing, no crackles, no rhonchi Abdominal: Soft, non tender, non distended, bowel sounds +, no guarding Extremities: no edema, no cyanosis, pulses palpable bilaterally DP and PT Neuro: Grossly nonfocal  Discharge Instructions  Discharge Instructions    Call MD for:  difficulty breathing, headache or visual disturbances    Complete by:  As directed      Call MD for:  persistant dizziness or light-headedness    Complete by:  As directed      Call MD for:  persistant nausea and vomiting    Complete by:  As directed      Call MD for:  severe uncontrolled pain    Complete by:  As directed      Diet - low sodium heart healthy    Complete by:  As directed      Discharge instructions    Complete by:  As directed        Increase activity slowly    Complete by:  As directed             Medication List    STOP taking these medications        furosemide 20 MG tablet  Commonly known as:  LASIX     ibuprofen 200 MG tablet  Commonly known as:  ADVIL,MOTRIN     lisinopril 20 MG tablet   Commonly known as:  PRINIVIL,ZESTRIL     nebivolol 10 MG tablet  Commonly known as:  BYSTOLIC      TAKE these medications        amLODipine 10 MG tablet  Commonly known as:  NORVASC  Take 1 tablet (10 mg total) by mouth daily.     docusate sodium 100 MG capsule  Commonly known as:  COLACE  Take 1 capsule (100 mg total) by mouth daily as needed for mild constipation.     fluticasone 50 MCG/ACT nasal spray  Commonly known as:  FLONASE  Place 1 spray into both nostrils daily.     hydrALAZINE 100 MG tablet  Commonly known as:  APRESOLINE  Take 1 tablet (100 mg total) by mouth every 8 (eight) hours.     labetalol 200 MG tablet  Commonly known as:  NORMODYNE  Take 2 tablets (400  mg total) by mouth 2 (two) times daily.             Follow-up Information    Follow up with [facility]. Schedule an appointment as soon as possible for a visit in 1 week.   Why:  Follow up appt after recent hospitalization   Contact information:   [address] [place] [phone]       The results of significant diagnostics from this hospitalization (including imaging, microbiology, ancillary and laboratory) are listed below for reference.    Significant Diagnostic Studies: Dg Chest 2 View  05/12/2015  CLINICAL DATA:  49 year old male with productive cough and chest tightness EXAM: CHEST  2 VIEW COMPARISON:  Chest radiograph dated 08/20/2013 FINDINGS: Two views of the chest demonstrate a focal area of haziness in the right infrahilar region concerning for pneumonia. There is right hilar prominence which may represent adenopathy. There is no pleural effusion or pneumothorax. Mild cardiomegaly. The osseous structures appear unremarkable. IMPRESSION: Right hilar hazy airspace opacity concerning for pneumonia. Clinical correlation and follow-up recommended. Right hilar adenopathy may be present. Electronically Signed   By: Anner Crete M.D.   On: 05/12/2015 03:45   Dg Chest Port 1 View  05/15/2015  CLINICAL DATA:  SOb and productive cough x 1 week; pt id checked with RN EXAM: PORTABLE CHEST - 1 VIEW COMPARISON:  05/12/2015 FINDINGS: Progressive interstitial and alveolar opacities throughout both lungs left greater than right, with some peripheral sparing. Heart size upper limits normal for technique. No definite effusion.  No pneumothorax. Visualized skeletal structures are unremarkable. IMPRESSION: 1. Increasing asymmetric infiltrates or edema, left greater than right. Electronically Signed   By: Lucrezia Europe M.D.   On: 05/15/2015 10:18    Microbiology: Recent Results (from the past 240 hour(s))  MRSA PCR Screening     Status: None   Collection Time: 05/12/15  9:27 AM  Result Value Ref Range Status   MRSA by PCR NEGATIVE NEGATIVE Final  Culture, sputum-assessment     Status: None   Collection Time: 05/13/15  4:38 AM  Result Value Ref Range Status   Specimen Description SPUTUM  Final   Special Requests NONE  Final   Sputum evaluation   Final    THIS SPECIMEN IS ACCEPTABLE. RESPIRATORY CULTURE REPORT TO FOLLOW.   Report Status 05/13/2015 FINAL  Final  Culture, respiratory (NON-Expectorated)     Status: None   Collection Time: 05/13/15  4:38 AM  Result Value Ref Range Status   Specimen Description SPUTUM  Final   Special Requests NONE  Final   Gram Stain   Final    ABUNDANT WBC PRESENT,BOTH PMN AND MONONUCLEAR FEW SQUAMOUS EPITHELIAL CELLS PRESENT RARE GRAM POSITIVE COCCI IN PAIRS Performed at [facility]    Culture   Final    NORMAL OROPHARYNGEAL FLORA Performed at [facility]    Report Status 05/15/2015 FINAL  Final     Labs: Basic Metabolic Panel:  Recent Labs Lab 05/13/15 0402 05/14/15 0332 05/15/15 0352 05/16/15 1012 05/17/15 0949  NA 139 133* 135 133* 136  K 4.1 4.3 4.4 3.9 4.0  CL 107 104 105 102 103  CO2 23 21* 21* 23 24  GLUCOSE 120* 111* 102* 101* 125*  BUN 24*  24* 22* 23* 25*  CREATININE 2.21* 2.31* 2.11* 1.88* 1.98*  CALCIUM 9.2 8.8* 8.8* 8.9 9.4   Liver Function Tests: No results for input(s): AST, ALT, ALKPHOS, BILITOT,  PROT, ALBUMIN in the last 168 hours. No results for input(s): LIPASE, AMYLASE in the last 168 hours. No results for input(s): AMMONIA in the last 168 hours.  CBC:  Recent Labs Lab 05/13/15 0402 05/14/15 0332 05/15/15 0352  WBC 5.6 5.9 5.4  HGB 11.1* 10.4* 10.9*  HCT 34.1* 31.9* 32.4*  MCV 91.4 91.4 91.3  PLT 144* 150 144*   Cardiac Enzymes: No results for input(s): CKTOTAL, CKMB, CKMBINDEX, TROPONINI in the last 168 hours.  BNP: BNP (last 3 results)  Recent Labs  05/12/15 0303  BNP 199.4*    ProBNP (last 3 results) No results for input(s): PROBNP in the last 8760 hours.  CBG:  Recent Labs Lab 05/15/15 0737 05/16/15 0740 05/17/15 0738 05/18/15 0749 05/19/15 0723  GLUCAP 106* 93 82 88 93

## 2015-05-19 NOTE — Discharge Instructions (Addendum)
Hypertension Hypertension, commonly called high blood pressure, is when the force of blood pumping through your arteries is too strong. Your arteries are the blood vessels that carry blood from your heart throughout your body. A blood pressure reading consists of a higher number over a lower number, such as 110/72. The higher number (systolic) is the pressure inside your arteries when your heart pumps. The lower number (diastolic) is the pressure inside your arteries when your heart relaxes. Ideally you want your blood pressure below 120/80. Hypertension forces your heart to work harder to pump blood. Your arteries may become narrow or stiff. Having untreated or uncontrolled hypertension can cause heart attack, stroke, kidney disease, and other problems. RISK FACTORS Some risk factors for high blood pressure are controllable. Others are not.  Risk factors you cannot control include:   Race. You may be at higher risk if you are African American.  Age. Risk increases with age.  Gender. Men are at higher risk than women before age 45 years. After age 65, women are at higher risk than men. Risk factors you can control include:  Not getting enough exercise or physical activity.  Being overweight.  Getting too much fat, sugar, calories, or salt in your diet.  Drinking too much alcohol. SIGNS AND SYMPTOMS Hypertension does not usually cause signs or symptoms. Extremely high blood pressure (hypertensive crisis) may cause headache, anxiety, shortness of breath, and nosebleed. DIAGNOSIS To check if you have hypertension, your health care provider will measure your blood pressure while you are seated, with your arm held at the level of your heart. It should be measured at least twice using the same arm. Certain conditions can cause a difference in blood pressure between your right and left arms. A blood pressure reading that is higher than normal on one occasion does not mean that you need treatment. If  it is not clear whether you have high blood pressure, you may be asked to return on a different day to have your blood pressure checked again. Or, you may be asked to monitor your blood pressure at home for 1 or more weeks. TREATMENT Treating high blood pressure includes making lifestyle changes and possibly taking medicine. Living a healthy lifestyle can help lower high blood pressure. You may need to change some of your habits. Lifestyle changes may include:  Following the DASH diet. This diet is high in fruits, vegetables, and whole grains. It is low in salt, red meat, and added sugars.  Keep your sodium intake below 2,300 mg per day.  Getting at least 30-45 minutes of aerobic exercise at least 4 times per week.  Losing weight if necessary.  Not smoking.  Limiting alcoholic beverages.  Learning ways to reduce stress. Your health care provider may prescribe medicine if lifestyle changes are not enough to get your blood pressure under control, and if one of the following is true:  You are 18-59 years of age and your systolic blood pressure is above 140.  You are 60 years of age or older, and your systolic blood pressure is above 150.  Your diastolic blood pressure is above 90.  You have diabetes, and your systolic blood pressure is over 140 or your diastolic blood pressure is over 90.  You have kidney disease and your blood pressure is above 140/90.  You have heart disease and your blood pressure is above 140/90. Your personal target blood pressure may vary depending on your medical conditions, your age, and other factors. HOME CARE INSTRUCTIONS    Have your blood pressure rechecked as directed by your health care provider.   Take medicines only as directed by your health care provider. Follow the directions carefully. Blood pressure medicines must be taken as prescribed. The medicine does not work as well when you skip doses. Skipping doses also puts you at risk for  problems.  Do not smoke.   Monitor your blood pressure at home as directed by your health care provider. SEEK MEDICAL CARE IF:   You think you are having a reaction to medicines taken.  You have recurrent headaches or feel dizzy.  You have swelling in your ankles.  You have trouble with your vision. SEEK IMMEDIATE MEDICAL CARE IF:  You develop a severe headache or confusion.  You have unusual weakness, numbness, or feel faint.  You have severe chest or abdominal pain.  You vomit repeatedly.  You have trouble breathing. MAKE SURE YOU:   Understand these instructions.  Will watch your condition.  Will get help right away if you are not doing well or get worse.   This information is not intended to replace advice given to you by your health care provider. Make sure you discuss any questions you have with your health care provider.   Document Released: 05/07/2005 Document Revised: 09/21/2014 Document Reviewed: 02/27/2013 Elsevier Interactive Patient Education 2016 Elsevier [employer]. Amlodipine tablets What is this medicine? AMLODIPINE (am LOE di peen) is a calcium-channel blocker. It affects the amount of calcium found in your heart and muscle cells. This relaxes your blood vessels, which can reduce the amount of work the heart has to do. This medicine is used to lower high blood pressure. It is also used to prevent chest pain. This medicine may be used for other purposes; ask your health care provider or pharmacist if you have questions. What should I tell my health care provider before I take this medicine? They need to know if you have any of these conditions: -heart problems like heart failure or aortic stenosis -liver disease -an unusual or allergic reaction to amlodipine, other medicines, foods, dyes, or preservatives -pregnant or trying to get pregnant -breast-feeding How should I use this medicine? Take this medicine by mouth with a glass of water. Follow the  directions on the prescription label. Take your medicine at regular intervals. Do not take more medicine than directed. Talk to your pediatrician regarding the use of this medicine in children. Special care may be needed. This medicine has been used in children as young as 6. Persons over 22 years old may have a stronger reaction to this medicine and need smaller doses. Overdosage: If you think you have taken too much of this medicine contact a poison control center or emergency room at once. NOTE: This medicine is only for you. Do not share this medicine with others. What if I miss a dose? If you miss a dose, take it as soon as you can. If it is almost time for your next dose, take only that dose. Do not take double or extra doses. What may interact with this medicine? -herbal or dietary supplements -local or general anesthetics -medicines for high blood pressure -medicines for prostate problems -rifampin This list may not describe all possible interactions. Give your health care provider a list of all the medicines, herbs, non-prescription drugs, or dietary supplements you use. Also tell them if you smoke, drink alcohol, or use illegal drugs. Some items may interact with your medicine. What should I watch for while using this medicine?  Visit your doctor or health care professional for regular check ups. Check your blood pressure and pulse rate regularly. Ask your health care professional what your blood pressure and pulse rate should be, and when you should contact him or her. This medicine may make you feel confused, dizzy or lightheaded. Do not drive, use machinery, or do anything that needs mental alertness until you know how this medicine affects you. To reduce the risk of dizzy or fainting spells, do not sit or stand up quickly, especially if you are an older patient. Avoid alcoholic drinks; they can make you more dizzy. Do not suddenly stop taking amlodipine. Ask your doctor or health care  professional how you can gradually reduce the dose. What side effects may I notice from receiving this medicine? Side effects that you should report to your doctor or health care professional as soon as possible: -allergic reactions like skin rash, itching or hives, swelling of the face, lips, or tongue -breathing problems -changes in vision or hearing -chest pain -fast, irregular heartbeat -swelling of legs or ankles Side effects that usually do not require medical attention (report to your doctor or health care professional if they continue or are bothersome): -dry mouth -facial flushing -nausea, vomiting -stomach gas, pain -tired, weak -trouble sleeping This list may not describe all possible side effects. Call your doctor for medical advice about side effects. You may report side effects to FDA at 1-800-FDA-1088. Where should I keep my medicine? Keep out of the reach of children. Store at room temperature between 59 and 86 degrees F (15 and 30 degrees C). Protect from light. Keep container tightly closed. Throw away any unused medicine after the expiration date. NOTE: This sheet is a summary. It may not cover all possible information. If you have questions about this medicine, talk to your doctor, pharmacist, or health care provider.    2016, Elsevier/Gold Standard. (2012-04-04 11:40:58) Hydralazine tablets What is this medicine? HYDRALAZINE (hye DRAL a zeen) is a type of vasodilator. It relaxes blood vessels, increasing the blood and oxygen supply to your heart. This medicine is used to treat high blood pressure. This medicine may be used for other purposes; ask your health care provider or pharmacist if you have questions. What should I tell my health care provider before I take this medicine? They need to know if you have any of these conditions: -blood vessel disease -heart disease including angina or history of heart attack -kidney or liver disease -systemic lupus  erythematosus (SLE) -an unusual or allergic reaction to hydralazine, tartrazine dye, other medicines, foods, dyes, or preservatives -pregnant or trying to get pregnant -breast-feeding How should I use this medicine? Take this medicine by mouth with a glass of water. Follow the directions on the prescription label. Take your doses at regular intervals. Do not take your medicine more often than directed. Do not stop taking except on the advice of your doctor or health care professional. Talk to your pediatrician regarding the use of this medicine in children. Special care may be needed. While this drug may be prescribed for children for selected conditions, precautions do apply. Overdosage: If you think you have taken too much of this medicine contact a poison control center or emergency room at once. NOTE: This medicine is only for you. Do not share this medicine with others. What if I miss a dose? If you miss a dose, take it as soon as you can. If it is almost time for your next dose, take only  that dose. Do not take double or extra doses. What may interact with this medicine? -medicines for high blood pressure -medicines for mental depression This list may not describe all possible interactions. Give your health care provider a list of all the medicines, herbs, non-prescription drugs, or dietary supplements you use. Also tell them if you smoke, drink alcohol, or use illegal drugs. Some items may interact with your medicine. What should I watch for while using this medicine? Visit your doctor or health care professional for regular checks on your progress. Check your blood pressure and pulse rate regularly. Ask your doctor or health care professional what your blood pressure and pulse rate should be and when you should contact him or her. You may get drowsy or dizzy. Do not drive, use machinery, or do anything that needs mental alertness until you know how this medicine affects you. Do not stand or  sit up quickly, especially if you are an older patient. This reduces the risk of dizzy or fainting spells. Alcohol may interfere with the effect of this medicine. Avoid alcoholic drinks. Do not treat yourself for coughs, colds, or pain while you are taking this medicine without asking your doctor or health care professional for advice. Some ingredients may increase your blood pressure. What side effects may I notice from receiving this medicine? Side effects that you should report to your doctor or health care professional as soon as possible: -chest pain, or fast or irregular heartbeat -fever, chills, or sore throat -numbness or tingling in the hands or feet -shortness of breath -skin rash, redness, blisters or itching -stiff or swollen joints -sudden weight gain -swelling of the feet or legs -swollen lymph glands -unusual weakness Side effects that usually do not require medical attention (report to your doctor or health care professional if they continue or are bothersome): -diarrhea, or constipation -headache -loss of appetite -nausea, vomiting This list may not describe all possible side effects. Call your doctor for medical advice about side effects. You may report side effects to FDA at 1-800-FDA-1088. Where should I keep my medicine? Keep out of the reach of children. Store at room temperature between 15 and 30 degrees C (59 and 86 degrees F). Throw away any unused medicine after the expiration date. NOTE: This sheet is a summary. It may not cover all possible information. If you have questions about this medicine, talk to your doctor, pharmacist, or health care provider.    2016, Elsevier/Gold Standard. (2007-09-19 15:44:58) Metoprolol extended-release tablets What is this medicine? METOPROLOL (me TOE proe lole) is a beta-blocker. Beta-blockers reduce the workload on the heart and help it to beat more regularly. This medicine is used to treat high blood pressure and to prevent  chest pain. It is also used to after a heart attack and to prevent an additional heart attack from occurring. This medicine may be used for other purposes; ask your health care provider or pharmacist if you have questions. What should I tell my health care provider before I take this medicine? They need to know if you have any of these conditions: -diabetes -heart or vessel disease like slow heart rate, worsening heart failure, heart block, sick sinus syndrome or Raynaud's disease -kidney disease -liver disease -lung or breathing disease, like asthma or emphysema -pheochromocytoma -thyroid disease -an unusual or allergic reaction to metoprolol, other beta-blockers, medicines, foods, dyes, or preservatives -pregnant or trying to get pregnant -breast-feeding How should I use this medicine? Take this medicine by mouth with a glass  of water. Follow the directions on the prescription label. Do not crush or chew. Take this medicine with or immediately after meals. Take your doses at regular intervals. Do not take more medicine than directed. Do not stop taking this medicine suddenly. This could lead to serious heart-related effects. Talk to your pediatrician regarding the use of this medicine in children. While this drug may be prescribed for children as young as 6 years for selected conditions, precautions do apply. Overdosage: If you think you have taken too much of this medicine contact a poison control center or emergency room at once. NOTE: This medicine is only for you. Do not share this medicine with others. What if I miss a dose? If you miss a dose, take it as soon as you can. If it is almost time for your next dose, take only that dose. Do not take double or extra doses. What may interact with this medicine? This medicine may interact with the following medications: -certain medicines for blood pressure, heart disease, irregular heart beat -certain medicines for depression, like monoamine  oxidase (MAO) inhibitors, fluoxetine, or paroxetine -clonidine -dobutamine -epinephrine -isoproterenol -reserpine This list may not describe all possible interactions. Give your health care provider a list of all the medicines, herbs, non-prescription drugs, or dietary supplements you use. Also tell them if you smoke, drink alcohol, or use illegal drugs. Some items may interact with your medicine. What should I watch for while using this medicine? Visit your doctor or health care professional for regular check ups. Contact your doctor right away if your symptoms worsen. Check your blood pressure and pulse rate regularly. Ask your health care professional what your blood pressure and pulse rate should be, and when you should contact them. You may get drowsy or dizzy. Do not drive, use machinery, or do anything that needs mental alertness until you know how this medicine affects you. Do not sit or stand up quickly, especially if you are an older patient. This reduces the risk of dizzy or fainting spells. Contact your doctor if these symptoms continue. Alcohol may interfere with the effect of this medicine. Avoid alcoholic drinks. What side effects may I notice from receiving this medicine? Side effects that you should report to your doctor or health care professional as soon as possible: -allergic reactions like skin rash, itching or hives -cold or numb hands or feet -depression -difficulty breathing -faint -fever with sore throat -irregular heartbeat, chest pain -rapid weight gain -swollen legs or ankles Side effects that usually do not require medical attention (report to your doctor or health care professional if they continue or are bothersome): -anxiety or nervousness -change in sex drive or performance -dry skin -headache -nightmares or trouble sleeping -short term memory loss -stomach upset or diarrhea -unusually tired This list may not describe all possible side effects. Call your  doctor for medical advice about side effects. You may report side effects to FDA at 1-800-FDA-1088. Where should I keep my medicine? Keep out of the reach of children. Store at room temperature between 15 and 30 degrees C (59 and 86 degrees F). Throw away any unused medicine after the expiration date. NOTE: This sheet is a summary. It may not cover all possible information. If you have questions about this medicine, talk to your doctor, pharmacist, or health care provider.    2016, Elsevier/Gold Standard. (2013-01-09 14:41:37)

## 2015-05-19 NOTE — Progress Notes (Signed)
Discharge completed but awaiting final recommendation from cardio. Pt SBP still high 180's so will have to see what cardio recommends at this point. Discharge on hold. Leisa Lenz [facility] [id number]

## 2015-05-19 NOTE — Progress Notes (Signed)
Patient Name: Ian Gregory. Date of Encounter: 05/19/2015  Principal Problem:   Acute respiratory failure with hypoxemia (HCC) Active Problems:   Pulmonary sarcoidosis/clinical dx only    Accelerated hypertension   Acute renal failure superimposed on stage 3 chronic kidney disease (HCC)   Anemia of chronic renal failure, stage 3 (moderate)   SIRS (systemic inflammatory response syndrome) (HCC)   Lobar pneumonia, unspecified organism ([place])   Thrombocytopenia ([place])   Chest cold   Hypertensive emergency    Primary Cardiologist: [facility] - Dr. Debara Pickett Patient Profile: 49 y.o. male with past medical history of HTN, Sarcoidosis, and Stage 3 CKD who presented to [place] ED on 05/12/2015 for worsening shortness of breath and a productive cough over the past week. Treated for PNA but has had resistant accelerated HTN.  SUBJECTIVE: Denies any chest pain, palpitations, headaches, or dizziness. Patient is anxious to go home due to financial concerns.  OBJECTIVE Filed Vitals:   05/19/15 0531 05/19/15 0737 05/19/15 0810 05/19/15 0956  BP: 200/122 193/127 177/115 178/99  Pulse: 94 96    Temp: 98.5 F (36.9 C)     TempSrc: Oral     Resp: 20     Height:      Weight: 206 lb 8 oz (93.668 kg)     SpO2: 99%       Intake/Output Summary (Last 24 hours) at 05/19/15 0957 Last data filed at 05/18/15 1830  Gross per 24 hour  Intake   1080 ml  Output      0 ml  Net   1080 ml   Filed Weights   05/17/15 0500 05/18/15 0523 05/19/15 0531  Weight: 215 lb (97.523 kg) 207 lb 3.2 oz (93.985 kg) 206 lb 8 oz (93.668 kg)    PHYSICAL EXAM General: Well developed, well nourished, male in no acute distress. Head: Normocephalic, atraumatic.  Neck: Supple without bruits, JVD not elevated. Lungs:  Resp regular and unlabored, CTA without wheezing or rales. Heart: RRR, S1, S2, no S3, S4, or murmur; no rub. Abdomen: Soft, non-tender, non-distended with normoactive bowel sounds. No hepatomegaly. No  rebound/guarding. No obvious abdominal masses. Extremities: No clubbing, cyanosis, or edema. Distal pedal pulses are 2+ bilaterally. Neuro: Alert and oriented X 3. Moves all extremities spontaneously. Psych: Normal affect.  LABS: Basic Metabolic Panel: Recent Labs  05/16/15 1012 05/17/15 0949  NA 133* 136  K 3.9 4.0  CL 102 103  CO2 23 24  GLUCOSE 101* 125*  BUN 23* 25*  CREATININE 1.88* 1.98*  CALCIUM 8.9 9.4   BNP:  B NATRIURETIC PEPTIDE  Date/Time Value Ref Range Status  05/12/2015 03:03 AM 199.4* 0.0 - 100.0 pg/mL Final    TELE: NSR with rate in 90's - 120's. 5 beats of SVT overnight.  ECHO: 05/18/2015 Study Conclusions - Left ventricle: The cavity size was normal. Wall thickness was increased in a pattern of moderate LVH. Systolic function was normal. The estimated ejection fraction was in the range of 60% to 65%. Wall motion was normal; there were no regional wall motion abnormalities. - Atrial septum: No defect or patent foramen ovale was identified.    Current Medications:  . amLODipine  10 mg Oral Daily  . docusate sodium  100 mg Oral BID  . hydrALAZINE  75 mg Oral 3 times per day  . sodium chloride  3 mL Intravenous Q12H      ASSESSMENT AND PLAN:  1. Accelerated HTN - was on Bystolic 1 year ago  but quit taking the medication secondary to financial reasons. BP was still in the 160's at that time. - has been on Cardizem, Hydralazine, Clonidine, and Amlodipine this admission. - Cardizem and Clonidine were discontinued on 05/18/2015. Restarted on Amlodipine 10mg  daily and Hydralazine 75mg  TID on 05/18/2015. No ACE or ARB at this time due to elevated creatinine. - BP has been 152/99 - 200/127 in the past 24 hours. Has also been tachycardiac into the 120's with 6 beats of SVT. Will start Metoprolol 25mg  BID. - will need outpatient work-up for secondary HTN as outpatient.  2. Hypertensive Heart Disease - echo showed wall thickness of the LV in a  pattern of moderate LVH. EF was 60-65% with no wall motion abnormalities.  3. Acute Hypoxic Respiratory Failure secondary to lobar PNA - significantly improved since admission - on Abx treatment - per admitting team  4. Acute on Chronic Stage 3 CKD - baseline around 1.6 - peaked at 2.31 this admission. 1.98 on 05/17/2015. Will recheck in AM with morning labs.  Signed, Erma Heritage , PA-C 9:57 AM 05/19/2015 Pager: [phone]

## 2015-05-19 NOTE — Care Management Note (Signed)
Case Management Note  Patient Details  Name: Ian Gregory. MRN: [id number] Date of Birth: [birth year]  Subjective/Objective:                    Action/Plan:d/c home no d/c needs or orders.   Expected Discharge Date:                  Expected Discharge Plan:  Home/Self Care  In-House Referral:  NA, PCP / [facility]  Discharge planning Services  CM Consult  Post Acute Care Choice:  NA Choice offered to:  NA  DME Arranged:    DME Agency:     HH Arranged:    [place] Agency:     Status of Service:  Completed, signed off  Medicare Important Message Given:    Date Medicare IM Given:    Medicare IM give by:    Date Additional Medicare IM Given:    Additional Medicare Important Message give by:     If discussed at [place] of Stay Meetings, dates discussed:    Additional Comments:  Dessa Phi, RN 05/19/2015, 12:27 PM

## 2015-05-20 ENCOUNTER — Inpatient Hospital Stay (HOSPITAL_COMMUNITY): Payer: Commercial Managed Care - PPO

## 2015-05-20 LAB — BASIC METABOLIC PANEL
Anion gap: 10 (ref 5–15)
BUN: 34 mg/dL — AB (ref 6–20)
CHLORIDE: 105 mmol/L (ref 101–111)
CO2: 23 mmol/L (ref 22–32)
CREATININE: 2.07 mg/dL — AB (ref 0.61–1.24)
Calcium: 9.2 mg/dL (ref 8.9–10.3)
GFR calc Af Amer: 42 mL/min — ABNORMAL LOW (ref >60)
GFR calc non Af Amer: 36 mL/min — ABNORMAL LOW (ref >60)
Glucose, Bld: 98 mg/dL (ref 65–99)
Potassium: 4.1 mmol/L (ref 3.5–5.1)
SODIUM: 138 mmol/L (ref 135–145)

## 2015-05-20 LAB — GLUCOSE, CAPILLARY: Glucose-Capillary: 94 mg/dL (ref 65–99)

## 2015-05-20 MED ORDER — LABETALOL HCL 200 MG PO TABS: 400.0000 mg | ORAL_TABLET | Freq: Two times a day (BID) | ORAL | Status: DC

## 2015-05-20 MED ORDER — HYDRALAZINE HCL 100 MG PO TABS: 100.0000 mg | ORAL_TABLET | Freq: Three times a day (TID) | ORAL | Status: DC

## 2015-05-20 MED ORDER — HYDRALAZINE HCL 25 MG PO TABS
25.0000 mg | ORAL_TABLET | Freq: Once | ORAL | Status: AC
  Administered 2015-05-20: 25 mg via ORAL
  Filled 2015-05-20: qty 1

## 2015-05-20 MED ORDER — HYDRALAZINE HCL 50 MG PO TABS
100.0000 mg | ORAL_TABLET | Freq: Three times a day (TID) | ORAL | Status: DC
  Administered 2015-05-20: 100 mg via ORAL
  Filled 2015-05-20: qty 2

## 2015-05-20 MED ORDER — LABETALOL HCL 200 MG PO TABS
400.0000 mg | ORAL_TABLET | Freq: Two times a day (BID) | ORAL | Status: DC
  Administered 2015-05-20: 400 mg via ORAL
  Filled 2015-05-20: qty 2

## 2015-05-20 NOTE — Progress Notes (Signed)
Awaiting cardiology recommendation for discharge. Patient is currently on Norvasc 10 mg daily, labetalol 400 mg twice daily and hydralazine 100 mg every 8 hours which brought his blood pressure down to 165/109. Renal ultrasound with no acute findings no evidence of renal artery stenosis.  Updated discharge summary to reflect the above blood pressure medications. Please refer to discharge summary completed 05/19/2015.  Leisa Lenz [facility] [id number]

## 2015-05-20 NOTE — Progress Notes (Signed)
Patient Name: Ian Gregory. Date of Encounter: 05/20/2015  Principal Problem:   Acute respiratory failure with hypoxemia (HCC) Active Problems:   Pulmonary sarcoidosis/clinical dx only    Accelerated hypertension   Acute renal failure superimposed on stage 3 chronic kidney disease (HCC)   Anemia of chronic renal failure, stage 3 (moderate)   SIRS (systemic inflammatory response syndrome) (HCC)   Lobar pneumonia, unspecified organism ([place])   Thrombocytopenia ([place])   Chest cold   Hypertensive emergency   Hypertensive crisis without congestive heart failure   Primary Cardiologist: New - Dr. Debara Pickett  Patient Profile: 49 y.o. male with past medical history of HTN, Sarcoidosis, and Stage 3 CKD who presented to [facility] ED on 05/12/2015 for worsening shortness of breath and a productive cough over the past week. Treated for PNA but has had resistant accelerated HTN.   SUBJECTIVE: No chest pain or SOB, cough is improving  OBJECTIVE Filed Vitals:   05/19/15 1416 05/19/15 1459 05/19/15 2100 05/20/15 0544  BP: 185/127 154/95 150/89 165/109  Pulse: 96 96 87 84  Temp:  98.4 F (36.9 C) 97.9 F (36.6 C) 98.5 F (36.9 C)  TempSrc:  Oral Oral Oral  Resp:  20 20 20   Height:      Weight:    207 lb 0.2 oz (93.9 kg)  SpO2:  98% 98% 97%    Intake/Output Summary (Last 24 hours) at 05/20/15 0721 Last data filed at 05/19/15 1730  Gross per 24 hour  Intake    360 ml  Output      0 ml  Net    360 ml   Filed Weights   05/18/15 0523 05/19/15 0531 05/20/15 0544  Weight: 207 lb 3.2 oz (93.985 kg) 206 lb 8 oz (93.668 kg) 207 lb 0.2 oz (93.9 kg)    PHYSICAL EXAM General: Well developed, well nourished, male in no acute distress. Head: Normocephalic, atraumatic.  Neck: Supple without bruits, JVD not elevated. Lungs:  Resp regular and unlabored, rales noted, few rhonchi, improve w/ cough. Heart: RRR, S1, S2, no S3, S4, or murmur; no rub. Abdomen: Soft, non-tender,  non-distended, BS + x 4.  Extremities: No clubbing, cyanosis, edema.  Neuro: Alert and oriented X 3. Moves all extremities spontaneously. Psych: Normal affect.  LABS: CBC: Lab Results  Component Value Date   WBC 5.4 05/15/2015   HGB 10.9* 05/15/2015   HCT 32.4* 05/15/2015   MCV 91.3 05/15/2015   PLT 144* [id number]    Basic Metabolic Panel: Recent Labs  05/17/15 0949 05/20/15 0515  NA 136 138  K 4.0 4.1  CL 103 105  CO2 24 23  GLUCOSE 125* 98  BUN 25* 34*  CREATININE 1.98* 2.07*  CALCIUM 9.4 9.2   BNP:  B NATRIURETIC PEPTIDE  Date/Time Value Ref Range Status  05/12/2015 03:03 AM 199.4* 0.0 - 100.0 pg/mL Final   TELE:  SR, ST, no sig ectopy      Current Medications:  . amLODipine  10 mg Oral Daily  . docusate sodium  100 mg Oral BID  . hydrALAZINE  75 mg Oral 3 times per day  . labetalol  300 mg Oral BID  . sodium chloride  3 mL Intravenous Q12H      ASSESSMENT AND PLAN: Pt for d/c today by IM, they request final d/c med recs from [place].  1. Accelerated HTN - was on Bystolic 1 year ago but quit taking the medication secondary to financial reasons. BP was  still in the 160's at that time. - has been on Cardizem>>amlodipine, Hydralazine, Clonidine (d/c'd), and labetalol this admission. - Restarted on Amlodipine 10mg  daily and Hydralazine 75mg  TID on 05/18/2015. - No ACE or ARB at this time due to elevated creatinine. - BP has been 150/89 - 200/122 in the past 24 hours. Tachycardia improved on labetalol>>increase to 400 mg bid; will also increase hydralazine to 100 mg tid - MD advise on renal US - will need outpatient work-up for secondary HTN as outpatient - however, pt states both parents and several sisters have severe HTN  2. Hypertensive Heart Disease - echo showed wall thickness of the LV in a pattern of moderate LVH. EF was 60-65% with no wall motion abnormalities.  3. Acute Hypoxic Respiratory Failure secondary to lobar PNA - significantly improved since  admission - on Abx treatment - per admitting team  4. Acute on Chronic Stage 3 CKD - baseline around 1.6 - peaked at 2.31 this admission. 1.98 on 05/17/2015. 2.07 this AM.  5. Anemia  - pt not anemic a year ago - likely of chronic disease  Principal Problem:   Acute respiratory failure with hypoxemia (HCC) Active Problems:   Pulmonary sarcoidosis/clinical dx only    Accelerated hypertension   Acute renal failure superimposed on stage 3 chronic kidney disease (HCC)   Anemia of chronic renal failure, stage 3 (moderate)   SIRS (systemic inflammatory response syndrome) (HCC)   Lobar pneumonia, unspecified organism ([place])   Thrombocytopenia ([place])   Chest cold   Hypertensive emergency   Hypertensive crisis without congestive heart failure   Signed, Rosaria Ferries , PA-C 7:21 AM 05/20/2015

## 2015-05-20 NOTE — Progress Notes (Signed)
Patient and family educated about new BP meds. Patient set up PCP for himself. He understood what BP meds to take tonight and starting tomorrow.

## 2015-12-26 ENCOUNTER — Other Ambulatory Visit: Payer: Self-pay | Admitting: Nephrology

## 2015-12-26 DIAGNOSIS — I158 Other secondary hypertension: Secondary | ICD-10-CM

## 2017-02-08 ENCOUNTER — Encounter (HOSPITAL_COMMUNITY): Payer: Self-pay

## 2017-02-08 ENCOUNTER — Emergency Department (HOSPITAL_COMMUNITY): Payer: Commercial Managed Care - PPO

## 2017-02-08 ENCOUNTER — Emergency Department (HOSPITAL_COMMUNITY)
Admission: EM | Admit: 2017-02-08 | Discharge: 2017-02-08 | Disposition: A | Discharge status: Home / Self Care | Payer: Commercial Managed Care - PPO | Attending: Emergency Medicine | Admitting: Emergency Medicine

## 2017-02-08 ENCOUNTER — Emergency Department (HOSPITAL_BASED_OUTPATIENT_CLINIC_OR_DEPARTMENT_OTHER)
Admit: 2017-02-08 | Discharge: 2017-02-08 | Disposition: A | Discharge status: Home / Self Care | Payer: Commercial Managed Care - PPO | Attending: Emergency Medicine | Admitting: Emergency Medicine

## 2017-02-08 DIAGNOSIS — M6210 Other rupture of muscle (nontraumatic), unspecified site: Secondary | ICD-10-CM | POA: Diagnosis not present

## 2017-02-08 DIAGNOSIS — N183 Chronic kidney disease, stage 3 (moderate): Secondary | ICD-10-CM | POA: Insufficient documentation

## 2017-02-08 DIAGNOSIS — Z79899 Other long term (current) drug therapy: Secondary | ICD-10-CM | POA: Insufficient documentation

## 2017-02-08 DIAGNOSIS — M79609 Pain in unspecified limb: Secondary | ICD-10-CM | POA: Diagnosis not present

## 2017-02-08 DIAGNOSIS — M7981 Nontraumatic hematoma of soft tissue: Secondary | ICD-10-CM | POA: Diagnosis not present

## 2017-02-08 DIAGNOSIS — I129 Hypertensive chronic kidney disease with stage 1 through stage 4 chronic kidney disease, or unspecified chronic kidney disease: Secondary | ICD-10-CM | POA: Diagnosis not present

## 2017-02-08 DIAGNOSIS — T148XXA Other injury of unspecified body region, initial encounter: Secondary | ICD-10-CM

## 2017-02-08 DIAGNOSIS — M79651 Pain in right thigh: Secondary | ICD-10-CM | POA: Diagnosis present

## 2017-02-08 LAB — CK: Total CK: 567 U/L — ABNORMAL HIGH (ref 49–397)

## 2017-02-08 LAB — CBC WITH DIFFERENTIAL/PLATELET
BASOS PCT: 0 %
Basophils Absolute: 0 10*3/uL (ref 0.0–0.1)
EOS ABS: 0.1 10*3/uL (ref 0.0–0.7)
EOS PCT: 2 %
HCT: 41.9 % (ref 39.0–52.0)
Hemoglobin: 14 g/dL (ref 13.0–17.0)
LYMPHS ABS: 1.1 10*3/uL (ref 0.7–4.0)
Lymphocytes Relative: 25 %
MCH: 30 pg (ref 26.0–34.0)
MCHC: 33.4 g/dL (ref 30.0–36.0)
MCV: 89.9 fL (ref 78.0–100.0)
MONOS PCT: 5 %
Monocytes Absolute: 0.2 10*3/uL (ref 0.1–1.0)
Neutro Abs: 3.1 10*3/uL (ref 1.7–7.7)
Neutrophils Relative %: 68 %
PLATELETS: 123 10*3/uL — AB (ref 150–400)
RBC: 4.66 MIL/uL (ref 4.22–5.81)
RDW: 15 % (ref 11.5–15.5)
WBC: 4.5 10*3/uL (ref 4.0–10.5)

## 2017-02-08 LAB — COMPREHENSIVE METABOLIC PANEL
ALT: 20 U/L (ref 17–63)
AST: 40 U/L (ref 15–41)
Albumin: 4.1 g/dL (ref 3.5–5.0)
Alkaline Phosphatase: 56 U/L (ref 38–126)
Anion gap: 9 (ref 5–15)
BUN: 28 mg/dL — AB (ref 6–20)
CALCIUM: 9.2 mg/dL (ref 8.9–10.3)
CHLORIDE: 105 mmol/L (ref 101–111)
CO2: 23 mmol/L (ref 22–32)
CREATININE: 2.57 mg/dL — AB (ref 0.61–1.24)
GFR calc Af Amer: 32 mL/min — ABNORMAL LOW (ref >60)
GFR, EST NON AFRICAN AMERICAN: 27 mL/min — AB (ref >60)
Glucose, Bld: 140 mg/dL — ABNORMAL HIGH (ref 65–99)
Potassium: 4.2 mmol/L (ref 3.5–5.1)
Sodium: 137 mmol/L (ref 135–145)
Total Bilirubin: 1.8 mg/dL — ABNORMAL HIGH (ref 0.3–1.2)
Total Protein: 7.8 g/dL (ref 6.5–8.1)

## 2017-02-08 MED ORDER — HYDROCODONE-ACETAMINOPHEN 5-325 MG PO TABS: 1.0000 | ORAL_TABLET | Freq: Four times a day (QID) | ORAL | 0 refills | Status: DC | PRN

## 2017-02-08 MED ORDER — LABETALOL HCL 5 MG/ML IV SOLN
10.0000 mg | Freq: Once | INTRAVENOUS | Status: AC
  Administered 2017-02-08: 10 mg via INTRAVENOUS
  Filled 2017-02-08: qty 4

## 2017-02-08 MED ORDER — CARVEDILOL 12.5 MG PO TABS
12.5000 mg | ORAL_TABLET | Freq: Two times a day (BID) | ORAL | Status: DC
  Administered 2017-02-08: 12.5 mg via ORAL
  Filled 2017-02-08 (×2): qty 1

## 2017-02-08 MED ORDER — LISINOPRIL 20 MG PO TABS
40.0000 mg | ORAL_TABLET | Freq: Every day | ORAL | Status: DC
  Administered 2017-02-08: 40 mg via ORAL
  Filled 2017-02-08: qty 2

## 2017-02-08 MED ORDER — AMLODIPINE BESYLATE 5 MG PO TABS
10.0000 mg | ORAL_TABLET | Freq: Every day | ORAL | Status: DC
  Administered 2017-02-08: 10 mg via ORAL
  Filled 2017-02-08: qty 2

## 2017-02-08 MED ORDER — MORPHINE SULFATE (PF) 4 MG/ML IV SOLN
4.0000 mg | Freq: Once | INTRAVENOUS | Status: AC
  Administered 2017-02-08: 4 mg via INTRAVENOUS
  Filled 2017-02-08 (×2): qty 1

## 2017-02-08 NOTE — ED Provider Notes (Signed)
[facility] DEPT Provider Note   CSN: [id number] Arrival date & time: 02/08/17  0757     History   Chief Complaint Chief Complaint  Patient presents with  . Leg Pain    HPI Ian Boy. is a 51 y.o. male with a history of HTN, CKD3, Sarcoidosis, who presents today for evaluation of right Posterior thigh pain. He reports that it feels like "bad charley horse. He reports that it started last night and has gradually worsened. He denies any recent trauma.  He didn't take his blood pressure meds (lisonopril, coreg, norvasc) this morning.  He denies fevers or chills, reports that he is otherwise healthy.    HPI  Past Medical History:  Diagnosis Date  . CKD (chronic kidney disease) stage 3, GFR 30-59 ml/min   . HTN (hypertension)   . Sarcoidosis     Patient Active Problem List   Diagnosis Date Noted  . Hypertensive crisis without congestive heart failure   . Chest cold   . Hypertensive emergency   . Thrombocytopenia ([place]) 05/13/2015  . Accelerated hypertension 05/12/2015  . Acute renal failure superimposed on stage 3 chronic kidney disease ([place]) 05/12/2015  . Anemia of chronic renal failure, stage 3 (moderate) 05/12/2015  . Acute respiratory failure with hypoxemia ([place]) 05/12/2015  . SIRS (systemic inflammatory response syndrome) ([place]) 05/12/2015  . Lobar pneumonia, unspecified organism ([place]) 05/12/2015  . Pulmonary sarcoidosis/clinical dx only  08/07/2013    History reviewed. No pertinent surgical history.     Home Medications    Prior to Admission medications   Medication Sig Start Date End Date Taking? Authorizing Provider  amLODipine (NORVASC) 10 MG tablet Take 1 tablet (10 mg total) by mouth daily. 05/19/15  Yes Robbie Lis, MD  carvedilol (COREG) 6.25 MG tablet Take 12.5 mg by mouth 2 (two) times daily with a meal.   Yes [provider]  lisinopril (PRINIVIL,ZESTRIL) 40 MG tablet Take 40 mg by mouth daily.   Yes [provider]    hydrALAZINE (APRESOLINE) 100 MG tablet Take 1 tablet (100 mg total) by mouth every 8 (eight) hours. Patient not taking: Reported on 02/08/2017 05/20/15   Robbie Lis, MD  HYDROcodone-acetaminophen (NORCO/VICODIN) 5-325 MG tablet Take 1 tablet by mouth every 6 (six) hours as needed for severe pain. 02/08/17   Lorin Glass, PA-C  labetalol (NORMODYNE) 200 MG tablet Take 2 tablets (400 mg total) by mouth 2 (two) times daily. Patient not taking: Reported on 02/08/2017 05/20/15   Robbie Lis, MD    Family History Family History  Problem Relation Age of Onset  . Hypertension Mother   . Kidney failure Mother   . Diabetes Father   . Hypertension Father     Social History Social History  Substance Use Topics  . Smoking status: Never Smoker  . Smokeless tobacco: Never Used  . Alcohol use Yes     Comment: beer on wkends     Allergies   Patient has no known allergies.   Review of Systems Review of Systems  Constitutional: Negative for chills and fever.  HENT: Negative for ear pain and sore throat.   Eyes: Negative for pain and visual disturbance.  Respiratory: Negative for cough and shortness of breath.   Cardiovascular: Negative for chest pain and palpitations.  Gastrointestinal: Negative for abdominal pain and vomiting.  Genitourinary: Negative for dysuria and hematuria.  Musculoskeletal: Positive for myalgias. Negative for arthralgias and back pain.       Cramping  pain in right posterior thigh, firmness  Skin: Negative for color change and rash.  Neurological: Negative for seizures, syncope, weakness and numbness.  All other systems reviewed and are negative.    Physical Exam Updated Vital Signs BP (!) 191/123 (BP Location: Right Arm)   Pulse 86   Temp 98.5 F (36.9 C) (Oral)   Resp 18   Ht 6\' 3"  (1.905 m)   Wt 95.3 kg (210 lb)   SpO2 97%   BMI 26.25 kg/m   Physical Exam  Constitutional: He appears well-developed and well-nourished.  HENT:  Head:  Normocephalic and atraumatic.  Right Ear: External ear normal.  Left Ear: External ear normal.  Eyes: Pupils are equal, round, and reactive to light. Conjunctivae and EOM are normal. Right eye exhibits no discharge. Left eye exhibits no discharge. No scleral icterus.  Neck: Normal range of motion. Neck supple. No tracheal deviation present.  Cardiovascular: Normal rate, regular rhythm and intact distal pulses.   No murmur heard. 2+ DP and PT pulses bilaterally. Both feet are warm and well perfused.  Pulmonary/Chest: Effort normal and breath sounds normal. No respiratory distress. He has no wheezes.  Abdominal: Soft. He exhibits no distension. There is no tenderness. There is no guarding.  Musculoskeletal:  There is obvious swelling over the right posterior thigh. This area is firm, tender to palpation.  Lymphadenopathy:    He has no cervical adenopathy.  Neurological: He is alert. No cranial nerve deficit or sensory deficit. He exhibits normal muscle tone.  Sensation intact To bilateral lower extremities.    Skin: Skin is warm and dry.  Psychiatric: He has a normal mood and affect. His behavior is normal.  Nursing note and vitals reviewed.    ED Treatments / Results  Labs (all labs ordered are listed, but only abnormal results are displayed) Labs Reviewed  COMPREHENSIVE METABOLIC PANEL - Abnormal; Notable for the following:       Result Value   Glucose, Bld 140 (*)    BUN 28 (*)    Creatinine, Ser 2.57 (*)    Total Bilirubin 1.8 (*)    GFR calc non Af Amer 27 (*)    GFR calc Af Amer 32 (*)    All other components within normal limits  CBC WITH DIFFERENTIAL/PLATELET - Abnormal; Notable for the following:    Platelets 123 (*)    All other components within normal limits  CK - Abnormal; Notable for the following:    Total CK 567 (*)    All other components within normal limits    EKG  EKG Interpretation None       Radiology Mr Frmur Right Wo Contrast  Result Date:  02/08/2017 CLINICAL DATA:  Acute onset of severe right thigh pain for 1 day. EXAM: MRI OF THE RIGHT FEMUR WITHOUT CONTRAST TECHNIQUE: Multiplanar, multisequence MR imaging of the right femur was performed. No intravenous contrast was administered. COMPARISON:  Radiographs same date. FINDINGS: Severe edema like signal abnormality and intramuscular hematoma in the markedly enlarged semimembranosus muscle. The proximal semimembranosus tendon is wavy and irregular. Findings most likely due to a musculotendinous junction rupture of the semimembranosus and extensive hemorrhage both in and around the muscle. The semitendinosus and biceps femoris muscles are intact. The quadriceps muscles are intact. No bony abnormality is identified. IMPRESSION: 1. Extensive tear of the semimembranosus along a ruptured musculotendinous junction with extensive hemorrhage, edema and fluid in and around the muscle. The tendon is still intact at its proximal attachment site  on the ischial tuberosity and distally at the tibia. 2. No acute bony findings. Electronically Signed   By: Marijo Sanes M.D.   On: 02/08/2017 18:54   Dg Femur Min 2 Views Right  Result Date: 02/08/2017 CLINICAL DATA:  Pt c/o right femur pain to hamstring. Denies any previous injuries and denies any trauma. EXAM: RIGHT FEMUR 2 VIEWS COMPARISON:  None. FINDINGS: There is no evidence of fracture or other focal bone lesions. Soft tissues are unremarkable. Incidental note made of chronic enthesopathy at the upper pole of the patella. No evidence of joint effusion at the right knee. IMPRESSION: 1. No acute findings. Normal alignment. No degenerative change at the right hip joint. 2. 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 Electronically Signed   By: Franki Cabot M.D.   On: 02/08/2017 16:53    Procedures Procedures (including critical care time)  Medications Ordered in  ED Medications  morphine 4 MG/ML injection 4 mg (4 mg Intravenous Given 02/08/17 1538)  labetalol (NORMODYNE,TRANDATE) injection 10 mg (10 mg Intravenous Given 02/08/17 1902)     Initial Impression / Assessment and Plan / ED Course  I have reviewed the triage vital signs and the nursing notes.  Pertinent labs & imaging results that were available during my care of the patient were reviewed by me and considered in my medical decision making (see chart for details).  Clinical Course as of Feb 09 1546  Fri Feb 08, 2017  1615 Right leg Doppler without DVT. Patient reevaluated, is groggy from morphine physical exam is unchanged. Discussed with Dr. Tomi Bamberger.  CT right thigh considered but wound need contrast and Cr is elevated.  Will order MRI.   [EH]  8588 Spoke with lab regarding CK  [EH]    Clinical Course User Index [EH] Lorin Glass, PA-C   Ian Gregory. presents for evaluation of right posterior thigh firmness, feeling of cramping, and pain. He reports that this is fully atraumatic.  Labs were obtained, Ck mildly elevated.  Patient has not taken any FQs recently.  X-rays grossly unremarkable.  CT considered but based on Cr not a candidate for contrast.  MRI was ordered and obtained  "Extensive tear of the semimembranosus along a ruptured musculotendinous junction with extensive hemorrhage, edema and fluid in and around the muscle. The tendon is still intact at its proximal attachment site on the ischial tuberosity and distally at the tibia." Patient's compartments remained compressible.  Discussion was had with patient about possibility of admission for serial compartment checks, patient refused admission after being informed of the risks of refusing admission including compartment syndrome, chronic pain, disability that may be severe, loss of leg loss of function, or other possible complications. He stated his understanding of the risks of refusing serial compartment checks, stating he  has things to do at home.  He was informed that, even if symptoms worsen in any come back, that it may be too late to intervene and prevent complications.    We'll treat his pain with Norco, crutches, nonweightbearing status. He has been advised to follow-up with orthopedics. He has been given strict return precautions, and states his understanding.  Pt case discussed with Dr. Tomi Bamberger and later Dr. Oleta Mouse who both agreed with my plan.     Final Clinical Impressions(s) / ED Diagnoses   Final diagnoses:  Muscle rupture, nontraumatic  Hematoma    New Prescriptions Discharge Medication List  as of 02/08/2017  7:48 PM    START taking these medications   Details  HYDROcodone-acetaminophen (NORCO/VICODIN) 5-325 MG tablet Take 1 tablet by mouth every 6 (six) hours as needed for severe pain., Starting Fri 02/08/2017, Print         Lorin Glass, PA-C 02/09/17 1554

## 2017-02-08 NOTE — ED Triage Notes (Addendum)
Patient c/o right thigh pain since last night. Patient states it feels like a "bad charlie horse." patient denies any injuries.  Patient did not take his BP med today. BP elevated in triage.

## 2017-02-08 NOTE — Discharge Instructions (Signed)
Please do not put weight on your right leg.    Today you received medications that may make you sleepy or impair your ability to make decisions.  For the next 24 hours please do not drive, operate heavy machinery, care for a small child with out another adult present, or perform any activities that may cause harm to you or someone else if you were to fall asleep or be impaired.   You are being prescribed a medication which may make you sleepy. Please follow up of listed precautions for at least 24 hours after taking one dose.

## 2017-02-08 NOTE — Progress Notes (Signed)
VASCULAR LAB PRELIMINARY  PRELIMINARY  PRELIMINARY  PRELIMINARY  Right lower extremity venous duplex completed.    Preliminary report:  Right:  No evidence of DVT, superficial thrombosis, or Baker's cyst.  Shams Fill, RVS 02/08/2017, 4:04 PM

## 2017-02-08 NOTE — ED Provider Notes (Signed)
Pt presents with acute right thigh pain and swelling.  No known injuries.  On exam, right thigh is enlarged and tender.  No induration.  No erythema.  ?myositis, DVT?, mass ie sarcoma?  No trauma to suggest compartment syndrome.  Will check [place] and labs.  Treat bp and pain.  Consider further imaging if the is non diagnositic.  Medical screening examination/treatment/procedure(s) were conducted as a shared visit with non-physician practitioner(s) and myself.  I personally evaluated the patient during the encounter.     Dorie Rank, MD 02/08/17 [phone]

## 2017-02-08 NOTE — ED Notes (Signed)
IV access attempted x2 without success.

## 2017-02-08 NOTE — ED Notes (Addendum)
Will medicate when he returns.

## 2017-02-17 DIAGNOSIS — M62151 Other rupture of muscle (nontraumatic), right thigh: Secondary | ICD-10-CM | POA: Diagnosis not present

## 2017-02-17 DIAGNOSIS — N183 Chronic kidney disease, stage 3 (moderate): Secondary | ICD-10-CM | POA: Insufficient documentation

## 2017-02-17 DIAGNOSIS — R030 Elevated blood-pressure reading, without diagnosis of hypertension: Secondary | ICD-10-CM | POA: Insufficient documentation

## 2017-02-17 DIAGNOSIS — R58 Hemorrhage, not elsewhere classified: Secondary | ICD-10-CM | POA: Diagnosis not present

## 2017-02-17 DIAGNOSIS — M79651 Pain in right thigh: Secondary | ICD-10-CM | POA: Diagnosis present

## 2017-02-17 LAB — CBC WITH DIFFERENTIAL/PLATELET
BASOS ABS: 0 10*3/uL (ref 0.0–0.1)
BASOS PCT: 1 %
EOS ABS: 0.1 10*3/uL (ref 0.0–0.7)
EOS PCT: 2 %
HCT: 43.7 % (ref 39.0–52.0)
Hemoglobin: 15 g/dL (ref 13.0–17.0)
Lymphocytes Relative: 18 %
Lymphs Abs: 0.9 10*3/uL (ref 0.7–4.0)
MCH: 31.2 pg (ref 26.0–34.0)
MCHC: 34.3 g/dL (ref 30.0–36.0)
MCV: 90.9 fL (ref 78.0–100.0)
MONO ABS: 1 10*3/uL (ref 0.1–1.0)
Monocytes Relative: 19 %
Neutro Abs: 3.3 10*3/uL (ref 1.7–7.7)
Neutrophils Relative %: 60 %
PLATELETS: 213 10*3/uL (ref 150–400)
RBC: 4.81 MIL/uL (ref 4.22–5.81)
RDW: 14.8 % (ref 11.5–15.5)
WBC: 5.3 10*3/uL (ref 4.0–10.5)

## 2017-02-17 LAB — CK: CK TOTAL: 381 U/L (ref 49–397)

## 2017-02-17 LAB — COMPREHENSIVE METABOLIC PANEL
ALK PHOS: 65 U/L (ref 38–126)
ALT: 23 U/L (ref 17–63)
AST: 40 U/L (ref 15–41)
Albumin: 4.6 g/dL (ref 3.5–5.0)
Anion gap: 14 (ref 5–15)
BILIRUBIN TOTAL: 2 mg/dL — AB (ref 0.3–1.2)
BUN: 28 mg/dL — AB (ref 6–20)
CALCIUM: 9.5 mg/dL (ref 8.9–10.3)
CO2: 20 mmol/L — ABNORMAL LOW (ref 22–32)
CREATININE: 1.99 mg/dL — AB (ref 0.61–1.24)
Chloride: 104 mmol/L (ref 101–111)
GFR calc Af Amer: 43 mL/min — ABNORMAL LOW (ref >60)
GFR, EST NON AFRICAN AMERICAN: 37 mL/min — AB (ref >60)
GLUCOSE: 69 mg/dL (ref 65–99)
POTASSIUM: 4.2 mmol/L (ref 3.5–5.1)
Sodium: 138 mmol/L (ref 135–145)
TOTAL PROTEIN: 9.3 g/dL — AB (ref 6.5–8.1)

## 2017-02-17 LAB — PROTIME-INR
INR: 0.91
Prothrombin Time: 12.2 seconds (ref 11.4–15.2)

## 2017-02-17 NOTE — ED Triage Notes (Addendum)
Patient c/o right thigh pain x9 days. States he was seen for same x1 week ago. Reports now there is bruising to posterior thigh. Ambulatory. Patient hypertensive in triage. Reports he did not take his BP meds today.

## 2017-02-17 NOTE — ED Notes (Signed)
MD Nanavati informed of high BP

## 2017-02-18 ENCOUNTER — Emergency Department (HOSPITAL_COMMUNITY)
Admission: EM | Admit: 2017-02-18 | Discharge: 2017-02-18 | Disposition: A | Discharge status: Home / Self Care | Payer: Commercial Managed Care - PPO | Attending: Emergency Medicine | Admitting: Emergency Medicine

## 2017-02-18 DIAGNOSIS — R030 Elevated blood-pressure reading, without diagnosis of hypertension: Secondary | ICD-10-CM

## 2017-02-18 DIAGNOSIS — R58 Hemorrhage, not elsewhere classified: Secondary | ICD-10-CM

## 2017-02-18 DIAGNOSIS — T148XXA Other injury of unspecified body region, initial encounter: Secondary | ICD-10-CM

## 2017-02-18 MED ORDER — AMLODIPINE BESYLATE 5 MG PO TABS
10.0000 mg | ORAL_TABLET | Freq: Once | ORAL | Status: AC
Start: 2017-02-18 — End: 2017-02-18
  Administered 2017-02-18: 10 mg via ORAL
  Filled 2017-02-18: qty 2

## 2017-02-18 MED ORDER — CARVEDILOL 12.5 MG PO TABS
12.5000 mg | ORAL_TABLET | Freq: Two times a day (BID) | ORAL | Status: DC
  Administered 2017-02-18: 12.5 mg via ORAL
  Filled 2017-02-18 (×2): qty 1

## 2017-02-18 MED ORDER — HYDROCODONE-ACETAMINOPHEN 5-325 MG PO TABS
1.0000 | ORAL_TABLET | Freq: Once | ORAL | Status: AC
  Administered 2017-02-18: 1 via ORAL
  Filled 2017-02-18: qty 1

## 2017-02-18 NOTE — ED Notes (Signed)
Patient educated about not driving or performing other critical tasks (such as operating heavy machinery, caring for infant/toddler/child) due to sedative nature of narcotic medications received while in the ED.  Pt/caregiver verbalized understanding.   

## 2017-02-18 NOTE — ED Notes (Signed)
ED Provider at bedside. 

## 2017-02-18 NOTE — ED Provider Notes (Signed)
[facility] DEPT Provider Note   CSN: [id number] Arrival date & time: 02/17/17  [place]     History   Chief Complaint Chief Complaint  Patient presents with  . Leg Pain    HPI Kameran Mcneese. is a 51 y.o. male.  The history is provided by the patient and medical records. No language interpreter was used.  Leg Pain     Elvan Ebron. is a 51 y.o. male  with a PMH of HTN, CKD, sarcoidosis who presents to the Emergency Department complaining of color change to right thigh which occurred this morning. Patient was seen in ED on 9/21 for right thigh pain. At that time, DVT study was negative. An MRI of the area was obtained showing an extensive tear of the semimembranosus along a ruptured musculotendinous junction with extensive hemorrhage, edema and fluid in and around the muscle. The tendon was still intact at insertion point. Per chart review, patient was offered admission for serial compartment checks given extensive nature of the injury to which he declined. He was discharged home with strict return precautions, pain control / symptomatic management. Patient states that he has been taking pain medication with some relief, however pain is not improved - no worsening. At onset of injury, he was unable to ambulate, but states that he can now walk around independently. He denies numbness or tingling to legs. He notes that his main concern today was color change along the thigh which is new.   Blood pressure significantly elevated in ED today. Patient states that he is on 3 BP medications which he has not taken today. He has the medication at home, just has not been taking it. He denies headache, chest pain, shortness of breath, abdominal pain.   Past Medical History:  Diagnosis Date  . CKD (chronic kidney disease) stage 3, GFR 30-59 ml/min   . HTN (hypertension)   . Sarcoidosis     Patient Active Problem List   Diagnosis Date Noted  . Hypertensive crisis without congestive heart  failure   . Chest cold   . Hypertensive emergency   . Thrombocytopenia ([place]) 05/13/2015  . Accelerated hypertension 05/12/2015  . Acute renal failure superimposed on stage 3 chronic kidney disease ([place]) 05/12/2015  . Anemia of chronic renal failure, stage 3 (moderate) ([place]) 05/12/2015  . Acute respiratory failure with hypoxemia ([place]) 05/12/2015  . SIRS (systemic inflammatory response syndrome) ([place]) 05/12/2015  . Lobar pneumonia, unspecified organism ([place]) 05/12/2015  . Pulmonary sarcoidosis/clinical dx only  08/07/2013    No past surgical history on file.     Home Medications    Prior to Admission medications   Medication Sig Start Date End Date Taking? Authorizing Provider  amLODipine (NORVASC) 10 MG tablet Take 1 tablet (10 mg total) by mouth daily. 05/19/15  Yes Robbie Lis, MD  carvedilol (COREG) 6.25 MG tablet Take 12.5 mg by mouth 2 (two) times daily with a meal.   Yes [provider]  HYDROcodone-acetaminophen (NORCO/VICODIN) 5-325 MG tablet Take 1 tablet by mouth every 6 (six) hours as needed for severe pain. 02/08/17  Yes Lorin Glass, PA-C  lisinopril (PRINIVIL,ZESTRIL) 40 MG tablet Take 40 mg by mouth daily.   Yes [provider]  hydrALAZINE (APRESOLINE) 100 MG tablet Take 1 tablet (100 mg total) by mouth every 8 (eight) hours. Patient not taking: Reported on 02/08/2017 05/20/15   Robbie Lis, MD  labetalol (NORMODYNE) 200 MG tablet Take 2 tablets (400 mg total) by  mouth 2 (two) times daily. Patient not taking: Reported on 02/08/2017 05/20/15   Robbie Lis, MD    Family History Family History  Problem Relation Age of Onset  . Hypertension Mother   . Kidney failure Mother   . Diabetes Father   . Hypertension Father     Social History Social History  Substance Use Topics  . Smoking status: Never Smoker  . Smokeless tobacco: Never Used  . Alcohol use Yes     Comment: beer on wkends     Allergies   Patient has no known  allergies.   Review of Systems Review of Systems  Musculoskeletal: Positive for myalgias.  Skin: Positive for color change.  All other systems reviewed and are negative.    Physical Exam Updated Vital Signs BP (!) 175/124 (BP Location: Left Arm)   Pulse 81   Temp 98.4 F (36.9 C) (Oral)   Resp 16   SpO2 99%   Physical Exam  Constitutional: He is oriented to person, place, and time. He appears well-developed and well-nourished. No distress.  HENT:  Head: Normocephalic and atraumatic.  Cardiovascular: Normal rate, regular rhythm and normal heart sounds.   No murmur heard. Pulmonary/Chest: Effort normal and breath sounds normal. No respiratory distress.  Abdominal: Soft. He exhibits no distension. There is no tenderness.  Musculoskeletal:  Right inner thigh with ecchymosis which is tender to palpation. Compartments are soft throughout the RLE. Patient does have full ROM of the right leg and able to ambulate independently with steady gait.  Neurological: He is alert and oriented to person, place, and time.  Skin: Skin is warm and dry.  Nursing note and vitals reviewed.    ED Treatments / Results  Labs (all labs ordered are listed, but only abnormal results are displayed) Labs Reviewed  COMPREHENSIVE METABOLIC PANEL - Abnormal; Notable for the following:       Result Value   CO2 20 (*)    BUN 28 (*)    Creatinine, Ser 1.99 (*)    Total Protein 9.3 (*)    Total Bilirubin 2.0 (*)    GFR calc non Af Amer 37 (*)    GFR calc Af Amer 43 (*)    All other components within normal limits  CBC WITH DIFFERENTIAL/PLATELET  CK  PROTIME-INR    EKG  EKG Interpretation None       Radiology No results found.  Procedures Procedures (including critical care time)  Medications Ordered in ED Medications  carvedilol (COREG) tablet 12.5 mg (12.5 mg Oral Given 02/18/17 0127)  amLODipine (NORVASC) tablet 10 mg (10 mg Oral Given 02/18/17 0101)  HYDROcodone-acetaminophen  (NORCO/VICODIN) 5-325 MG per tablet 1 tablet (1 tablet Oral Given 02/18/17 0101)     Initial Impression / Assessment and Plan / ED Course  I have reviewed the triage vital signs and the nursing notes.  Pertinent labs & imaging results that were available during my care of the patient were reviewed by me and considered in my medical decision making (see chart for details).    Clifford Benninger. is a 51 y.o. male who presents to ED for color change to the right thigh which he noticed today. Patient was seen in ED on 9/21 for right thigh pain. Chart extensively reviewed from this encounter. He had a negative DVT ultrasound then an MRI of the area was obtained showing an extensive tear of his semimembranosus along a ruptured musculotendinous junction with extensive hemorrhage, edema and fluid in and  around the muscle. The tendon is still intact at attachment site. On exam today, all compartments are soft. He is able to ambulate independently with steady gait. He does have full ROM of the RLE as well. No signs of infection. Appears to be healing as expected. He was given referral to orthopedics, however has not scheduled this appointment. I spoke with him about the importance of follow up.  Patient's BP significantly elevated while in ED today. He has hx of HTN on 3 BP medications which he did not take today. He appears to be quite non-compliant with BP medications. BP elevated last ER visit as well when he also did not take medications that day. I spoke with the patient at length about the potential consequences of long-term significantly elevated blood pressure and the importance of taking prescribed BP medications daily as directed. BP appears asymptomatic today. Labs are baseline. Home medications were given and BP improving.   Evaluation does not show pathology that would require ongoing emergent intervention or inpatient treatment. Reasons to return to ER were discussed. PCP follow up for BP check  strongly encouraged. Ortho follow up again stressed. All questions answered.  Patient discussed with Dr. Lita Mains who agrees with treatment plan.   Final Clinical Impressions(s) / ED Diagnoses   Final diagnoses:  Elevated blood pressure reading  Muscle rupture  Ecchymosis    New Prescriptions Discharge Medication List as of 02/18/2017  2:51 AM       Ward, Ozella Almond, PA-C 02/18/17 0320    Julianne Rice, MD 02/18/17 [phone]

## 2017-02-18 NOTE — ED Notes (Signed)
Bed: AE77 Expected date:  Expected time:  Means of arrival:  Comments: Wynetta Emery

## 2017-02-18 NOTE — Discharge Instructions (Signed)
It was my pleasure taking care of you today!   Please schedule an appointment with the orthopedic doctor listed. It is very important that you follow up for further evaluation of your leg injury.   Your blood pressure was extremely elevated today. It is very important that you take your blood pressure medication daily as directed. I recommend that you follow up with your primary care provider in the next week or two for a blood pressure recheck.   Return to ER for new or worsening symptoms, any additional concerns.

## 2017-05-05 ENCOUNTER — Other Ambulatory Visit: Payer: Self-pay

## 2017-05-05 ENCOUNTER — Emergency Department (HOSPITAL_BASED_OUTPATIENT_CLINIC_OR_DEPARTMENT_OTHER): Payer: Commercial Managed Care - PPO

## 2017-05-05 ENCOUNTER — Inpatient Hospital Stay (HOSPITAL_BASED_OUTPATIENT_CLINIC_OR_DEPARTMENT_OTHER)
Admission: EM | Admit: 2017-05-05 | Discharge: 2017-05-08 | DRG: 291 | Disposition: A | Discharge status: Home / Self Care | Payer: Commercial Managed Care - PPO | Attending: Internal Medicine | Admitting: Internal Medicine

## 2017-05-05 ENCOUNTER — Encounter (HOSPITAL_BASED_OUTPATIENT_CLINIC_OR_DEPARTMENT_OTHER): Payer: Self-pay | Admitting: *Deleted

## 2017-05-05 DIAGNOSIS — I5023 Acute on chronic systolic (congestive) heart failure: Secondary | ICD-10-CM

## 2017-05-05 DIAGNOSIS — N183 Chronic kidney disease, stage 3 unspecified: Secondary | ICD-10-CM | POA: Diagnosis present

## 2017-05-05 DIAGNOSIS — I5033 Acute on chronic diastolic (congestive) heart failure: Secondary | ICD-10-CM

## 2017-05-05 DIAGNOSIS — Z8249 Family history of ischemic heart disease and other diseases of the circulatory system: Secondary | ICD-10-CM | POA: Diagnosis not present

## 2017-05-05 DIAGNOSIS — I169 Hypertensive crisis, unspecified: Secondary | ICD-10-CM | POA: Diagnosis present

## 2017-05-05 DIAGNOSIS — I132 Hypertensive heart and chronic kidney disease with heart failure and with stage 5 chronic kidney disease, or end stage renal disease: Secondary | ICD-10-CM | POA: Diagnosis present

## 2017-05-05 DIAGNOSIS — I160 Hypertensive urgency: Secondary | ICD-10-CM | POA: Diagnosis present

## 2017-05-05 DIAGNOSIS — D860 Sarcoidosis of lung: Secondary | ICD-10-CM | POA: Diagnosis present

## 2017-05-05 DIAGNOSIS — J181 Lobar pneumonia, unspecified organism: Secondary | ICD-10-CM

## 2017-05-05 DIAGNOSIS — R0602 Shortness of breath: Secondary | ICD-10-CM | POA: Diagnosis present

## 2017-05-05 DIAGNOSIS — R748 Abnormal levels of other serum enzymes: Secondary | ICD-10-CM | POA: Diagnosis present

## 2017-05-05 DIAGNOSIS — I10 Essential (primary) hypertension: Secondary | ICD-10-CM | POA: Diagnosis not present

## 2017-05-05 DIAGNOSIS — I4510 Unspecified right bundle-branch block: Secondary | ICD-10-CM | POA: Diagnosis present

## 2017-05-05 DIAGNOSIS — I361 Nonrheumatic tricuspid (valve) insufficiency: Secondary | ICD-10-CM | POA: Diagnosis not present

## 2017-05-05 DIAGNOSIS — I5031 Acute diastolic (congestive) heart failure: Secondary | ICD-10-CM | POA: Diagnosis not present

## 2017-05-05 DIAGNOSIS — Z79899 Other long term (current) drug therapy: Secondary | ICD-10-CM

## 2017-05-05 DIAGNOSIS — J189 Pneumonia, unspecified organism: Secondary | ICD-10-CM | POA: Diagnosis present

## 2017-05-05 DIAGNOSIS — I509 Heart failure, unspecified: Secondary | ICD-10-CM | POA: Diagnosis not present

## 2017-05-05 DIAGNOSIS — I5043 Acute on chronic combined systolic (congestive) and diastolic (congestive) heart failure: Secondary | ICD-10-CM | POA: Diagnosis present

## 2017-05-05 DIAGNOSIS — D869 Sarcoidosis, unspecified: Secondary | ICD-10-CM | POA: Diagnosis not present

## 2017-05-05 DIAGNOSIS — J99 Respiratory disorders in diseases classified elsewhere: Secondary | ICD-10-CM | POA: Diagnosis not present

## 2017-05-05 LAB — CBC
HEMATOCRIT: 45.5 % (ref 39.0–52.0)
HEMOGLOBIN: 15.2 g/dL (ref 13.0–17.0)
MCH: 29.9 pg (ref 26.0–34.0)
MCHC: 33.4 g/dL (ref 30.0–36.0)
MCV: 89.4 fL (ref 78.0–100.0)
PLATELETS: 160 10*3/uL (ref 150–400)
RBC: 5.09 MIL/uL (ref 4.22–5.81)
RDW: 13.8 % (ref 11.5–15.5)
WBC: 4.5 10*3/uL (ref 4.0–10.5)

## 2017-05-05 LAB — BASIC METABOLIC PANEL
ANION GAP: 9 (ref 5–15)
BUN: 36 mg/dL — ABNORMAL HIGH (ref 6–20)
CHLORIDE: 106 mmol/L (ref 101–111)
CO2: 22 mmol/L (ref 22–32)
Calcium: 9.2 mg/dL (ref 8.9–10.3)
Creatinine, Ser: 2.44 mg/dL — ABNORMAL HIGH (ref 0.61–1.24)
GFR calc non Af Amer: 29 mL/min — ABNORMAL LOW (ref >60)
GFR, EST AFRICAN AMERICAN: 34 mL/min — AB (ref >60)
Glucose, Bld: 103 mg/dL — ABNORMAL HIGH (ref 65–99)
Potassium: 3.6 mmol/L (ref 3.5–5.1)
Sodium: 137 mmol/L (ref 135–145)

## 2017-05-05 LAB — BRAIN NATRIURETIC PEPTIDE: B NATRIURETIC PEPTIDE 5: 1572.6 pg/mL — AB (ref 0.0–100.0)

## 2017-05-05 LAB — TROPONIN I: Troponin I: 0.09 ng/mL (ref <0.03)

## 2017-05-05 MED ORDER — ONDANSETRON 4 MG PO TBDP
4.0000 mg | ORAL_TABLET | Freq: Once | ORAL | Status: AC
  Administered 2017-05-05: 4 mg via ORAL
  Filled 2017-05-05: qty 1

## 2017-05-05 MED ORDER — FUROSEMIDE 10 MG/ML IJ SOLN
40.0000 mg | Freq: Two times a day (BID) | INTRAMUSCULAR | Status: DC
  Administered 2017-05-05 – 2017-05-08 (×6): 40 mg via INTRAVENOUS
  Filled 2017-05-05 (×6): qty 4

## 2017-05-05 MED ORDER — HYDROCODONE-ACETAMINOPHEN 5-325 MG PO TABS: 1.0000 | ORAL_TABLET | Freq: Four times a day (QID) | ORAL | Status: DC | PRN

## 2017-05-05 MED ORDER — ALPRAZOLAM 0.25 MG PO TABS: 0.2500 mg | ORAL_TABLET | Freq: Two times a day (BID) | ORAL | Status: DC | PRN

## 2017-05-05 MED ORDER — ALBUTEROL SULFATE (2.5 MG/3ML) 0.083% IN NEBU
2.5000 mg | INHALATION_SOLUTION | RESPIRATORY_TRACT | Status: DC | PRN
Start: 2017-05-05 — End: 2017-05-08

## 2017-05-05 MED ORDER — ONDANSETRON HCL 4 MG/2ML IJ SOLN: 4.0000 mg | Freq: Four times a day (QID) | INTRAMUSCULAR | Status: DC | PRN

## 2017-05-05 MED ORDER — ACETAMINOPHEN 500 MG PO TABS
1000.0000 mg | ORAL_TABLET | Freq: Once | ORAL | Status: AC
  Administered 2017-05-05: 1000 mg via ORAL
  Filled 2017-05-05: qty 2

## 2017-05-05 MED ORDER — DEXTROSE 5 % IV SOLN
1.0000 g | INTRAVENOUS | Status: DC
  Administered 2017-05-06: 1 g via INTRAVENOUS
  Filled 2017-05-05: qty 10

## 2017-05-05 MED ORDER — HYDRALAZINE HCL 50 MG PO TABS
100.0000 mg | ORAL_TABLET | Freq: Three times a day (TID) | ORAL | Status: DC
  Administered 2017-05-05 – 2017-05-08 (×9): 100 mg via ORAL
  Filled 2017-05-05 (×9): qty 2

## 2017-05-05 MED ORDER — HYDRALAZINE HCL 25 MG PO TABS
100.0000 mg | ORAL_TABLET | Freq: Once | ORAL | Status: AC
  Administered 2017-05-05: 100 mg via ORAL
  Filled 2017-05-05: qty 4

## 2017-05-05 MED ORDER — SODIUM CHLORIDE 0.9 % IV SOLN: 250.0000 mL | INTRAVENOUS | Status: DC | PRN

## 2017-05-05 MED ORDER — IPRATROPIUM-ALBUTEROL 0.5-2.5 (3) MG/3ML IN SOLN
3.0000 mL | Freq: Once | RESPIRATORY_TRACT | Status: AC
Start: 2017-05-05 — End: 2017-05-05
  Administered 2017-05-05: 3 mL via RESPIRATORY_TRACT
  Filled 2017-05-05: qty 3

## 2017-05-05 MED ORDER — AMLODIPINE BESYLATE 5 MG PO TABS
10.0000 mg | ORAL_TABLET | Freq: Once | ORAL | Status: AC
  Administered 2017-05-05: 10 mg via ORAL
  Filled 2017-05-05: qty 2

## 2017-05-05 MED ORDER — CARVEDILOL 12.5 MG PO TABS
12.5000 mg | ORAL_TABLET | Freq: Two times a day (BID) | ORAL | Status: DC
  Administered 2017-05-05 – 2017-05-07 (×4): 12.5 mg via ORAL
  Filled 2017-05-05 (×4): qty 1

## 2017-05-05 MED ORDER — SODIUM CHLORIDE 0.9% FLUSH
3.0000 mL | Freq: Two times a day (BID) | INTRAVENOUS | Status: DC
  Administered 2017-05-05 – 2017-05-08 (×6): 3 mL via INTRAVENOUS

## 2017-05-05 MED ORDER — LABETALOL HCL 5 MG/ML IV SOLN: 10.0000 mg | INTRAVENOUS | Status: DC | PRN

## 2017-05-05 MED ORDER — HEPARIN SODIUM (PORCINE) 5000 UNIT/ML IJ SOLN
5000.0000 [IU] | Freq: Three times a day (TID) | INTRAMUSCULAR | Status: DC
  Administered 2017-05-05 – 2017-05-08 (×9): 5000 [IU] via SUBCUTANEOUS
  Filled 2017-05-05 (×9): qty 1

## 2017-05-05 MED ORDER — SODIUM CHLORIDE 0.9% FLUSH: 3.0000 mL | INTRAVENOUS | Status: DC | PRN

## 2017-05-05 MED ORDER — DEXTROSE 5 % IV SOLN
500.0000 mg | Freq: Once | INTRAVENOUS | Status: AC
  Administered 2017-05-05: 500 mg via INTRAVENOUS
  Filled 2017-05-05: qty 500

## 2017-05-05 MED ORDER — METHYLPREDNISOLONE SODIUM SUCC 40 MG IJ SOLR
40.0000 mg | Freq: Three times a day (TID) | INTRAMUSCULAR | Status: DC
  Administered 2017-05-05: 40 mg via INTRAVENOUS
  Filled 2017-05-05: qty 1

## 2017-05-05 MED ORDER — SODIUM CHLORIDE 0.9 % IV BOLUS (SEPSIS)
500.0000 mL | Freq: Once | INTRAVENOUS | Status: AC
  Administered 2017-05-05: 500 mL via INTRAVENOUS

## 2017-05-05 MED ORDER — ACETAMINOPHEN 325 MG PO TABS
650.0000 mg | ORAL_TABLET | ORAL | Status: DC | PRN
  Administered 2017-05-07 – 2017-05-08 (×2): 650 mg via ORAL
  Filled 2017-05-05 (×2): qty 2

## 2017-05-05 MED ORDER — DEXTROSE 5 % IV SOLN
1.0000 g | Freq: Once | INTRAVENOUS | Status: AC
  Administered 2017-05-05: 1 g via INTRAVENOUS
  Filled 2017-05-05: qty 10

## 2017-05-05 MED ORDER — AMLODIPINE BESYLATE 10 MG PO TABS
10.0000 mg | ORAL_TABLET | Freq: Every day | ORAL | Status: DC
  Administered 2017-05-06 – 2017-05-08 (×3): 10 mg via ORAL
  Filled 2017-05-05 (×3): qty 1

## 2017-05-05 MED ORDER — AZITHROMYCIN 500 MG IV SOLR
INTRAVENOUS | Status: AC
  Administered 2017-05-05: 22:00:00
  Filled 2017-05-05: qty 500

## 2017-05-05 MED ORDER — NITROGLYCERIN IN D5W 200-5 MCG/ML-% IV SOLN
0.0000 ug/min | Freq: Once | INTRAVENOUS | Status: AC
  Administered 2017-05-05: 10 ug/min via INTRAVENOUS
  Filled 2017-05-05: qty 250

## 2017-05-05 MED ORDER — DEXTROSE 5 % IV SOLN
500.0000 mg | INTRAVENOUS | Status: DC
  Administered 2017-05-06: 500 mg via INTRAVENOUS
  Filled 2017-05-05: qty 500

## 2017-05-05 MED ORDER — LABETALOL HCL 200 MG PO TABS
200.0000 mg | ORAL_TABLET | Freq: Once | ORAL | Status: DC
  Filled 2017-05-05: qty 1

## 2017-05-05 NOTE — ED Notes (Signed)
Pt C/O abd pain and nausea. EDP advised.

## 2017-05-05 NOTE — ED Notes (Signed)
Troponin 0.09, given to ED provider

## 2017-05-05 NOTE — ED Provider Notes (Signed)
[facility] Provider Note   CSN: [id number] Arrival date & time: 05/05/17  1618     History   Chief Complaint Chief Complaint  Patient presents with  . Shortness of Breath    HPI Ian Cast. is a 51 y.o. male.  [facility] is a 51 year old male with history of sarcoidosis presenting with 1 week of worsening shortness of breath with exertion cough productive with white sputum.  Reports that he has been feeling weak over the past couple of days and noticing that he is losing his breath easier with minimal exertion.  He denies any chest pain, but does endorse worsening lower extremity edema.  Has been noticing swelling up to at least his thighs.  He denies any fevers, chills, nausea, vomiting or diarrhea.  Does have a history significant for sarcoidosis and hypertension.  He has not taken his blood pressure medication in several months.  He denies any headache, changes in vision, dizziness, lightheadedness does endorse some weakness.       Past Medical History:  Diagnosis Date  . CKD (chronic kidney disease) stage 3, GFR 30-59 ml/min ([facility])   . HTN (hypertension)   . Sarcoidosis     Patient Active Problem List   Diagnosis Date Noted  . CAP (community acquired pneumonia) 05/05/2017  . Hypertensive crisis without congestive heart failure   . Chest cold   . Hypertensive emergency   . Thrombocytopenia ([place]) 05/13/2015  . Accelerated hypertension 05/12/2015  . Acute renal failure superimposed on stage 3 chronic kidney disease ([place]) 05/12/2015  . Anemia of chronic renal failure, stage 3 (moderate) ([place]) 05/12/2015  . Acute respiratory failure with hypoxemia ([place]) 05/12/2015  . SIRS (systemic inflammatory response syndrome) ([place]) 05/12/2015  . Lobar pneumonia, unspecified organism ([place]) 05/12/2015  . Pulmonary sarcoidosis/clinical dx only  08/07/2013    History reviewed. No pertinent surgical history.     Home Medications    Prior to  Admission medications   Medication Sig Start Date End Date Taking? Authorizing Provider  amLODipine (NORVASC) 10 MG tablet Take 1 tablet (10 mg total) by mouth daily. 05/19/15  Yes Robbie Lis, MD  carvedilol (COREG) 6.25 MG tablet Take 12.5 mg by mouth 2 (two) times daily with a meal.   Yes [provider]  hydrALAZINE (APRESOLINE) 100 MG tablet Take 1 tablet (100 mg total) by mouth every 8 (eight) hours. 05/20/15  Yes Robbie Lis, MD  HYDROcodone-acetaminophen (NORCO/VICODIN) 5-325 MG tablet Take 1 tablet by mouth every 6 (six) hours as needed for severe pain. 02/08/17  Yes Lorin Glass, PA-C  labetalol (NORMODYNE) 200 MG tablet Take 2 tablets (400 mg total) by mouth 2 (two) times daily. 05/20/15  Yes Robbie Lis, MD  lisinopril (PRINIVIL,ZESTRIL) 40 MG tablet Take 40 mg by mouth daily.   Yes [provider]    Family History Family History  Problem Relation Age of Onset  . Hypertension Mother   . Kidney failure Mother   . Diabetes Father   . Hypertension Father     Social History Social History   Tobacco Use  . Smoking status: Never Smoker  . Smokeless tobacco: Never Used  Substance Use Topics  . Alcohol use: Yes    Comment: beer on wkends  . Drug use: No     Allergies   Patient has no known allergies.   Review of Systems Review of Systems  Constitutional: Positive for activity change and fatigue. Negative for chills  and fever.  HENT: Positive for congestion. Negative for sore throat.   Eyes: Negative.   Respiratory: Positive for cough and shortness of breath. Negative for wheezing.   Cardiovascular: Positive for leg swelling. Negative for chest pain and palpitations.  Gastrointestinal: Negative.   Endocrine: Negative.   Genitourinary: Negative.   Musculoskeletal: Negative.   Neurological: Negative.      Physical Exam Updated Vital Signs BP (!) 153/107   Pulse (!) 113   Temp 98.8 F (37.1 C) (Oral)   Resp (!) 30   Ht 6\' 3"   (1.905 m)   Wt 96.2 kg (212 lb)   SpO2 93%   BMI 26.50 kg/m   Physical Exam  Constitutional: He appears well-developed and well-nourished.  HENT:  Head: Normocephalic and atraumatic.  Eyes: Conjunctivae are normal.  Neck: Neck supple.  Cardiovascular: Normal rate and regular rhythm.  No murmur heard. Pulmonary/Chest: Effort normal and breath sounds normal. Tachypnea noted. No respiratory distress. He has no decreased breath sounds. He has no wheezes. He has no rhonchi. He has no rales.  Abdominal: Soft. There is no tenderness.  Musculoskeletal:       Right lower leg: He exhibits edema.       Left lower leg: He exhibits edema.  Neurological: He is alert.  Skin: Skin is warm and dry. Capillary refill takes less than 2 seconds.  Psychiatric: He has a normal mood and affect.  Nursing note and vitals reviewed.    ED Treatments / Results  Labs (all labs ordered are listed, but only abnormal results are displayed) Labs Reviewed  BASIC METABOLIC PANEL - Abnormal; Notable for the following components:      Result Value   Glucose, Bld 103 (*)    BUN 36 (*)    Creatinine, Ser 2.44 (*)    GFR calc non Af Amer 29 (*)    GFR calc Af Amer 34 (*)    All other components within normal limits  TROPONIN I - Abnormal; Notable for the following components:   Troponin I 0.09 (*)    All other components within normal limits  BRAIN NATRIURETIC PEPTIDE - Abnormal; Notable for the following components:   B Natriuretic Peptide 1,572.6 (*)    All other components within normal limits  CBC    EKG  EKG Interpretation  Date/Time:  Sunday May 05 2017 16:29:14 EST Ventricular Rate:  115 PR Interval:    QRS Duration: 149 QT Interval:  365 QTC Calculation: 505 R Axis:   94 Text Interpretation:  Sinus tachycardia Probable left atrial enlargement Right bundle branch block tachycardia noted no acute sichemia noted.  Confirmed by Zenovia Jarred [phone]) on 05/05/2017 5:33:48 PM        Radiology Dg Chest 2 View  Result Date: 05/05/2017 CLINICAL DATA:  Cough and dyspnea x1 week.  Sarcoid. EXAM: CHEST  2 VIEW COMPARISON:  05/15/2015 FINDINGS: Cardiomegaly. No aortic aneurysm Bibasilar atelectasis with superimposed pneumonia in the right lower lobe. There is mild pulmonary vascular congestion and minimal pleural thickening or trace effusion along the periphery of the right lung base. No acute osseous abnormality. IMPRESSION: 1. Stable cardiomegaly. 2. Right lower lobe pulmonary consolidation consistent with pneumonia. 3. Mild pulmonary vascular congestion with probable trace right effusion or pleural thickening at the right lung base. Electronically Signed   By: Ashley Royalty M.D.   On: 05/05/2017 16:58    Procedures Procedures (including critical care time)  Medications Ordered in ED Medications  azithromycin (ZITHROMAX) 500 MG injection (  not administered)  sodium chloride 0.9 % bolus 500 mL (0 mLs Intravenous Stopped 05/05/17 1806)  hydrALAZINE (APRESOLINE) tablet 100 mg (100 mg Oral Given 05/05/17 1731)  amLODipine (NORVASC) tablet 10 mg (10 mg Oral Given 05/05/17 1740)  ipratropium-albuterol (DUONEB) 0.5-2.5 (3) MG/3ML nebulizer solution 3 mL (3 mLs Nebulization Given 05/05/17 1801)  nitroGLYCERIN 50 mg in dextrose 5 % 250 mL (0.2 mg/mL) infusion (0 mcg/min Intravenous Transfusing/Transfer 05/05/17 2115)  acetaminophen (TYLENOL) tablet 1,000 mg (1,000 mg Oral Given 05/05/17 1823)  cefTRIAXone (ROCEPHIN) 1 g in dextrose 5 % 50 mL IVPB (0 g Intravenous Stopped 05/05/17 1918)  azithromycin (ZITHROMAX) 500 mg in dextrose 5 % 250 mL IVPB (0 mg Intravenous Stopped 05/05/17 2115)  ondansetron (ZOFRAN-ODT) disintegrating tablet 4 mg (4 mg Oral Given 05/05/17 1842)     Initial Impression / Assessment and Plan / ED Course  I have reviewed the triage vital signs and the nursing notes.  Pertinent labs & imaging results that were available during my care of the patient were  reviewed by me and considered in my medical decision making (see chart for details).    51 year old male presenting with shortness of breath and worsening lower extremity edema to the hips bilaterally noted to have right lower lobe consolidation and BNP elevated to greater than 1500.  Patient has no known history of heart failure, however does have a history of sarcoidosis and poorly controlled hypertension.  Additionally with hypertensive emergency with troponin elevated to 0.09.  EKG showing no acute changes.  Initially patient was started on 2 L nasal cannula for desaturations to 90%.  he denies any chest pain, changes in vision or headache.  Does have lower extremity edema to the hips bilaterally.  Started on ceftriaxone and azithromycin for community-acquired pneumonia.  Hospitalist team contacted for admission for heart failure exacerbation.   Final Clinical Impressions(s) / ED Diagnoses   Final diagnoses:  Community acquired pneumonia of right lower lobe of lung ([place])  Acute heart failure, unspecified heart failure type [facility])    ED Discharge Orders    None       Eloise Levels, MD 05/05/17 2136    Macarthur Critchley, MD 05/07/17 1038

## 2017-05-05 NOTE — H&P (Signed)
History and Physical    Ian Gregory. FWY:[id number] DOB: 01/23/1966 DOA: 05/05/2017  PCP: [name], FNP   Patient coming from: Home, by way of [facility]    Chief Complaint: SOB, leg swelling   HPI: Ian Gregory. is a 51 y.o. male with medical history significant for chronic kidney disease stage III, hypertension, and possible sarcoidosis, now presenting to the emergency department for evaluation of progressive dyspnea and leg swelling.  Patient reports that he has had bilateral leg edema for more than a month now but was otherwise well until approximately 1 week ago when he began to notice dyspnea with minimal exertion.  Over the past week, shortness of breath has progressively worsened.  He denies any fevers or chills and denies any significant cough.  There has not been any chest pain associated with this.  Patient reports that he had not taken his blood pressure medicine in "a couple months."  [place] ED Course: Upon arrival to the ED, patient is found to be afebrile, saturating low 90s on room air, tachypneic, tachycardic, and hypertensive to 240/185.  EKG features a sinus tachycardia with rate 115 and right bundle branch block.  Chest x-ray features a right lower lobe consolidation concerning for pneumonia, as well as mild pulmonary vascular congestion.  Chemistry panel reveals a BUN of 36 and creatinine of 2.44, similar to priors in the 2-2.5 range.  CBC is unremarkable, troponin is elevated to 0.09, patient was treated with 500 cc normal saline, Rocephin, Norvasc, hydralazine, and nitroglycerin in the emergency department.  Blood pressure improved and remained stable.  Admission to [facility] stepdown unit was arranged for ongoing evaluation and management of possible pneumonia and hypertensive crisis with acute CHF.  Review of Systems:  All other systems reviewed and apart from HPI, are negative.  Past Medical History:  Diagnosis Date  . CKD  (chronic kidney disease) stage 3, GFR 30-59 ml/min (HCC)   . HTN (hypertension)   . Sarcoidosis     History reviewed. No pertinent surgical history.   reports that  has never smoked. he has never used smokeless tobacco. He reports that he drinks alcohol. He reports that he does not use drugs.  No Known Allergies  Family History  Problem Relation Age of Onset  . Hypertension Mother   . Kidney failure Mother   . Diabetes Father   . Hypertension Father      Prior to Admission medications   Medication Sig Start Date End Date Taking? Authorizing Provider  amLODipine (NORVASC) 10 MG tablet Take 1 tablet (10 mg total) by mouth daily. 05/19/15  Yes Robbie Lis, MD  carvedilol (COREG) 6.25 MG tablet Take 12.5 mg by mouth 2 (two) times daily with a meal.   Yes [provider]  hydrALAZINE (APRESOLINE) 100 MG tablet Take 1 tablet (100 mg total) by mouth every 8 (eight) hours. 05/20/15  Yes Robbie Lis, MD  HYDROcodone-acetaminophen (NORCO/VICODIN) 5-325 MG tablet Take 1 tablet by mouth every 6 (six) hours as needed for severe pain. 02/08/17  Yes Lorin Glass, PA-C  labetalol (NORMODYNE) 200 MG tablet Take 2 tablets (400 mg total) by mouth 2 (two) times daily. 05/20/15  Yes Robbie Lis, MD  lisinopril (PRINIVIL,ZESTRIL) 40 MG tablet Take 40 mg by mouth daily.   Yes [provider]    Physical Exam: Vitals:   05/05/17 2000 05/05/17 2100 05/05/17 2227 05/05/17 2229  BP: (!) 176/122 (!) 153/107  Marland Kitchen)  149/111  Pulse: (!) 118 (!) 113 (!) 105 (!) 104  Resp: 19 (!) 30 (!) 23 (!) 25  Temp:   99.1 F (37.3 C) 99.1 F (37.3 C)  TempSrc:   Oral Oral  SpO2: 94% 93% 95% 95%  Weight:   100.5 kg (221 lb 9 oz)   Height:   6\' 3"  (1.905 m)       Constitutional: NAD, calm, comfortable Eyes: PERTLA, lids and conjunctivae normal ENMT: Mucous membranes are moist. Posterior pharynx clear of any exudate or lesions.   Neck: normal, supple, no masses, no  thyromegaly Respiratory: Mild dyspnea with speech, bibasilar rales. No accessory muscle use.  Cardiovascular: Rate ~110 and regular. 2+ pretibial pitting edema bilaterally.  Abdomen: No distension, no tenderness, no masses palpated. Bowel sounds normal.  Musculoskeletal: no clubbing / cyanosis. No joint deformity upper and lower extremities.   Skin: no significant rashes, lesions, ulcers. Warm, dry, well-perfused. Neurologic: CN 2-12 grossly intact. Sensation intact. Strength 5/5 in all 4 limbs.  Psychiatric: Alert and oriented x 3. Calm, cooperative.     Labs on Admission: I have personally reviewed following labs and imaging studies  CBC: Recent Labs  Lab 05/05/17 1640  WBC 4.5  HGB 15.2  HCT 45.5  MCV 89.4  PLT 626   Basic Metabolic Panel: Recent Labs  Lab 05/05/17 1640  NA 137  K 3.6  CL 106  CO2 22  GLUCOSE 103*  BUN 36*  CREATININE 2.44*  CALCIUM 9.2   GFR: Estimated Creatinine Clearance: 42.8 mL/min (A) (by C-G formula based on SCr of 2.44 mg/dL (H)). Liver Function Tests: No results for input(s): AST, ALT, ALKPHOS, BILITOT, PROT, ALBUMIN in the last 168 hours. No results for input(s): LIPASE, AMYLASE in the last 168 hours. No results for input(s): AMMONIA in the last 168 hours. Coagulation Profile: No results for input(s): INR, PROTIME in the last 168 hours. Cardiac Enzymes: Recent Labs  Lab 05/05/17 1640  TROPONINI 0.09*   BNP (last 3 results) No results for input(s): PROBNP in the last 8760 hours. HbA1C: No results for input(s): HGBA1C in the last 72 hours. CBG: No results for input(s): GLUCAP in the last 168 hours. Lipid Profile: No results for input(s): CHOL, HDL, LDLCALC, TRIG, CHOLHDL, LDLDIRECT in the last 72 hours. Thyroid Function Tests: No results for input(s): TSH, T4TOTAL, FREET4, T3FREE, THYROIDAB in the last 72 hours. Anemia Panel: No results for input(s): VITAMINB12, FOLATE, FERRITIN, TIBC, IRON, RETICCTPCT in the last 72  hours. Urine analysis:    Component Value Date/Time   COLORURINE YELLOW 06/27/2013 0839   APPEARANCEUR CLEAR 06/27/2013 0839   LABSPEC 1.011 06/27/2013 0839   PHURINE 6.5 06/27/2013 0839   GLUCOSEU NEGATIVE 06/27/2013 0839   HGBUR NEGATIVE 06/27/2013 0839   BILIRUBINUR NEGATIVE 06/27/2013 0839   KETONESUR NEGATIVE 06/27/2013 0839   PROTEINUR 30 (A) 06/27/2013 0839   UROBILINOGEN 0.2 06/27/2013 0839   NITRITE NEGATIVE 06/27/2013 0839   LEUKOCYTESUR NEGATIVE 06/27/2013 0839   Sepsis Labs: @LABRCNTIP (procalcitonin:4,lacticidven:4) )No results found for this or any previous visit (from the past 240 hour(s)).   Radiological Exams on Admission: Dg Chest 2 View  Result Date: 05/05/2017 CLINICAL DATA:  Cough and dyspnea x1 week.  Sarcoid. EXAM: CHEST  2 VIEW COMPARISON:  05/15/2015 FINDINGS: Cardiomegaly. No aortic aneurysm Bibasilar atelectasis with superimposed pneumonia in the right lower lobe. There is mild pulmonary vascular congestion and minimal pleural thickening or trace effusion along the periphery of the right lung base. No acute osseous abnormality.  IMPRESSION: 1. Stable cardiomegaly. 2. Right lower lobe pulmonary consolidation consistent with pneumonia. 3. Mild pulmonary vascular congestion with probable trace right effusion or pleural thickening at the right lung base. Electronically Signed   By: Ashley Royalty M.D.   On: 05/05/2017 16:58    EKG: Independently reviewed. Sinus tachycardia (rate 115), RBBB.   Assessment/Plan  1. Acute CHF  - Pt presents with dyspnea and bilateral leg swelling, noted to be hypertensive and tachypneic with marked elevation in BNP  - Echo from two years ago with moderate LVH and mild tricuspid regurgitation, but normal EF and diastolic dysfunction  - Possibly secondary to #3  - Plan to continue cardiac monitoring, diurese with Lasix 40 mg IV q12h, SLIV, fluid-restrict diet, follow daily wts and I/O's, update echo, continue beta-blocker, hold ACE  given elevated creatinine   2. CAP  - RLL consolidation noted on CXR, possibly reflecting PNA   - No fever or leukocytosis, denies cough  - Treated in ED with Rocephin and azithromycin  - Check sputum culture and continue current abx  3. Hypertension with hypertensive urgency  - BP markedly elevated in ED, improved with hydralazine, Norvasc, and NTG in ED  - Pt reports not taking his antihypertensives in recent months  - Resume Novasc, hydralazine, and Coreg; hold lisinopril given elevated creatinine    4. CKD stage III-IV  - SCr is 2.44 on admission, similar to recent priors  - Renally-dose medications, follow daily chem panel during diuresis    5. Elevated troponin  - No anginal complaints, likely secondary to #1  - Continue cardiac monitoring, trend troponin, echo ordered as above, continue beta-blocker     DVT prophylaxis: sq heparin  Code Status: Full  Family Communication: Discussed with patient Disposition Plan: Admit to SDU Consults called: None Admission status: Inpatient    Vianne Bulls, MD [facility] Pager [phone]  If 7PM-7AM, please contact night-coverage [email or web address] Password [email or web address]  05/05/2017, 10:59 PM

## 2017-05-05 NOTE — ED Triage Notes (Signed)
SHOB x 1 week. Worse with exertion. Diminished BBS. ?murmur ausc. Skin W&D. Denies pain or other s/s. Placed on monitor showing ST with BBB. IV, EKG being done. Placed on O2 2L/[place] d/t sats dropping into low 90's.

## 2017-05-05 NOTE — ED Notes (Signed)
ED Provider at bedside. 

## 2017-05-05 NOTE — ED Triage Notes (Signed)
Called to triage for pt c/o SOB. Portable pulse reads HR128, Sats 91%. Taken to room 9 for triage

## 2017-05-05 NOTE — ED Notes (Signed)
Pt on cardiac monitor and auto VS 

## 2017-05-06 ENCOUNTER — Inpatient Hospital Stay (HOSPITAL_COMMUNITY): Payer: Commercial Managed Care - PPO

## 2017-05-06 DIAGNOSIS — D860 Sarcoidosis of lung: Secondary | ICD-10-CM

## 2017-05-06 DIAGNOSIS — I361 Nonrheumatic tricuspid (valve) insufficiency: Secondary | ICD-10-CM

## 2017-05-06 DIAGNOSIS — I5031 Acute diastolic (congestive) heart failure: Secondary | ICD-10-CM

## 2017-05-06 LAB — CBC WITH DIFFERENTIAL/PLATELET
BASOS PCT: 0 %
Basophils Absolute: 0 10*3/uL (ref 0.0–0.1)
EOS ABS: 0 10*3/uL (ref 0.0–0.7)
Eosinophils Relative: 0 %
HCT: 38.6 % — ABNORMAL LOW (ref 39.0–52.0)
HEMOGLOBIN: 12.5 g/dL — AB (ref 13.0–17.0)
LYMPHS ABS: 0.4 10*3/uL — AB (ref 0.7–4.0)
Lymphocytes Relative: 8 %
MCH: 29.1 pg (ref 26.0–34.0)
MCHC: 32.4 g/dL (ref 30.0–36.0)
MCV: 90 fL (ref 78.0–100.0)
MONO ABS: 0.2 10*3/uL (ref 0.1–1.0)
MONOS PCT: 4 %
NEUTROS PCT: 88 %
Neutro Abs: 3.9 10*3/uL (ref 1.7–7.7)
Platelets: 147 10*3/uL — ABNORMAL LOW (ref 150–400)
RBC: 4.29 MIL/uL (ref 4.22–5.81)
RDW: 13.8 % (ref 11.5–15.5)
WBC: 4.4 10*3/uL (ref 4.0–10.5)

## 2017-05-06 LAB — BASIC METABOLIC PANEL
ANION GAP: 10 (ref 5–15)
BUN: 29 mg/dL — AB (ref 6–20)
CO2: 22 mmol/L (ref 22–32)
CREATININE: 2.4 mg/dL — AB (ref 0.61–1.24)
Calcium: 8.5 mg/dL — ABNORMAL LOW (ref 8.9–10.3)
Chloride: 104 mmol/L (ref 101–111)
GFR calc Af Amer: 34 mL/min — ABNORMAL LOW (ref >60)
GFR, EST NON AFRICAN AMERICAN: 30 mL/min — AB (ref >60)
GLUCOSE: 121 mg/dL — AB (ref 65–99)
Potassium: 4.3 mmol/L (ref 3.5–5.1)
Sodium: 136 mmol/L (ref 135–145)

## 2017-05-06 LAB — HIV ANTIBODY (ROUTINE TESTING W REFLEX): HIV Screen 4th Generation wRfx: NONREACTIVE

## 2017-05-06 LAB — ECHOCARDIOGRAM COMPLETE
Height: 75 in
Weight: 3513.25 oz

## 2017-05-06 LAB — TROPONIN I
TROPONIN I: 0.06 ng/mL — AB (ref <0.03)
TROPONIN I: 0.05 ng/mL — AB (ref <0.03)
Troponin I: 0.06 ng/mL (ref <0.03)

## 2017-05-06 LAB — MRSA PCR SCREENING: MRSA BY PCR: INVALID — AB

## 2017-05-06 LAB — STREP PNEUMONIAE URINARY ANTIGEN: Strep Pneumo Urinary Antigen: NEGATIVE

## 2017-05-06 MED ORDER — NITROGLYCERIN IN D5W 200-5 MCG/ML-% IV SOLN: 0.0000 ug/min | INTRAVENOUS | Status: DC

## 2017-05-06 MED ORDER — NITROGLYCERIN IN D5W 200-5 MCG/ML-% IV SOLN: 0.0000 ug/min | Freq: Once | INTRAVENOUS | Status: AC

## 2017-05-06 NOTE — Progress Notes (Signed)
  Echocardiogram 2D Echocardiogram has been performed.  Bobbye Charleston 05/06/2017, 3:16 PM

## 2017-05-06 NOTE — Progress Notes (Signed)
PROGRESS NOTE    Ian Gregory.  CBJ:[id number] DOB: 1966/04/02 DOA: 05/05/2017 PCP: [name], FNP   Brief Narrative: Ian Gregory. is a 51 y.o. male with medical history significant for chronic kidney disease stage III, hypertension, and possible sarcoidosis. Patient here with hypertensive urgency and CHF exacerbation. Diuresing and on antibiotics.   Assessment & Plan:   Principal Problem:   CAP (community acquired pneumonia) Active Problems:   Pulmonary sarcoidosis/clinical dx only    Accelerated hypertension   Hypertensive crisis   CKD (chronic kidney disease) stage 3, GFR 30-59 ml/min (HCC)   Acute CHF (congestive heart failure) ([place])   Community acquired pneumonia -continue ceftriaxone and azithromycin -wean to room air -MRSA screen sent for culture  Acute on chronic diastolic heart failure Echocardiogram significant for an EF of 55-60% with grade 1 diastolic dysfunction. -wean oxygen to room air -continue Lasix -continue Coreg  Hypertensive urgency Patient started on nitro drip and home medications -titrate off drip -Continue Norvasc, Coreg and hydralazine -Holding Lisinopril secondary to diuresis  CKD stage III Stable.  Elevated troponin No chest pain. Echo without wall motion abnormalities or dysfunction. 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 There was severe   concentric hypertrophy. Systolic function was normal. The   estimated ejection fraction was in the range of 55% to 60%. Wall   motion was normal; there were no regional wall motion   abnormalities. Doppler parameters are consistent with abnormal   left ventricular relaxation (grade 1 diastolic dysfunction). - Ventricular  septum: Septal motion showed abnormal function and   dyssynergy. - Aortic valve: There was trivial regurgitation. - Mitral valve: There was mild regurgitation. - Right atrium: The atrium was mildly dilated. - Pericardium, extracardiac: A trivial pericardial effusion was   identified posterior to the heart. There was a left pleural   effusion.  Antimicrobials:  Ceftriaxone  Azithromycin    Subjective: Dyspnea improved. No chest pain.  Objective: Vitals:   05/06/17 0747 05/06/17 1212 05/06/17 1310 05/06/17 1632  BP: (!) 154/116 110/79 (!) 129/99 (!) 123/96  Pulse: (!) 105 88  89  Resp: (!) 23 20  15   Temp: 99.2 F (37.3 C) 98.3 F (36.8 C)  98.3 F (36.8 C)  TempSrc: Oral Oral  Oral  SpO2: 96% 98%  97%  Weight:      Height:        Intake/Output Summary (Last 24 hours) at 05/06/2017 1658 Last data filed at 05/06/2017 1400 Gross per 24 hour  Intake 1438.2 ml  Output 2450 ml  Net -1011.8 ml   Filed Weights   05/05/17 1636 05/05/17 2227 05/06/17 0500  Weight: 96.2 kg (212 lb) 100.5 kg (221 lb 9 oz) 99.6 kg (219 lb 9.3 oz)    Examination:  General exam: Appears calm and comfortable Respiratory system: Clear to auscultation. Respiratory effort normal. Cardiovascular system: S1, S2 split, RRR. Gastrointestinal system: Abdomen is nondistended, soft and nontender. Normal bowel sounds heard. Central nervous system: Alert and oriented. No focal neurological deficits. Extremities: 1+ edema. No calf tenderness Skin: No cyanosis. No rashes Psychiatry: Judgement and insight appear normal. Mood & affect appropriate.     Data Reviewed: I have personally reviewed following labs and imaging studies  CBC: Recent  Labs  Lab 05/05/17 1640 05/06/17 0436  WBC 4.5 4.4  NEUTROABS  --  3.9  HGB 15.2 12.5*  HCT 45.5 38.6*  MCV 89.4 90.0  PLT 160 154*   Basic Metabolic Panel: Recent Labs  Lab 05/05/17 1640 05/06/17 0436  NA 137 136  K 3.6 4.3  CL 106 104  CO2 22 22    GLUCOSE 103* 121*  BUN 36* 29*  CREATININE 2.44* 2.40*  CALCIUM 9.2 8.5*   GFR: Estimated Creatinine Clearance: 43.5 mL/min (A) (by C-G formula based on SCr of 2.4 mg/dL (H)). Liver Function Tests: No results for input(s): AST, ALT, ALKPHOS, BILITOT, PROT, ALBUMIN in the last 168 hours. No results for input(s): LIPASE, AMYLASE in the last 168 hours. No results for input(s): AMMONIA in the last 168 hours. Coagulation Profile: No results for input(s): INR, PROTIME in the last 168 hours. Cardiac Enzymes: Recent Labs  Lab 05/05/17 1640 05/05/17 2318 05/06/17 0436 05/06/17 1112  TROPONINI 0.09* 0.06* 0.06* 0.05*   BNP (last 3 results) No results for input(s): PROBNP in the last 8760 hours. HbA1C: No results for input(s): HGBA1C in the last 72 hours. CBG: No results for input(s): GLUCAP in the last 168 hours. Lipid Profile: No results for input(s): CHOL, HDL, LDLCALC, TRIG, CHOLHDL, LDLDIRECT in the last 72 hours. Thyroid Function Tests: No results for input(s): TSH, T4TOTAL, FREET4, T3FREE, THYROIDAB in the last 72 hours. Anemia Panel: No results for input(s): VITAMINB12, FOLATE, FERRITIN, TIBC, IRON, RETICCTPCT in the last 72 hours. Sepsis Labs: No results for input(s): PROCALCITON, LATICACIDVEN in the last 168 hours.  Recent Results (from the past 240 hour(s))  MRSA PCR Screening     Status: Abnormal   Collection Time: 05/05/17 10:19 PM  Result Value Ref Range Status   MRSA by PCR INVALID RESULTS, SPECIMEN SENT FOR CULTURE (A) NEGATIVE Final    Comment: NOTIFIED HITT,L RN AT 0111 ON [id number] SKEEN,P        The GeneXpert MRSA Assay (FDA approved for NASAL specimens only), is one component of a comprehensive MRSA colonization surveillance program. It is not intended to diagnose MRSA infection nor to guide or monitor treatment for MRSA infections.          Radiology Studies: Dg Chest 2 View  Result Date: 05/05/2017 CLINICAL DATA:  Cough and dyspnea x1 week.   Sarcoid. EXAM: CHEST  2 VIEW COMPARISON:  05/15/2015 FINDINGS: Cardiomegaly. No aortic aneurysm Bibasilar atelectasis with superimposed pneumonia in the right lower lobe. There is mild pulmonary vascular congestion and minimal pleural thickening or trace effusion along the periphery of the right lung base. No acute osseous abnormality. IMPRESSION: 1. Stable cardiomegaly. 2. Right lower lobe pulmonary consolidation consistent with pneumonia. 3. Mild pulmonary vascular congestion with probable trace right effusion or pleural thickening at the right lung base. Electronically Signed   By: Ashley Royalty M.D.   On: 05/05/2017 16:58        Scheduled Meds: . amLODipine  10 mg Oral Daily  . carvedilol  12.5 mg Oral BID WC  . furosemide  40 mg Intravenous Q12H  . heparin  5,000 Units Subcutaneous Q8H  . hydrALAZINE  100 mg Oral Q8H  . sodium chloride flush  3 mL Intravenous Q12H   Continuous Infusions: . sodium chloride    . azithromycin    . cefTRIAXone (ROCEPHIN)  IV    . nitroGLYCERIN 10 mcg/min (05/06/17 1627)     LOS: 1 day     Cordelia Poche, MD [facility] 05/06/2017, 4:58 PM Pager: [phone]  If 7PM-7AM, please contact night-coverage [email or web address] Password [facility] 05/06/2017, 4:58 PM

## 2017-05-07 DIAGNOSIS — I5033 Acute on chronic diastolic (congestive) heart failure: Secondary | ICD-10-CM

## 2017-05-07 LAB — BASIC METABOLIC PANEL
Anion gap: 10 (ref 5–15)
BUN: 39 mg/dL — AB (ref 6–20)
CHLORIDE: 103 mmol/L (ref 101–111)
CO2: 22 mmol/L (ref 22–32)
CREATININE: 2.84 mg/dL — AB (ref 0.61–1.24)
Calcium: 8.7 mg/dL — ABNORMAL LOW (ref 8.9–10.3)
GFR, EST AFRICAN AMERICAN: 28 mL/min — AB (ref >60)
GFR, EST NON AFRICAN AMERICAN: 24 mL/min — AB (ref >60)
Glucose, Bld: 106 mg/dL — ABNORMAL HIGH (ref 65–99)
Potassium: 3.5 mmol/L (ref 3.5–5.1)
SODIUM: 135 mmol/L (ref 135–145)

## 2017-05-07 LAB — MRSA CULTURE: Culture: NOT DETECTED

## 2017-05-07 MED ORDER — AZITHROMYCIN 500 MG PO TABS: 500.0000 mg | ORAL_TABLET | Freq: Every evening | ORAL | Status: DC

## 2017-05-07 MED ORDER — AMOXICILLIN-POT CLAVULANATE 500-125 MG PO TABS
1.0000 | ORAL_TABLET | Freq: Two times a day (BID) | ORAL | Status: DC
  Administered 2017-05-07 – 2017-05-08 (×3): 500 mg via ORAL
  Filled 2017-05-07 (×4): qty 1

## 2017-05-07 MED ORDER — CARVEDILOL 25 MG PO TABS
25.0000 mg | ORAL_TABLET | Freq: Two times a day (BID) | ORAL | Status: DC
  Administered 2017-05-07 – 2017-05-08 (×3): 25 mg via ORAL
  Filled 2017-05-07 (×3): qty 1

## 2017-05-07 NOTE — Progress Notes (Signed)
PHARMACIST - PHYSICIAN COMMUNICATION DR:   Dyann Kief CONCERNING: Antibiotic IV to Oral Route Change Policy  RECOMMENDATION: This patient is receiving Azithromcyin by the intravenous route.  Based on criteria approved by the Pharmacy and Therapeutics Committee, the antibiotic(s) is/are being converted to the equivalent oral dose form(s).  DESCRIPTION: These criteria include:  Patient being treated for a respiratory tract infection, urinary tract infection, cellulitis or clostridium difficile associated diarrhea if on metronidazole  The patient is not neutropenic and does not exhibit a GI malabsorption state  The patient is eating (either orally or via tube) and/or has been taking other orally administered medications for a least 24 hours  The patient is improving clinically and has a Tmax < 100.5  If you have questions about this conversion, please contact the Pharmacy Department  []   [phone] )  [facility] []   [phone] )  [facility] [x]   [phone] )  [facility] []   [phone] )  [facility] []   [phone] )  [place], PharmD, BCPS 9:57 AM

## 2017-05-07 NOTE — Progress Notes (Signed)
PROGRESS NOTE    Ian Gregory.  IRW:[id number] DOB: 1965-10-16 DOA: 05/05/2017 PCP: [name], FNP   Brief Narrative: Ian Stay. is a 51 y.o. male with medical history significant for chronic kidney disease stage III, hypertension, and possible sarcoidosis. Patient here with hypertensive urgency and CHF exacerbation. Diuresing and on antibiotics.   Assessment & Plan:   Principal Problem:   CAP (community acquired pneumonia) Active Problems:   Pulmonary sarcoidosis/clinical dx only    Accelerated hypertension/Hypertensive crisis   CKD (chronic kidney disease) stage 3, GFR 30-59 ml/min (HCC)   Acute CHF (congestive heart failure) ([place])   Community acquired pneumonia/clinical diagnosis for pulmonary sarcoidosis -no fever, breathing improving -normal WBC's -received 2 days of IV antibiotics; will now transition to Augmentin PO and complete a total of 5 days antibiotics. -patient oxygen titrated off to RA -will repeat CXR in am  -will benefit of outpatient follow up with pulmonary service regarding further evaluation and work up for presumed Lungs Sarcoid  Acute on chronic diastolic heart failure -Echocardiogram significant for an EF of 55-60% with grade 1 diastolic dysfunction. No wall motion abnormalities  -breathing continue improving -continue daily weights and strict intake and output -follow heart healthy diet -will give one more day of IV lasix and then transition to PO  Hypertensive urgency -resolved and off nitro drip now -patient started on carvedilol (dose adjusted to 25mg  BID), amlodipine, lasix and hydralazine  -no ACE or ARB in setting of CKD -advise to follow low sodium diet.  CKD stage III -overall stable in comparison to baseline -will monitor trend closely while receiving diuresis -GFR indicating stage 3 chronic disease  Elevated troponin -no CP -2-D echo reassuring  -most likely associated with acute on chronic HF in the setting of  CKD. -no acute ischemic changes on EKG or telemetry -will continue to monitor -continue b-blocker and Heart healthy diet   DVT prophylaxis: Heparin subq Code Status: full code Family Communication: no family at bedside  Disposition Plan: will transfer out of stepdown, continue IV diuresis for another 24 hours; transition antibiotics to PO and increase activity. Most likely home in 24 hours.    Consultants:   None  Procedures:   Echocardiogram - Left ventricle: The cavity size was normal. There was severe   concentric hypertrophy. Systolic function was normal. The   estimated ejection fraction was in the range of 55% to 60%. Wall   motion was normal; there were no regional wall motion   abnormalities. Doppler parameters are consistent with abnormal   left ventricular relaxation (grade 1 diastolic dysfunction). - Ventricular septum: Septal motion showed abnormal function and   dyssynergy. - Aortic valve: There was trivial regurgitation. - Mitral valve: There was mild regurgitation. - Right atrium: The atrium was mildly dilated. - Pericardium, extracardiac: A trivial pericardial effusion was   identified posterior to the heart. There was a left pleural   effusion.  Antimicrobials:  Ceftriaxone and Azithromycin 12/16>>>>12/18  Augmentin 12/18   Subjective: Feeling better; denies CP and SOB (at least at rest); still with swelling on his legs. No nausea, no vomiting, no abd pain.   Objective: Vitals:   05/07/17 0345 05/07/17 0613 05/07/17 0635 05/07/17 0830  BP: (!) 151/115  (!) 155/110 (!) 151/104  Pulse:    83  Resp: (!) 28   18  Temp: 98.3 F (36.8 C)   98.3 F (36.8 C)  TempSrc: Oral   Oral  SpO2: 97%   98%  Weight:  100 kg (220 lb 7.4 oz)    Height:        Intake/Output Summary (Last 24 hours) at 05/07/2017 1009 Last data filed at 05/07/2017 0916 Gross per 24 hour  Intake 1307.88 ml  Output 1125 ml  Net 182.88 ml   Filed Weights   05/05/17 2227  05/06/17 0500 05/07/17 0613  Weight: 100.5 kg (221 lb 9 oz) 99.6 kg (219 lb 9.3 oz) 100 kg (220 lb 7.4 oz)    Examination: General exam: afebrile, no CP, no nausea, no vomiting; reports breathing is much better. Still with leg swelling. Now off nitro drip. Respiratory system: fine crackles at bases bilaterally and positive rhonchi (R > L); no wheezing, overall with improved air movement, normal resp effort.  Cardiovascular system: S1 and S2, RRR, no rubs, no gallops, positive SEM. Gastrointestinal system: soft, NT, ND, positive BS Central nervous system: CN intact, no focal motor or neurologic deficit appreciated.  Extremities: 1++ edema bilaterally, no cyanosis, no clubbing  Skin: no open ulcers, no rash, no petechiae  Psychiatry: judgement and insight are normal; no hallucinations or SI.    Data Reviewed: I have personally reviewed following labs and imaging studies  CBC: Recent Labs  Lab 05/05/17 1640 05/06/17 0436  WBC 4.5 4.4  NEUTROABS  --  3.9  HGB 15.2 12.5*  HCT 45.5 38.6*  MCV 89.4 90.0  PLT 160 314*   Basic Metabolic Panel: Recent Labs  Lab 05/05/17 1640 05/06/17 0436 05/07/17 0355  NA 137 136 135  K 3.6 4.3 3.5  CL 106 104 103  CO2 22 22 22   GLUCOSE 103* 121* 106*  BUN 36* 29* 39*  CREATININE 2.44* 2.40* 2.84*  CALCIUM 9.2 8.5* 8.7*   GFR: Estimated Creatinine Clearance: 36.8 mL/min (A) (by C-G formula based on SCr of 2.84 mg/dL (H)).  Cardiac Enzymes: Recent Labs  Lab 05/05/17 1640 05/05/17 2318 05/06/17 0436 05/06/17 1112  TROPONINI 0.09* 0.06* 0.06* 0.05*    Recent Results (from the past 240 hour(s))  MRSA PCR Screening     Status: Abnormal   Collection Time: 05/05/17 10:19 PM  Result Value Ref Range Status   MRSA by PCR INVALID RESULTS, SPECIMEN SENT FOR CULTURE (A) NEGATIVE Final    Comment: NOTIFIED HITT,L RN AT 0111 ON [id number] SKEEN,P        The GeneXpert MRSA Assay (FDA approved for NASAL specimens only), is one component of  a comprehensive MRSA colonization surveillance program. It is not intended to diagnose MRSA infection nor to guide or monitor treatment for MRSA infections.   MRSA culture     Status: None   Collection Time: 05/05/17 10:19 PM  Result Value Ref Range Status   Specimen Description NASAL SWAB  Final   Special Requests NONE  Final   Culture NO MRSA DETECTED  Final   Report Status 05/07/2017 FINAL  Final     Radiology Studies: Dg Chest 2 View  Result Date: 05/05/2017 CLINICAL DATA:  Cough and dyspnea x1 week.  Sarcoid. EXAM: CHEST  2 VIEW COMPARISON:  05/15/2015 FINDINGS: Cardiomegaly. No aortic aneurysm Bibasilar atelectasis with superimposed pneumonia in the right lower lobe. There is mild pulmonary vascular congestion and minimal pleural thickening or trace effusion along the periphery of the right lung base. No acute osseous abnormality. IMPRESSION: 1. Stable cardiomegaly. 2. Right lower lobe pulmonary consolidation consistent with pneumonia. 3. Mild pulmonary vascular congestion with probable trace right effusion or pleural thickening at the right lung base. Electronically Signed  By: Ian Gregory M.D.   On: 05/05/2017 16:58    Scheduled Meds: . amLODipine  10 mg Oral Daily  . amoxicillin-clavulanate  1 tablet Oral Q12H  . carvedilol  25 mg Oral BID WC  . furosemide  40 mg Intravenous Q12H  . heparin  5,000 Units Subcutaneous Q8H  . hydrALAZINE  100 mg Oral Q8H  . sodium chloride flush  3 mL Intravenous Q12H   Continuous Infusions: . sodium chloride       LOS: 2 days     Barton Dubois, MD [facility] 05/07/2017, 10:09 AM Pager: [phone]  If 7PM-7AM, please contact night-coverage [email or web address] Password [email or web address] 05/07/2017, 10:09 AM

## 2017-05-07 NOTE — Care Management Note (Signed)
Case Management Note  Patient Details  Name: Ian Gregory. MRN: [id number] Date of Birth: 14-Apr-1966  Subjective/Objective:   From home alone, pta indep,  PCP has left but he will find another PCP in the same office at [facility], he has medication coverage.                   Action/Plan: NCM will follow for dc needs.   Expected Discharge Date:                  Expected Discharge Plan:  Home/Self Care  In-House Referral:     Discharge planning Services  CM Consult  Post Acute Care Choice:    Choice offered to:     DME Arranged:    DME Agency:     HH Arranged:    [place] Agency:     Status of Service:  Completed, signed off  If discussed at [employer] of Stay Meetings, dates discussed:    Additional Comments:  Zenon Mayo, RN 05/07/2017, 12:13 PM

## 2017-05-07 NOTE — Progress Notes (Signed)
0300 - pt had very disturbing guest in room, very loud, rude and appeared intoxicated. Security was called and after talking to her she decided to leave with them.   Pt refused standing weight and stated that he wanted to wait until day shift, will pass along to RN.

## 2017-05-08 ENCOUNTER — Inpatient Hospital Stay (HOSPITAL_COMMUNITY): Payer: Commercial Managed Care - PPO

## 2017-05-08 DIAGNOSIS — I5023 Acute on chronic systolic (congestive) heart failure: Secondary | ICD-10-CM

## 2017-05-08 DIAGNOSIS — R0602 Shortness of breath: Secondary | ICD-10-CM

## 2017-05-08 DIAGNOSIS — J99 Respiratory disorders in diseases classified elsewhere: Secondary | ICD-10-CM

## 2017-05-08 DIAGNOSIS — D869 Sarcoidosis, unspecified: Secondary | ICD-10-CM

## 2017-05-08 DIAGNOSIS — I5033 Acute on chronic diastolic (congestive) heart failure: Secondary | ICD-10-CM

## 2017-05-08 LAB — BASIC METABOLIC PANEL
ANION GAP: 11 (ref 5–15)
BUN: 34 mg/dL — ABNORMAL HIGH (ref 6–20)
CO2: 23 mmol/L (ref 22–32)
Calcium: 8.5 mg/dL — ABNORMAL LOW (ref 8.9–10.3)
Chloride: 104 mmol/L (ref 101–111)
Creatinine, Ser: 2.65 mg/dL — ABNORMAL HIGH (ref 0.61–1.24)
GFR, EST AFRICAN AMERICAN: 30 mL/min — AB (ref >60)
GFR, EST NON AFRICAN AMERICAN: 26 mL/min — AB (ref >60)
GLUCOSE: 85 mg/dL (ref 65–99)
POTASSIUM: 3.7 mmol/L (ref 3.5–5.1)
Sodium: 138 mmol/L (ref 135–145)

## 2017-05-08 MED ORDER — ISOSORB DINITRATE-HYDRALAZINE 20-37.5 MG PO TABS: 1.0000 | ORAL_TABLET | Freq: Two times a day (BID) | ORAL | 1 refills | Status: DC

## 2017-05-08 MED ORDER — FUROSEMIDE 40 MG PO TABS: 40.0000 mg | ORAL_TABLET | Freq: Two times a day (BID) | ORAL | 1 refills | Status: DC

## 2017-05-08 MED ORDER — AMOXICILLIN-POT CLAVULANATE 500-125 MG PO TABS: 1.0000 | ORAL_TABLET | Freq: Two times a day (BID) | ORAL | 0 refills | Status: AC

## 2017-05-08 MED ORDER — AMLODIPINE BESYLATE 5 MG PO TABS: 5.0000 mg | ORAL_TABLET | Freq: Every day | ORAL | 1 refills | Status: DC

## 2017-05-08 MED ORDER — CARVEDILOL 25 MG PO TABS: 25.0000 mg | ORAL_TABLET | Freq: Two times a day (BID) | ORAL | 1 refills | Status: AC

## 2017-05-08 NOTE — Progress Notes (Signed)
Patient given discharge instructions and all questions answered.  

## 2017-05-08 NOTE — Discharge Summary (Signed)
Physician Discharge Summary  Ian Gregory. ZLD:[id number] DOB: 09-Aug-1965 DOA: 05/05/2017  PCP: [name], FNP  Admit date: 05/05/2017 Discharge date: 05/08/2017  Time spent: 35 minutes  Recommendations for Outpatient Follow-up:  1. Repeat basic metabolic panel to follow electrolytes and renal function 2. Reassess volume status and blood pressure with further adjustment into his antihypertensive/diuretic regimen as needed. 3. Repeat chest x-ray in 4-6 weeks to assure resolution of infiltrates. 4. Patient will benefit of outpatient visit with a pulmonary service in order to further evaluate the concern for lung sarcoidosis.  Discharge Diagnoses:  Principal Problem:   CAP (community acquired pneumonia) Active Problems:   Pulmonary sarcoidosis/clinical dx only    Accelerated hypertension   Hypertensive crisis   CKD (chronic kidney disease) stage 3, GFR 30-59 ml/min (HCC)   Acute CHF (congestive heart failure) (HCC)   SOB (shortness of breath)   Acute on chronic systolic CHF (congestive heart failure) (HCC)   Acute on chronic diastolic CHF (congestive heart failure) ([place])   Discharge Condition: Stable and improved.  Patient discharged home with instructions to take medication as prescribed and to arrange follow-up with PCP in 10 days.  Diet recommendation: Heart healthy diet.  Filed Weights   05/07/17 9030 05/07/17 1355 05/08/17 0506  Weight: 100 kg (220 lb 7.4 oz) 99.2 kg (218 lb 12.8 oz) 97.2 kg (214 lb 3.2 oz)    History of present illness:  As per H&P written by Dr. Mitzi Gregory on 05/05/17 51 y.o. male with medical history significant for chronic kidney disease stage III, hypertension, and possible sarcoidosis, now presenting to the emergency department for evaluation of progressive dyspnea and leg swelling.  Patient reports that he has had bilateral leg edema for more than a month now but was otherwise well until approximately 1 week ago when he began to notice  dyspnea with minimal exertion.  Over the past week, shortness of breath has progressively worsened.  He denies any fevers or chills and denies any significant cough.  There has not been any chest pain associated with this.  Patient reports that he had not taken his blood pressure medicine in "a couple months."  Patient admitted secondary to community-acquired pneumonia, acute on chronic heart failure and hypertensive urgency.  Hospital Course:  Community acquired pneumonia/clinical diagnosis for pulmonary sarcoidosis -no fever, breathing significantly improved -normal WBC's -received 2 days of IV antibiotics; will now transition to Augmentin PO and complete a total of 6 days antibiotics after discharge. -patient oxygen titrated off to RA and is within normal limits.   -will recommend repeat CXR in 4-6 weeks to guarantee resolution of infiltrates. -will benefit of outpatient follow up with pulmonary service regarding further evaluation and work up for presumed Lungs Sarcoidosis  Acute on chronic diastolic heart failure -Echocardiogram significant for an EF of 55-60% with grade 1 diastolic dysfunction. No wall motion abnormalities  -continue daily weights and low-sodium diet  -Patient discharge on Lasix 40 mg twice a day and also a combination of hydralazine/Imdur (BiDil), carvedilol and Norvasc for blood pressure control. -Needs follow-up with PCP for further adjustment on his medications.  Hypertensive urgency -resolved and overall stable at discharge -as mentioned above will discharge on BIdil, lasix, carvedilol and norvasc -no ACE or ARB in setting of renal failure -advise to follow low sodium diet.  CKD stage III -overall stable in comparison to his baseline -will recommend close follow-up with a basic metabolic panel at his next visit to assess renal function trend. -GFR  indicating stage 3 chronic disease  Elevated troponin -no CP -2-D echo reassuring  -most likely associated  with acute on chronic HF in the setting of CKD. -no acute ischemic changes on EKG or telemetry -continue b-blocker and Heart healthy diet   Procedures:  Echocardiogram - Left ventricle: The cavity size was normal. There was severe concentric hypertrophy. Systolic function was normal. The estimated ejection fraction was in the range of 55% to 60%. Wall motion was normal; there were no regional wall motion abnormalities. Doppler parameters are consistent with abnormal left ventricular relaxation (grade 1 diastolic dysfunction). - Ventricular septum: Septal motion showed abnormal function and dyssynergy. - Aortic valve: There was trivial regurgitation. - Mitral valve: There was mild regurgitation. - Right atrium: The atrium was mildly dilated. - Pericardium, extracardiac: A trivial pericardial effusion was identified posterior to the heart. There was a left pleural     Consultations:  None   Discharge Exam: Vitals:   05/08/17 1020 05/08/17 1131  BP: 136/86 (!) 143/94  Pulse: 95 86  Resp: 20 20  Temp: 98.1 F (36.7 C) 98.2 F (36.8 C)  SpO2: 96% 100%   General exam: afebrile, no CP, no nausea, no vomiting; reports breathing is significantly much better. Still with leg swelling, but much improved as well. Respiratory system: no frank crackles; improved air movement, no wheezing. positive scattered rhonchi.  Cardiovascular system: S1 and S2, RRR, no rubs, no gallops, positive SEM. Gastrointestinal system: soft, NT, ND, positive BS Central nervous system: CN intact, no focal motor or neurologic deficit appreciated.  Extremities: 1++ edema bilaterally, no cyanosis, no clubbing  Skin: no open ulcers, no rash, no petechiae  Psychiatry: judgement and insight are normal; no hallucinations or SI.     Discharge Instructions   Discharge Instructions    (HEART FAILURE PATIENTS) Call MD:  Anytime you have any of the following symptoms: 1) 3 pound weight gain in 24  hours or 5 pounds in 1 week 2) shortness of breath, with or without a dry hacking cough 3) swelling in the hands, feet or stomach 4) if you have to sleep on extra pillows at night in order to breathe.   Complete by:  As directed    Diet - low sodium heart healthy   Complete by:  As directed    Discharge instructions   Complete by:  As directed    Arrange follow-up with PCP in 10 days Low-sodium diet (less than 2 g/day) Maintain adequate hydration Check weight on daily basis and take medications as prescribed.     Allergies as of 05/08/2017   No Known Allergies     Medication List    TAKE these medications   amLODipine 5 MG tablet Commonly known as:  NORVASC Take 1 tablet (5 mg total) by mouth daily. What changed:    medication strength  how much to take   amoxicillin-clavulanate 500-125 MG tablet Commonly known as:  AUGMENTIN Take 1 tablet (500 mg total) by mouth every 12 (twelve) hours for 6 days.   carvedilol 25 MG tablet Commonly known as:  COREG Take 1 tablet (25 mg total) by mouth 2 (two) times daily with a meal. What changed:    medication strength  how much to take   furosemide 40 MG tablet Commonly known as:  LASIX Take 1 tablet (40 mg total) by mouth 2 (two) times daily.   isosorbide-hydrALAZINE 20-37.5 MG tablet Commonly known as:  BIDIL Take 1 tablet by mouth 2 (two) times daily.  No Known Allergies Follow-up Information    [name], FNP. Go on 05/20/2017.   Specialty:  Nurse Practitioner Why:  @10 :45am Contact information: [address] [place] [address] [phone]            The results of significant diagnostics from this hospitalization (including imaging, microbiology, ancillary and laboratory) are listed below for reference.    Significant Diagnostic Studies: Dg Chest 2 View  Result Date: 05/08/2017 CLINICAL DATA:  Short of breath EXAM: CHEST  2 VIEW COMPARISON:  05/05/2017 FINDINGS: Mild  pulmonary vascular congestion unchanged Progression of bibasilar airspace disease. Small bilateral effusions unchanged. Cardiac enlargement. IMPRESSION: Mild pulmonary vascular congestion unchanged Progressive bibasilar atelectasis/ infiltrate. Small pleural effusions unchanged. Electronically Signed   By: Franchot Gallo M.D.   On: 05/08/2017 07:42   Dg Chest 2 View  Result Date: 05/05/2017 CLINICAL DATA:  Cough and dyspnea x1 week.  Sarcoid. EXAM: CHEST  2 VIEW COMPARISON:  05/15/2015 FINDINGS: Cardiomegaly. No aortic aneurysm Bibasilar atelectasis with superimposed pneumonia in the right lower lobe. There is mild pulmonary vascular congestion and minimal pleural thickening or trace effusion along the periphery of the right lung base. No acute osseous abnormality. IMPRESSION: 1. Stable cardiomegaly. 2. Right lower lobe pulmonary consolidation consistent with pneumonia. 3. Mild pulmonary vascular congestion with probable trace right effusion or pleural thickening at the right lung base. Electronically Signed   By: Ashley Royalty M.D.   On: 05/05/2017 16:58    Microbiology: Recent Results (from the past 240 hour(s))  MRSA PCR Screening     Status: Abnormal   Collection Time: 05/05/17 10:19 PM  Result Value Ref Range Status   MRSA by PCR INVALID RESULTS, SPECIMEN SENT FOR CULTURE (A) NEGATIVE Final    Comment: NOTIFIED HITT,L RN AT 0111 ON [id number] SKEEN,P        The GeneXpert MRSA Assay (FDA approved for NASAL specimens only), is one component of a comprehensive MRSA colonization surveillance program. It is not intended to diagnose MRSA infection nor to guide or monitor treatment for MRSA infections.   MRSA culture     Status: None   Collection Time: 05/05/17 10:19 PM  Result Value Ref Range Status   Specimen Description NASAL SWAB  Final   Special Requests NONE  Final   Culture NO MRSA DETECTED  Final   Report Status 05/07/2017 FINAL  Final     Labs: Basic Metabolic Panel: Recent Labs   Lab 05/05/17 1640 05/06/17 0436 05/07/17 0355 05/08/17 0421  NA 137 136 135 138  K 3.6 4.3 3.5 3.7  CL 106 104 103 104  CO2 22 22 22 23   GLUCOSE 103* 121* 106* 85  BUN 36* 29* 39* 34*  CREATININE 2.44* 2.40* 2.84* 2.65*  CALCIUM 9.2 8.5* 8.7* 8.5*   CBC: Recent Labs  Lab 05/05/17 1640 05/06/17 0436  WBC 4.5 4.4  NEUTROABS  --  3.9  HGB 15.2 12.5*  HCT 45.5 38.6*  MCV 89.4 90.0  PLT 160 147*   Cardiac Enzymes: Recent Labs  Lab 05/05/17 1640 05/05/17 2318 05/06/17 0436 05/06/17 1112  TROPONINI 0.09* 0.06* 0.06* 0.05*   BNP: BNP (last 3 results) Recent Labs    05/05/17 1640  BNP 1,572.6*    Signed:  Barton Dubois MD.  [facility] 05/08/2017, 3:36 PM

## 2017-05-08 NOTE — Progress Notes (Signed)
Patient had 17 beats of V-tach. Patient was asymptomatic and resting. MD was notified. Vitals: BP 155/103, HR-99, RR-18, and SpO2- 99%. Will continue to monitor.   Drue Flirt, RN

## 2017-05-28 DIAGNOSIS — R600 Localized edema: Secondary | ICD-10-CM | POA: Insufficient documentation

## 2019-06-02 DIAGNOSIS — J1282 Pneumonia due to coronavirus disease 2019: Secondary | ICD-10-CM | POA: Insufficient documentation

## 2020-01-20 ENCOUNTER — Ambulatory Visit: Payer: Commercial Managed Care - PPO | Attending: Critical Care Medicine

## 2020-01-20 ENCOUNTER — Other Ambulatory Visit: Payer: Self-pay

## 2020-01-20 ENCOUNTER — Ambulatory Visit: Payer: Commercial Managed Care - PPO | Admitting: *Deleted

## 2020-01-20 DIAGNOSIS — Z23 Encounter for immunization: Secondary | ICD-10-CM

## 2020-01-20 NOTE — Progress Notes (Signed)
Patient tolerated injection well today 

## 2020-01-20 NOTE — Progress Notes (Signed)
   [id number]  Name:  Ian Gregory.    MRN: [id number] DOB: [birth year]  01/26/2020  [name] was observed post Covid-19 immunization for 15 minutes without incident. He was provided with Vaccine Information Sheet and instruction to access the V-Safe system.   Mr. Bee was instructed to call 911 with any severe reactions post vaccine: Difficulty breathing  Swelling of face and throat  A fast heartbeat  A bad rash all over body  Dizziness and weakness   Immunizations Administered     Name Date Dose VIS Date Route   Moderna COVID-19 Vaccine 01/20/2020  4:40 PM 0.5 mL 04/2019 Intramuscular   Manufacturer: Moderna   Lot: [id number]   [place]: [id number]

## 2020-10-05 ENCOUNTER — Emergency Department (HOSPITAL_BASED_OUTPATIENT_CLINIC_OR_DEPARTMENT_OTHER): Payer: Commercial Managed Care - PPO | Admitting: Radiology

## 2020-10-05 ENCOUNTER — Inpatient Hospital Stay (HOSPITAL_BASED_OUTPATIENT_CLINIC_OR_DEPARTMENT_OTHER)
Admission: EM | Admit: 2020-10-05 | Discharge: 2020-10-11 | DRG: 291 | Disposition: A | Discharge status: Home / Self Care | Payer: Commercial Managed Care - PPO | Source: Ambulatory Visit | Attending: Internal Medicine | Admitting: Internal Medicine

## 2020-10-05 ENCOUNTER — Encounter (HOSPITAL_BASED_OUTPATIENT_CLINIC_OR_DEPARTMENT_OTHER): Payer: Self-pay

## 2020-10-05 ENCOUNTER — Other Ambulatory Visit: Payer: Self-pay

## 2020-10-05 DIAGNOSIS — N179 Acute kidney failure, unspecified: Secondary | ICD-10-CM | POA: Diagnosis not present

## 2020-10-05 DIAGNOSIS — Z833 Family history of diabetes mellitus: Secondary | ICD-10-CM

## 2020-10-05 DIAGNOSIS — D860 Sarcoidosis of lung: Secondary | ICD-10-CM | POA: Diagnosis present

## 2020-10-05 DIAGNOSIS — E872 Acidosis: Secondary | ICD-10-CM | POA: Diagnosis present

## 2020-10-05 DIAGNOSIS — Z20822 Contact with and (suspected) exposure to covid-19: Secondary | ICD-10-CM | POA: Diagnosis present

## 2020-10-05 DIAGNOSIS — N183 Chronic kidney disease, stage 3 unspecified: Secondary | ICD-10-CM | POA: Diagnosis present

## 2020-10-05 DIAGNOSIS — R0602 Shortness of breath: Secondary | ICD-10-CM

## 2020-10-05 DIAGNOSIS — R8281 Pyuria: Secondary | ICD-10-CM | POA: Diagnosis present

## 2020-10-05 DIAGNOSIS — I161 Hypertensive emergency: Secondary | ICD-10-CM | POA: Diagnosis present

## 2020-10-05 DIAGNOSIS — I509 Heart failure, unspecified: Secondary | ICD-10-CM

## 2020-10-05 DIAGNOSIS — N2581 Secondary hyperparathyroidism of renal origin: Secondary | ICD-10-CM | POA: Diagnosis present

## 2020-10-05 DIAGNOSIS — E785 Hyperlipidemia, unspecified: Secondary | ICD-10-CM | POA: Diagnosis present

## 2020-10-05 DIAGNOSIS — Z8249 Family history of ischemic heart disease and other diseases of the circulatory system: Secondary | ICD-10-CM

## 2020-10-05 DIAGNOSIS — Z6833 Body mass index (BMI) 33.0-33.9, adult: Secondary | ICD-10-CM

## 2020-10-05 DIAGNOSIS — I10 Essential (primary) hypertension: Secondary | ICD-10-CM | POA: Diagnosis present

## 2020-10-05 DIAGNOSIS — R319 Hematuria, unspecified: Secondary | ICD-10-CM | POA: Diagnosis present

## 2020-10-05 DIAGNOSIS — N184 Chronic kidney disease, stage 4 (severe): Secondary | ICD-10-CM | POA: Diagnosis present

## 2020-10-05 DIAGNOSIS — I5033 Acute on chronic diastolic (congestive) heart failure: Secondary | ICD-10-CM | POA: Diagnosis present

## 2020-10-05 DIAGNOSIS — N400 Enlarged prostate without lower urinary tract symptoms: Secondary | ICD-10-CM | POA: Diagnosis present

## 2020-10-05 DIAGNOSIS — I130 Hypertensive heart and chronic kidney disease with heart failure and stage 1 through stage 4 chronic kidney disease, or unspecified chronic kidney disease: Secondary | ICD-10-CM | POA: Diagnosis not present

## 2020-10-05 DIAGNOSIS — E669 Obesity, unspecified: Secondary | ICD-10-CM | POA: Diagnosis present

## 2020-10-05 DIAGNOSIS — Z79899 Other long term (current) drug therapy: Secondary | ICD-10-CM

## 2020-10-05 DIAGNOSIS — Z841 Family history of disorders of kidney and ureter: Secondary | ICD-10-CM

## 2020-10-05 DIAGNOSIS — D631 Anemia in chronic kidney disease: Secondary | ICD-10-CM | POA: Diagnosis present

## 2020-10-05 DIAGNOSIS — N189 Chronic kidney disease, unspecified: Secondary | ICD-10-CM

## 2020-10-05 DIAGNOSIS — R823 Hemoglobinuria: Secondary | ICD-10-CM | POA: Diagnosis present

## 2020-10-05 HISTORY — DX: Heart failure, unspecified: I50.9

## 2020-10-05 LAB — CBC WITH DIFFERENTIAL/PLATELET
Abs Immature Granulocytes: 0.01 10*3/uL (ref 0.00–0.07)
Basophils Absolute: 0 10*3/uL (ref 0.0–0.1)
Basophils Relative: 0 %
Eosinophils Absolute: 0.1 10*3/uL (ref 0.0–0.5)
Eosinophils Relative: 2 %
HCT: 37.1 % — ABNORMAL LOW (ref 39.0–52.0)
Hemoglobin: 12.1 g/dL — ABNORMAL LOW (ref 13.0–17.0)
Immature Granulocytes: 0 %
Lymphocytes Relative: 25 %
Lymphs Abs: 1.4 10*3/uL (ref 0.7–4.0)
MCH: 30.6 pg (ref 26.0–34.0)
MCHC: 32.6 g/dL (ref 30.0–36.0)
MCV: 93.9 fL (ref 80.0–100.0)
Monocytes Absolute: 0.8 10*3/uL (ref 0.1–1.0)
Monocytes Relative: 15 %
Neutro Abs: 3.1 10*3/uL (ref 1.7–7.7)
Neutrophils Relative %: 58 %
Platelets: 147 10*3/uL — ABNORMAL LOW (ref 150–400)
RBC: 3.95 MIL/uL — ABNORMAL LOW (ref 4.22–5.81)
RDW: 14.3 % (ref 11.5–15.5)
WBC: 5.5 10*3/uL (ref 4.0–10.5)
nRBC: 0 % (ref 0.0–0.2)

## 2020-10-05 LAB — BRAIN NATRIURETIC PEPTIDE: B Natriuretic Peptide: 441.9 pg/mL — ABNORMAL HIGH (ref 0.0–100.0)

## 2020-10-05 LAB — COMPREHENSIVE METABOLIC PANEL
ALT: 17 U/L (ref 0–44)
AST: 20 U/L (ref 15–41)
Albumin: 3.6 g/dL (ref 3.5–5.0)
Alkaline Phosphatase: 78 U/L (ref 38–126)
Anion gap: 10 (ref 5–15)
BUN: 48 mg/dL — ABNORMAL HIGH (ref 6–20)
CO2: 19 mmol/L — ABNORMAL LOW (ref 22–32)
Calcium: 8.6 mg/dL — ABNORMAL LOW (ref 8.9–10.3)
Chloride: 111 mmol/L (ref 98–111)
Creatinine, Ser: 5.26 mg/dL — ABNORMAL HIGH (ref 0.61–1.24)
GFR, Estimated: 12 mL/min — ABNORMAL LOW (ref >60)
Glucose, Bld: 101 mg/dL — ABNORMAL HIGH (ref 70–99)
Potassium: 4.7 mmol/L (ref 3.5–5.1)
Sodium: 140 mmol/L (ref 135–145)
Total Bilirubin: 0.8 mg/dL (ref 0.3–1.2)
Total Protein: 6.7 g/dL (ref 6.5–8.1)

## 2020-10-05 LAB — RESP PANEL BY RT-PCR (FLU A&B, COVID) ARPGX2
Influenza A by PCR: NEGATIVE
Influenza B by PCR: NEGATIVE
SARS Coronavirus 2 by RT PCR: NEGATIVE

## 2020-10-05 LAB — TROPONIN I (HIGH SENSITIVITY): Troponin I (High Sensitivity): 40 ng/L — ABNORMAL HIGH (ref <18)

## 2020-10-05 MED ORDER — FUROSEMIDE 10 MG/ML IJ SOLN
80.0000 mg | Freq: Once | INTRAMUSCULAR | Status: AC
  Administered 2020-10-05: 80 mg via INTRAVENOUS
  Filled 2020-10-05: qty 8

## 2020-10-05 MED ORDER — HYDRALAZINE HCL 20 MG/ML IJ SOLN
10.0000 mg | Freq: Once | INTRAMUSCULAR | Status: AC
  Administered 2020-10-05: 10 mg via INTRAVENOUS
  Filled 2020-10-05: qty 1

## 2020-10-05 MED ORDER — DILTIAZEM HCL 25 MG/5ML IV SOLN
10.0000 mg | Freq: Once | INTRAVENOUS | Status: AC
  Administered 2020-10-05: 10 mg via INTRAVENOUS
  Filled 2020-10-05: qty 5

## 2020-10-05 NOTE — ED Triage Notes (Signed)
Patient has a history of CHF, seen by PCP today sent for evaluation of increasing shortness of breath on exertion and worsening of shortness of breath at night, patient states he has to sleep sitting up.

## 2020-10-05 NOTE — ED Provider Notes (Signed)
[facility] EMERGENCY DEPT Provider Note   CSN: [id number] Arrival date & time: 10/05/20  1718     History Chief Complaint  Patient presents with  . Shortness of Breath    Ian Gregory. is a 55 y.o. male.  HPI 55 year old male presents with elevated blood pressure and dyspnea.  He states he has been feeling short of breath on exertion and is unable to lay flat when he sleeps for about a month or so.  He basically has to sleep sitting up.  He has noticed increasing leg swelling.  Some chest tightness at night.  No cough or fever.  He reports compliance with his diuretic and blood pressure medicine.  Saw his PCP today who told him to go to the ER due to the hypertension and likely CHF.  Past Medical History:  Diagnosis Date  . CHF (congestive heart failure) ([place])   . CKD (chronic kidney disease) stage 3, GFR 30-59 ml/min (HCC)   . HTN (hypertension)   . Sarcoidosis     Patient Active Problem List   Diagnosis Date Noted  . ARF (acute renal failure) ([place]) 10/05/2020  . SOB (shortness of breath)   . Acute on chronic systolic CHF (congestive heart failure) ([place])   . Acute on chronic diastolic CHF (congestive heart failure) ([place])   . CAP (community acquired pneumonia) 05/05/2017  . Hypertensive crisis 05/05/2017  . CKD (chronic kidney disease) stage 3, GFR 30-59 ml/min (HCC) 05/05/2017  . Acute CHF (congestive heart failure) ([place]) 05/05/2017  . Hypertensive crisis without congestive heart failure   . Chest cold   . Hypertensive emergency   . Thrombocytopenia ([place]) 05/13/2015  . Accelerated hypertension 05/12/2015  . Acute renal failure superimposed on stage 3 chronic kidney disease ([place]) 05/12/2015  . Anemia of chronic renal failure, stage 3 (moderate) ([place]) 05/12/2015  . Acute respiratory failure with hypoxemia ([place]) 05/12/2015  . SIRS (systemic inflammatory response syndrome) ([place]) 05/12/2015  . Lobar pneumonia, unspecified organism ([place]) 05/12/2015  .  Pulmonary sarcoidosis/clinical dx only  08/07/2013    History reviewed. No pertinent surgical history.     Family History  Problem Relation Age of Onset  . Hypertension Mother   . Kidney failure Mother   . Diabetes Father   . Hypertension Father     Social History   Tobacco Use  . Smoking status: Never Smoker  . Smokeless tobacco: Never Used  Vaping Use  . Vaping Use: Never used  Substance Use Topics  . Alcohol use: Yes    Comment: beer on wkends  . Drug use: No    Home Medications Prior to Admission medications   Medication Sig Start Date End Date Taking? Authorizing Provider  carvedilol (COREG) 25 MG tablet Take 1 tablet (25 mg total) by mouth 2 (two) times daily with a meal. 05/08/17   Barton Dubois, MD  furosemide (LASIX) 40 MG tablet Take 1 tablet (40 mg total) by mouth 2 (two) times daily. 05/08/17 05/08/18  Barton Dubois, MD  isosorbide-hydrALAZINE (BIDIL) 20-37.5 MG tablet Take 1 tablet by mouth 2 (two) times daily. 05/08/17   Barton Dubois, MD  lisinopril-hydrochlorothiazide (PRINZIDE,ZESTORETIC) 20-25 MG per tablet Take 1 tablet by mouth daily.  06/27/13  [provider]    Allergies    Amlodipine  Review of Systems   Review of Systems  Constitutional: Negative for fever.  Respiratory: Positive for chest tightness and shortness of breath. Negative for cough.   Cardiovascular: Positive for leg swelling. Negative for  chest pain.  All other systems reviewed and are negative.   Physical Exam Updated Vital Signs BP (!) 217/145 (BP Location: Left Arm)   Pulse 77   Temp 98.6 F (37 C)   Resp (!) 23   Ht 6\' 3"  (1.905 m)   Wt 125.6 kg   SpO2 100%   BMI 34.62 kg/m   Physical Exam Vitals and nursing note reviewed.  Constitutional:      Appearance: He is well-developed. He is not ill-appearing or diaphoretic.  HENT:     Head: Normocephalic and atraumatic.     Right Ear: External ear normal.     Left Ear: External ear normal.     Nose: Nose  normal.  Eyes:     General:        Right eye: No discharge.        Left eye: No discharge.  Cardiovascular:     Rate and Rhythm: Normal rate and regular rhythm.     Heart sounds: Normal heart sounds.  Pulmonary:     Effort: Pulmonary effort is normal. No tachypnea or accessory muscle usage.     Breath sounds: Examination of the left-lower field reveals decreased breath sounds. Decreased breath sounds present.  Abdominal:     Palpations: Abdomen is soft.     Tenderness: There is no abdominal tenderness.  Musculoskeletal:     Cervical back: Neck supple.     Right lower leg: Edema present.     Left lower leg: Edema present.  Skin:    General: Skin is warm and dry.  Neurological:     Mental Status: He is alert.  Psychiatric:        Mood and Affect: Mood is not anxious.     ED Results / Procedures / Treatments   Labs (all labs ordered are listed, but only abnormal results are displayed) Labs Reviewed  COMPREHENSIVE METABOLIC PANEL - Abnormal; Notable for the following components:      Result Value   CO2 19 (*)    Glucose, Bld 101 (*)    BUN 48 (*)    Creatinine, Ser 5.26 (*)    Calcium 8.6 (*)    GFR, Estimated 12 (*)    All other components within normal limits  BRAIN NATRIURETIC PEPTIDE - Abnormal; Notable for the following components:   B Natriuretic Peptide 441.9 (*)    All other components within normal limits  CBC WITH DIFFERENTIAL/PLATELET - Abnormal; Notable for the following components:   RBC 3.95 (*)    Hemoglobin 12.1 (*)    HCT 37.1 (*)    Platelets 147 (*)    All other components within normal limits  TROPONIN I (HIGH SENSITIVITY) - Abnormal; Notable for the following components:   Troponin I (High Sensitivity) 40 (*)    All other components within normal limits  RESP PANEL BY RT-PCR (FLU A&B, COVID) [id number]  TROPONIN I (HIGH SENSITIVITY)    EKG EKG Interpretation  Date/Time:  Wednesday Oct 05 2020 20:29:01 EDT Ventricular Rate:  79 PR  Interval:  212 QRS Duration: 164 QT Interval:  449 QTC Calculation: 515 R Axis:   75 Text Interpretation: Sinus rhythm Prolonged PR interval Probable left atrial enlargement Right bundle branch block Confirmed by Sherwood Gambler [phone]) on 10/05/2020 8:39:39 PM   Radiology DG Chest 2 View  Result Date: 10/05/2020 CLINICAL DATA:  Dyspnea and peripheral swelling EXAM: CHEST - 2 VIEW COMPARISON:  05/08/2017 FINDINGS: Cardiac shadow is at the upper limits of normal  in size. Mild vascular congestion is seen. Mild left basilar atelectasis is noted. No bony abnormality is seen. IMPRESSION: Mild vascular congestion with left basilar atelectasis. Electronically Signed   By: Inez Catalina M.D.   On: 10/05/2020 19:37    Procedures .Critical Care Performed by: Sherwood Gambler, MD Authorized by: Sherwood Gambler, MD   Critical care provider statement:    Critical care time (minutes):  45   Critical care time was exclusive of:  Separately billable procedures and treating other patients   Critical care was necessary to treat or prevent imminent or life-threatening deterioration of the following conditions:  Renal failure and respiratory failure   Critical care was time spent personally by me on the following activities:  Discussions with consultants, evaluation of patient's response to treatment, examination of patient, ordering and performing treatments and interventions, ordering and review of laboratory studies, ordering and review of radiographic studies, pulse oximetry, re-evaluation of patient's condition, obtaining history from patient or surrogate and review of old charts     Medications Ordered in ED Medications  furosemide (LASIX) injection 80 mg (80 mg Intravenous Given 10/05/20 2117)  hydrALAZINE (APRESOLINE) injection 10 mg (10 mg Intravenous Given 10/05/20 2113)  diltiazem (CARDIZEM) injection 10 mg (10 mg Intravenous Given 10/05/20 2233)    ED Course  I have reviewed the triage vital  signs and the nursing notes.  Pertinent labs & imaging results that were available during my care of the patient were reviewed by me and considered in my medical decision making (see chart for details).    MDM Rules/Calculators/A&P                          Patient appears to have CHF in addition to worsening renal function.  His last creatinine was around 3.5 in December.  He is not in distress but he is quite hypertensive.  Overall this is probably underlying from his blood pressure.  We will give Lasix and have also given a dose of hydralazine.  Discussed with [place], who accepts for admission to [facility].  He asked for 10 mg diltiazem as well.  Final Clinical Impression(s) / ED Diagnoses Final diagnoses:  Acute kidney injury superimposed on chronic kidney disease ([place])  Acute congestive heart failure, unspecified heart failure type [facility])    Rx / DC Orders ED Discharge Orders    None       Sherwood Gambler, MD 10/05/20 2325

## 2020-10-06 ENCOUNTER — Encounter (HOSPITAL_COMMUNITY): Payer: Self-pay | Admitting: Family Medicine

## 2020-10-06 ENCOUNTER — Inpatient Hospital Stay (HOSPITAL_COMMUNITY): Payer: Commercial Managed Care - PPO

## 2020-10-06 DIAGNOSIS — I5033 Acute on chronic diastolic (congestive) heart failure: Secondary | ICD-10-CM

## 2020-10-06 DIAGNOSIS — I10 Essential (primary) hypertension: Secondary | ICD-10-CM

## 2020-10-06 DIAGNOSIS — R0609 Other forms of dyspnea: Secondary | ICD-10-CM

## 2020-10-06 DIAGNOSIS — E785 Hyperlipidemia, unspecified: Secondary | ICD-10-CM | POA: Diagnosis present

## 2020-10-06 DIAGNOSIS — N2581 Secondary hyperparathyroidism of renal origin: Secondary | ICD-10-CM | POA: Diagnosis present

## 2020-10-06 DIAGNOSIS — I130 Hypertensive heart and chronic kidney disease with heart failure and stage 1 through stage 4 chronic kidney disease, or unspecified chronic kidney disease: Secondary | ICD-10-CM | POA: Diagnosis present

## 2020-10-06 DIAGNOSIS — Z8249 Family history of ischemic heart disease and other diseases of the circulatory system: Secondary | ICD-10-CM | POA: Diagnosis not present

## 2020-10-06 DIAGNOSIS — Z833 Family history of diabetes mellitus: Secondary | ICD-10-CM | POA: Diagnosis not present

## 2020-10-06 DIAGNOSIS — Z841 Family history of disorders of kidney and ureter: Secondary | ICD-10-CM | POA: Diagnosis not present

## 2020-10-06 DIAGNOSIS — N179 Acute kidney failure, unspecified: Secondary | ICD-10-CM | POA: Diagnosis present

## 2020-10-06 DIAGNOSIS — N184 Chronic kidney disease, stage 4 (severe): Secondary | ICD-10-CM | POA: Diagnosis present

## 2020-10-06 DIAGNOSIS — D860 Sarcoidosis of lung: Secondary | ICD-10-CM | POA: Diagnosis present

## 2020-10-06 DIAGNOSIS — R823 Hemoglobinuria: Secondary | ICD-10-CM | POA: Diagnosis present

## 2020-10-06 DIAGNOSIS — Z20822 Contact with and (suspected) exposure to covid-19: Secondary | ICD-10-CM | POA: Diagnosis present

## 2020-10-06 DIAGNOSIS — N400 Enlarged prostate without lower urinary tract symptoms: Secondary | ICD-10-CM | POA: Diagnosis present

## 2020-10-06 DIAGNOSIS — I161 Hypertensive emergency: Secondary | ICD-10-CM

## 2020-10-06 DIAGNOSIS — R319 Hematuria, unspecified: Secondary | ICD-10-CM | POA: Diagnosis present

## 2020-10-06 DIAGNOSIS — E872 Acidosis: Secondary | ICD-10-CM | POA: Diagnosis present

## 2020-10-06 DIAGNOSIS — I129 Hypertensive chronic kidney disease with stage 1 through stage 4 chronic kidney disease, or unspecified chronic kidney disease: Secondary | ICD-10-CM | POA: Diagnosis not present

## 2020-10-06 DIAGNOSIS — D631 Anemia in chronic kidney disease: Secondary | ICD-10-CM | POA: Diagnosis present

## 2020-10-06 DIAGNOSIS — Z6833 Body mass index (BMI) 33.0-33.9, adult: Secondary | ICD-10-CM | POA: Diagnosis not present

## 2020-10-06 DIAGNOSIS — E669 Obesity, unspecified: Secondary | ICD-10-CM | POA: Diagnosis present

## 2020-10-06 DIAGNOSIS — R8281 Pyuria: Secondary | ICD-10-CM | POA: Diagnosis present

## 2020-10-06 DIAGNOSIS — Z79899 Other long term (current) drug therapy: Secondary | ICD-10-CM | POA: Diagnosis not present

## 2020-10-06 LAB — BASIC METABOLIC PANEL
Anion gap: 9 (ref 5–15)
BUN: 45 mg/dL — ABNORMAL HIGH (ref 6–20)
CO2: 21 mmol/L — ABNORMAL LOW (ref 22–32)
Calcium: 9.3 mg/dL (ref 8.9–10.3)
Chloride: 111 mmol/L (ref 98–111)
Creatinine, Ser: 5.19 mg/dL — ABNORMAL HIGH (ref 0.61–1.24)
GFR, Estimated: 12 mL/min — ABNORMAL LOW (ref >60)
Glucose, Bld: 102 mg/dL — ABNORMAL HIGH (ref 70–99)
Potassium: 4.2 mmol/L (ref 3.5–5.1)
Sodium: 141 mmol/L (ref 135–145)

## 2020-10-06 LAB — URINALYSIS, ROUTINE W REFLEX MICROSCOPIC
Bacteria, UA: NONE SEEN
Bilirubin Urine: NEGATIVE
Glucose, UA: 50 mg/dL — AB
Ketones, ur: NEGATIVE mg/dL
Nitrite: NEGATIVE
Protein, ur: >=300 mg/dL — AB
Specific Gravity, Urine: 1.012 (ref 1.005–1.030)
WBC, UA: >50 WBC/hpf — ABNORMAL HIGH (ref 0–5)
pH: 6 (ref 5.0–8.0)

## 2020-10-06 LAB — ECHOCARDIOGRAM COMPLETE
AR max vel: 2.75 cm2
AV Area VTI: 2.63 cm2
AV Area mean vel: 2.56 cm2
AV Mean grad: 5 mmHg
AV Peak grad: 9.5 mmHg
Ao pk vel: 1.54 m/s
Area-P 1/2: 3.17 cm2
Height: 75 in
S' Lateral: 2.9 cm
Weight: 4224 oz

## 2020-10-06 LAB — CBC
HCT: 37.7 % — ABNORMAL LOW (ref 39.0–52.0)
Hemoglobin: 12.2 g/dL — ABNORMAL LOW (ref 13.0–17.0)
MCH: 30.7 pg (ref 26.0–34.0)
MCHC: 32.4 g/dL (ref 30.0–36.0)
MCV: 95 fL (ref 80.0–100.0)
Platelets: 156 10*3/uL (ref 150–400)
RBC: 3.97 MIL/uL — ABNORMAL LOW (ref 4.22–5.81)
RDW: 14.1 % (ref 11.5–15.5)
WBC: 5.5 10*3/uL (ref 4.0–10.5)
nRBC: 0 % (ref 0.0–0.2)

## 2020-10-06 LAB — PROTIME-INR
INR: 1.1 (ref 0.8–1.2)
Prothrombin Time: 13.8 seconds (ref 11.4–15.2)

## 2020-10-06 LAB — APTT: aPTT: 40 seconds — ABNORMAL HIGH (ref 24–36)

## 2020-10-06 LAB — HIV ANTIBODY (ROUTINE TESTING W REFLEX): HIV Screen 4th Generation wRfx: NONREACTIVE

## 2020-10-06 LAB — OSMOLALITY: Osmolality: 307 mOsm/kg — ABNORMAL HIGH (ref 275–295)

## 2020-10-06 LAB — TROPONIN I (HIGH SENSITIVITY): Troponin I (High Sensitivity): 37 ng/L — ABNORMAL HIGH (ref <18)

## 2020-10-06 MED ORDER — LABETALOL HCL 5 MG/ML IV SOLN
10.0000 mg | INTRAVENOUS | Status: DC | PRN
  Administered 2020-10-06 – 2020-10-11 (×7): 10 mg via INTRAVENOUS
  Filled 2020-10-06 (×6): qty 4

## 2020-10-06 MED ORDER — CARVEDILOL 25 MG PO TABS: 25.0000 mg | ORAL_TABLET | Freq: Two times a day (BID) | ORAL | Status: DC

## 2020-10-06 MED ORDER — FUROSEMIDE 10 MG/ML IJ SOLN
40.0000 mg | Freq: Two times a day (BID) | INTRAMUSCULAR | Status: DC
  Administered 2020-10-06 – 2020-10-07 (×3): 40 mg via INTRAVENOUS
  Filled 2020-10-06 (×3): qty 4

## 2020-10-06 MED ORDER — FUROSEMIDE 40 MG PO TABS
40.0000 mg | ORAL_TABLET | Freq: Two times a day (BID) | ORAL | Status: DC
  Administered 2020-10-06: 40 mg via ORAL
  Filled 2020-10-06: qty 1

## 2020-10-06 MED ORDER — DILTIAZEM HCL-DEXTROSE 125-5 MG/125ML-% IV SOLN (PREMIX)
5.0000 mg/h | INTRAVENOUS | Status: DC
  Administered 2020-10-06: 5 mg/h via INTRAVENOUS
  Filled 2020-10-06: qty 125

## 2020-10-06 MED ORDER — ISOSORB DINITRATE-HYDRALAZINE 20-37.5 MG PO TABS
1.0000 | ORAL_TABLET | Freq: Two times a day (BID) | ORAL | Status: DC
  Administered 2020-10-06 – 2020-10-08 (×6): 1 via ORAL
  Filled 2020-10-06 (×6): qty 1

## 2020-10-06 MED ORDER — HYDRALAZINE HCL 20 MG/ML IJ SOLN
10.0000 mg | INTRAMUSCULAR | Status: DC | PRN
  Administered 2020-10-06 – 2020-10-11 (×9): 10 mg via INTRAVENOUS
  Filled 2020-10-06 (×9): qty 1

## 2020-10-06 MED ORDER — ONDANSETRON HCL 4 MG/2ML IJ SOLN: 4.0000 mg | Freq: Four times a day (QID) | INTRAMUSCULAR | Status: DC | PRN

## 2020-10-06 MED ORDER — CARVEDILOL 25 MG PO TABS
25.0000 mg | ORAL_TABLET | Freq: Two times a day (BID) | ORAL | Status: DC
  Administered 2020-10-06 – 2020-10-11 (×11): 25 mg via ORAL
  Filled 2020-10-06 (×11): qty 1

## 2020-10-06 MED ORDER — CLONIDINE HCL 0.1 MG PO TABS
0.1000 mg | ORAL_TABLET | Freq: Three times a day (TID) | ORAL | Status: DC
  Administered 2020-10-06 – 2020-10-07 (×5): 0.1 mg via ORAL
  Filled 2020-10-06 (×5): qty 1

## 2020-10-06 MED ORDER — ISOSORB DINITRATE-HYDRALAZINE 20-37.5 MG PO TABS: 1.0000 | ORAL_TABLET | Freq: Two times a day (BID) | ORAL | Status: DC

## 2020-10-06 MED ORDER — HEPARIN SODIUM (PORCINE) 5000 UNIT/ML IJ SOLN
5000.0000 [IU] | Freq: Three times a day (TID) | INTRAMUSCULAR | Status: DC
  Administered 2020-10-06 – 2020-10-11 (×15): 5000 [IU] via SUBCUTANEOUS
  Filled 2020-10-06 (×16): qty 1

## 2020-10-06 MED ORDER — ACETAMINOPHEN 650 MG RE SUPP: 650.0000 mg | Freq: Four times a day (QID) | RECTAL | Status: AC | PRN

## 2020-10-06 MED ORDER — ONDANSETRON HCL 4 MG PO TABS
4.0000 mg | ORAL_TABLET | Freq: Four times a day (QID) | ORAL | Status: DC | PRN
Start: 2020-10-06 — End: 2020-10-12

## 2020-10-06 MED ORDER — ACETAMINOPHEN 325 MG PO TABS
650.0000 mg | ORAL_TABLET | Freq: Four times a day (QID) | ORAL | Status: AC | PRN
  Administered 2020-10-06 – 2020-10-08 (×2): 650 mg via ORAL
  Filled 2020-10-06 (×2): qty 2

## 2020-10-06 NOTE — Consult Note (Signed)
[facility] Consult Note     Date: 10/06/2020                  Patient Name:  Ian Gregory.  MRN: [id number]  DOB: [birth year]  Age / Sex: 55 y.o., male         PCP: [facility] [place]                 Service Requesting Consult: [facility]                  Reason for Consult: Acute on Chronic Kidney Injury            Chief Complaint: Shortness of Breath  HPI:   Mr. Brensinger is a 55 y/o M with a PMHx of HFpEF, CKD Stg 4, HTN, HLD, Sarcoidosis who was sent to the ED by his PCP for shortness of breath with concern for severe symptomatic hypertension.  Mr. Swigert endorses 1 month of worsening shortness of breath when walking short distances as well as shortness of breath when lying flat.  He endorses adherence to his home medications including his Lasix.  He denies any decrease in urinary frequency.  Does note some chest tightness at times.  He felt as though the symptoms were worsening and persisting so he went to his PCP yesterday who sent him to the ED when they found that his blood pressure was elevated.  He also notes 5 days of blood in his urine, denies any pain with urination or discharge. Denies any back pain. Denies fevers, chills. Does note being told he snores in the past, he does wake up not feeling rested and with headaches. Denies having sleep study in the past for OSA.   Has not followed with most of his care team over the past year due to [place]. Has not seen his PCP, nephrologist, or pulmonologist in a few months.   Past Medical History:  Diagnosis Date  . CHF (congestive heart failure) ([place])   . CKD (chronic kidney disease) stage 3, GFR 30-59 ml/min ([facility])   . HTN (hypertension)   . Sarcoidosis     History reviewed. No pertinent surgical history.  Family History  Problem Relation Age of Onset  . Hypertension Mother   . Kidney failure Mother   . Diabetes Father   . Hypertension Father    Social History:   reports that he has never smoked. He has never used smokeless tobacco. He reports current alcohol use. He reports that he does not use drugs.  Allergies:  Allergies  Allergen Reactions  . Amlodipine Swelling    Medications Prior to Admission  Medication Sig Dispense Refill  . atorvastatin (LIPITOR) 10 MG tablet Take 10 mg by mouth daily.    . carvedilol (COREG) 25 MG tablet Take 1 tablet (25 mg total) by mouth 2 (two) times daily with a meal. 60 tablet 1  . Cholecalciferol (VITAMIN D3) 50 MCG (2000 UT) capsule Take 2,000 Units by mouth daily.    . cloNIDine (CATAPRES) 0.1 MG tablet Take 0.1 mg by mouth 3 (three) times daily.    . fluticasone (FLONASE) 50 MCG/ACT nasal spray Place 1 spray into both nostrils daily as needed for allergies.    . furosemide (LASIX) 40 MG tablet Take 1 tablet (40 mg total) by mouth 2 (two) times daily. 60 tablet 1  . hydrALAZINE (APRESOLINE) 25 MG tablet Take 25 mg by mouth 3 (three) times daily.    Marland Kitchen  isosorbide mononitrate (IMDUR) 30 MG 24 hr tablet Take 30 mg by mouth daily.    . isosorbide-hydrALAZINE (BIDIL) 20-37.5 MG tablet Take 1 tablet by mouth 2 (two) times daily. (Patient not taking: Reported on 10/06/2020) 60 tablet 1    Results for orders placed or performed during the hospital encounter of 10/05/20 (from the past 48 hour(s))  Comprehensive metabolic panel     Status: Abnormal   Collection Time: 10/05/20  7:49 PM  Result Value Ref Range   Sodium 140 135 - 145 mmol/L   Potassium 4.7 3.5 - 5.1 mmol/L   Chloride 111 98 - 111 mmol/L   CO2 19 (L) 22 - 32 mmol/L   Glucose, Bld 101 (H) 70 - 99 mg/dL    Comment: Glucose reference range applies only to samples taken after fasting for at least 8 hours.   BUN 48 (H) 6 - 20 mg/dL   Creatinine, Ser 5.26 (H) 0.61 - 1.24 mg/dL   Calcium 8.6 (L) 8.9 - 10.3 mg/dL   Total Protein 6.7 6.5 - 8.1 g/dL   Albumin 3.6 3.5 - 5.0 g/dL   AST 20 15 - 41 U/L   ALT 17 0 - 44 U/L   Alkaline Phosphatase 78 38 - 126 U/L    Total Bilirubin 0.8 0.3 - 1.2 mg/dL   GFR, Estimated 12 (L) >60 mL/min    Comment: (NOTE) Calculated using the CKD-EPI Creatinine Equation (2021)    Anion gap 10 5 - 15    Comment: Performed at [facility], [address], [place], [place] [address]  Brain natriuretic peptide     Status: Abnormal   Collection Time: 10/05/20  7:49 PM  Result Value Ref Range   B Natriuretic Peptide 441.9 (H) 0.0 - 100.0 pg/mL    Comment: Performed at [facility], [place], [place] [address]  Troponin I (High Sensitivity)     Status: Abnormal   Collection Time: 10/05/20  7:49 PM  Result Value Ref Range   Troponin I (High Sensitivity) 40 (H) <18 ng/L    Comment: (NOTE) Elevated high sensitivity troponin I (hsTnI) values and significant  changes across serial measurements may suggest ACS but many other  chronic and acute conditions are known to elevate hsTnI results.  Refer to the "Links" section for chest pain algorithms and additional  guidance. Performed at [facility], [address] [place], [place] [address]   CBC with Differential     Status: Abnormal   Collection Time: 10/05/20  7:49 PM  Result Value Ref Range   WBC 5.5 4.0 - 10.5 K/uL   RBC 3.95 (L) 4.22 - 5.81 MIL/uL   Hemoglobin 12.1 (L) 13.0 - 17.0 g/dL   HCT 37.1 (L) 39.0 - 52.0 %   MCV 93.9 80.0 - 100.0 fL   MCH 30.6 26.0 - 34.0 pg   MCHC 32.6 30.0 - 36.0 g/dL   RDW 14.3 11.5 - 15.5 %   Platelets 147 (L) 150 - 400 K/uL   nRBC 0.0 0.0 - 0.2 %   Neutrophils Relative % 58 %   Neutro Abs 3.1 1.7 - 7.7 K/uL   Lymphocytes Relative 25 %   Lymphs Abs 1.4 0.7 - 4.0 K/uL   Monocytes Relative 15 %   Monocytes Absolute 0.8 0.1 - 1.0 K/uL   Eosinophils Relative 2 %   Eosinophils Absolute 0.1 0.0 - 0.5 K/uL   Basophils Relative 0 %   Basophils Absolute 0.0 0.0 - 0.1  K/uL   Immature Granulocytes 0 %   Abs Immature Granulocytes 0.01 0.00 - 0.07 K/uL    Comment:  Performed at [facility], [address], [place], [place] [address]  Resp Panel by RT-PCR (Flu A&B, Covid) Nasopharyngeal Swab     Status: None   Collection Time: 10/05/20  9:24 PM   Specimen: Nasopharyngeal Swab; Nasopharyngeal(NP) swabs in vial transport medium  Result Value Ref Range   SARS Coronavirus 2 by RT PCR NEGATIVE NEGATIVE    Comment: (NOTE) SARS-CoV-2 target nucleic acids are NOT DETECTED.  The SARS-CoV-2 RNA is generally detectable in upper respiratory specimens during the acute phase of infection. The lowest concentration of SARS-CoV-2 viral copies this assay can detect is 138 copies/mL. A negative result does not preclude SARS-Cov-2 infection and should not be used as the sole basis for treatment or other patient management decisions. A negative result may occur with  improper specimen collection/handling, submission of specimen other than nasopharyngeal swab, presence of viral mutation(s) within the areas targeted by this assay, and inadequate number of viral copies(<138 copies/mL). A negative result must be combined with clinical observations, patient history, and epidemiological information. The expected result is Negative.  Fact Sheet for Patients:  EntrepreneurPulse.com.au  Fact Sheet for Healthcare Providers:  IncredibleEmployment.be  This test is no t yet approved or cleared by the [place] FDA and  has been authorized for detection and/or diagnosis of SARS-CoV-2 by FDA under an Emergency Use Authorization (EUA). This EUA will remain  in effect (meaning this test can be used) for the duration of the COVID-19 declaration under Section 564(b)(1) of the Act, 21 U.S.C.section 360bbb-3(b)(1), unless the authorization is terminated  or revoked sooner.       Influenza A by PCR NEGATIVE NEGATIVE   Influenza B by PCR NEGATIVE NEGATIVE    Comment: (NOTE) The Xpert Xpress SARS-CoV-2/FLU/RSV plus assay  is intended as an aid in the diagnosis of influenza from Nasopharyngeal swab specimens and should not be used as a sole basis for treatment. Nasal washings and aspirates are unacceptable for Xpert Xpress SARS-CoV-2/FLU/RSV testing.  Fact Sheet for Patients: EntrepreneurPulse.com.au  Fact Sheet for Healthcare Providers: IncredibleEmployment.be  This test is not yet approved or cleared by the [place] FDA and has been authorized for detection and/or diagnosis of SARS-CoV-2 by FDA under an Emergency Use Authorization (EUA). This EUA will remain in effect (meaning this test can be used) for the duration of the COVID-19 declaration under Section 564(b)(1) of the Act, 21 U.S.C. section 360bbb-3(b)(1), unless the authorization is terminated or revoked.  Performed at [facility], [place], [place] [address]   Troponin I (High Sensitivity)     Status: Abnormal   Collection Time: 10/06/20 12:57 AM  Result Value Ref Range   Troponin I (High Sensitivity) 37 (H) <18 ng/L    Comment: (NOTE) Elevated high sensitivity troponin I (hsTnI) values and significant  changes across serial measurements may suggest ACS but many other  chronic and acute conditions are known to elevate hsTnI results.  Refer to the "Links" section for chest pain algorithms and additional  guidance. Performed at [facility], [address] [place],  [address]   HIV Antibody (routine testing w rflx)     Status: None   Collection Time: 10/06/20  3:36 AM  Result Value Ref Range   HIV Screen 4th Generation wRfx Non Reactive Non Reactive    Comment: Performed at [facility], [place]  [address] [place], [place] [address]  Basic metabolic panel     Status: Abnormal   Collection Time: 10/06/20  3:36 AM  Result Value Ref Range   Sodium 141 135 - 145 mmol/L   Potassium 4.2 3.5 - 5.1 mmol/L   Chloride 111 98 - 111  mmol/L   CO2 21 (L) 22 - 32 mmol/L   Glucose, Bld 102 (H) 70 - 99 mg/dL    Comment: Glucose reference range applies only to samples taken after fasting for at least 8 hours.   BUN 45 (H) 6 - 20 mg/dL   Creatinine, Ser 5.19 (H) 0.61 - 1.24 mg/dL   Calcium 9.3 8.9 - 10.3 mg/dL   GFR, Estimated 12 (L) >60 mL/min    Comment: (NOTE) Calculated using the CKD-EPI Creatinine Equation (2021)    Anion gap 9 5 - 15    Comment: Performed at [facility] [address], [place], [place] [address]  CBC     Status: Abnormal   Collection Time: 10/06/20  3:36 AM  Result Value Ref Range   WBC 5.5 4.0 - 10.5 K/uL   RBC 3.97 (L) 4.22 - 5.81 MIL/uL   Hemoglobin 12.2 (L) 13.0 - 17.0 g/dL   HCT 37.7 (L) 39.0 - 52.0 %   MCV 95.0 80.0 - 100.0 fL   MCH 30.7 26.0 - 34.0 pg   MCHC 32.4 30.0 - 36.0 g/dL   RDW 14.1 11.5 - 15.5 %   Platelets 156 150 - 400 K/uL   nRBC 0.0 0.0 - 0.2 %    Comment: Performed at [facility], [place] [address] [place], [place] [address]  Protime-INR     Status: None   Collection Time: 10/06/20  3:47 AM  Result Value Ref Range   Prothrombin Time 13.8 11.4 - 15.2 seconds   INR 1.1 0.8 - 1.2    Comment: (NOTE) INR goal varies based on device and disease states. Performed at [facility], [place] [address] [place], [place] [address]   APTT     Status: Abnormal   Collection Time: 10/06/20  3:47 AM  Result Value Ref Range   aPTT 40 (H) 24 - 36 seconds    Comment:        IF BASELINE aPTT IS ELEVATED, SUGGEST PATIENT RISK ASSESSMENT BE USED TO DETERMINE APPROPRIATE ANTICOAGULANT THERAPY. Performed at [facility], [place] [address] [place], [place] [address]   Osmolality     Status: Abnormal   Collection Time: 10/06/20  3:47 AM  Result Value Ref Range   Osmolality 307 (H) 275 - 295 mOsm/kg    Comment: Performed at [facility], [place] [address], [place], [place] [address]   DG Chest 2 View  Result Date: 10/05/2020 CLINICAL DATA:  Dyspnea and  peripheral swelling EXAM: CHEST - 2 VIEW COMPARISON:  05/08/2017 FINDINGS: Cardiac shadow is at the upper limits of normal in size. Mild vascular congestion is seen. Mild left basilar atelectasis is noted. No bony abnormality is seen. IMPRESSION: Mild vascular congestion with left basilar atelectasis. Electronically Signed   By: Inez Catalina M.D.   On: 10/05/2020 19:37   Review of systems as per HPI  Blood pressure (!) 159/110, pulse 65, temperature 98.2 F (36.8 C), temperature source Oral, resp. rate 20, height 6\' 3"  (1.905 m), weight 119.7 kg, SpO2 95 %.   Constitutional: well-appearing, in no acute distress HENT: normocephalic atraumatic Eyes: conjunctiva non-erythematous Neck: supple Cardiovascular: regular rate  and rhythm, no m/r/g. S3 heart sound present. Trace lower extremity edema. Pulmonary/Chest: normal work of breathing on room air, lungs clear to auscultation bilaterally Abdominal: soft, non-tender, non-distended MSK: normal bulk and tone Neurological: alert & oriented x 3 Skin: warm and dry Psych: Normal mood and thought process  Assessment/Plan  Non-Oliguric Acute Kidney Injury CKD Stg IV Hx of Renal Sarcoidosis  Baseline creatinine of 2.3~,appears to have advanced progressing CKD.  Primary nephrologist is Dr. Carolin Sicks, patient missed most recent appointment and has not been seen by his nephrologist for the past few months.  Cr of 5.26>5.19 since admission.  Sodium and potassium stable, bicarb low at 19, 21. UA pending. Renal ultrasound pending. Suspect acute worsening of kidney function secondary to severe symptomatic hypertension/cardiorenal in setting of acute on chronic HFpEF. No indication for urgent dialysis at this time, discussed with patient that we will wait to see if kidney function recovers to baseline. Continue to manage hypertension, diurese as needed, and trend kidney function. Would recommend trending electrolytes as well. Patient also with mild metabolic  acidosis, consider starting bicarb tabs if no improvement tomorrow.  -Daily CMP -Trend Mag/Phos -Consider starting bicarb tabs -No need for urgent dialysis at this time  Severe Symptomatic Hypertension Persistent hypertension Blood pressure has improved since admission, continue with treatment per primary team.  Of note patient states he has episodes of snoring, waking up feeling short of breath, and waking up with headaches.  The symptoms longest with his persistent hypertension to have me concerned that he may have some component of OSA.  Would recommend outpatient follow-up for this. -restart home medications of clonidine, lasix, coreg, bidil -if persistent elevation may need to restart dilt drip   Hematuria Patient notes hematuria for the past 5 days.  Denies dysuria, back pain, penile discharge.  Will order UA as well as renal ultrasound to rule out obstructive causes of his AKI/hematuria..Last Renal US from 2016, unremarkable on imaging.  -UA ordered -Renal US ordered  Normocytic Anemia Suspect multifactorial etiology in setting of CKD and anemia of chronic disease. Primary team plan to order anemia panel tomorrow. Management per primary team.   HFpEF Exacerbation Last echo 2018, EF of 55-60%. Echocardiogram today pending.  Patient with dyspnea on exertion as well as orthopnea, consistent with worsening heart failure. BNP of 441, has been elevated in the past as high as 1500. Evidence of LVH on EKG. Suspect exacerbation is secondary to patient's persistent hypertension.  Patient states that his breathing is improved that he has been urinating frequently since his admission. Continue to diurese and trend creatinine.  If large jump in creatinine, would consider stopping diuretics at that time.  Appears hypervolemic on examination -Continue management with IV diuretics/PO diuretics per primary team -Strict I's and O's -Daily weights  Pulmonary sarcoidosis Patient denies following up  with pulmonologist for some time.  Has noticed some cough with yellow phlegm over the past few months.  Possibly related to worsening CHF versus pulmonary sarcoidosis.  Recommend patient follow-up with his pulmonologist on outpatient basis.  HLD Continue statin  Please await attending attestation for recommendations  Sanjuana Letters DO  Internal Medicine Resident PGY-1 [place]  Pager: [phone]

## 2020-10-06 NOTE — Progress Notes (Incomplete)
  Echocardiogram 2D Echocardiogram has been performed.  Cammy Brochure 10/06/2020, 12:39 PM

## 2020-10-06 NOTE — Progress Notes (Signed)
Care started prior to midnight in the emergency room and patient was admitted early this morning after midnight by Dr. Rupert Stacks and I am in current agreement with her assessment and plan.  Additional changes to the plan of care been made accordingly.  The patient is a 55 year old obese African-American male with a past medical history significant for but not limited to hypertension, CKD stage IV, history of diastolic CHF, sarcoidosis, as well as other comorbidities who presented to the emergency room with a chief concern of shortness of breath.  He reports that the shortness of breath and going on for the last month or so is worse with exertion and states that he is not able to lay flat and has to have 3 pillows being propped up to sleep.  He presented to his PCP on 10/05/2020 and was told to go to the ED given his elevated blood pressure.  He reported shortness of breath that has been going on for about a month and states has been difficult for him to obtain an appointment to see his PCP.  He also endorses bilateral lower extremity edema that has been going on for years but acutely worsened.  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  Currently he is being admitted and treated for the following but not limited to  Hypertensive emergency/urgency Accelerated hypertension -Initiated on Diltiazem gtt but will attempt to wean -Given a Dose of IV Lasix 80 mg x1 -Resumed home antihypertensives: Isosorbide hydralazine 20-30 7.5 twice daily, furosemide 40 mg twice daily but will hold and start IV Lasix 40 mg BID, Coreg 25 mg twice daily -Complete echo ordered -Continue to Monitor BP per Protocol  -Last blood pressure was 155/100  Acute on Chronic Diastolic CHF -last echocardiogram was done in 2018 and showed a normal EF of 55 to 60% with grade 1 diastolic dysfunction -Repeat echocardiogram this time -BNP was 441.9 and patient is unable to lie flat  and has dyspnea on exertion along with pitting edema -Start diuresis with IV Lasix and was given IV 80 mg yesterday and will give IV 40 mg twice daily -Strict I's and O's and daily weights -Nephrology consulted for further assistance and evaluation given his renal dysfunction -Attempt to wean off of the diltiazem drip  Acute Kidney Injury on CKD stage IV- suspect secondary to hypertensive emergency Metabolic Acidosis -BUN/creatinine went from 48/5.26 and is now 45/5.19 -Patient has some mild metabolic acidosis with a CO2 of 21, anion gap of 9, chloride level of 111 -Avoid nephrotoxic medications, contrast dyes, hypotension and renally dose medications -Repeat CMP in a.m.  Normocytic Anemia/Anemia of chronic kidney disease -Patient's Hgb/Hct went from 12.1/37.1 -> 12.2/37.7 -Check anemia panel in the a.m. -Continue monitor for signs and symptoms bleeding; include neuro overt bleeding noted  -Repeat CBC in a.m.  History of hematuria and suprapubic abdominal pain- patient urine was negative for hematuria during this hospitalization - If persists with recommend a.m. team to order a CT stone chaser to assess for kidney stones  Will repeat blood work and imaging in the a.m. and follow-up on specialist recommendations and continue monitor patient's clinical response to intervention

## 2020-10-06 NOTE — Progress Notes (Signed)
Heart Failure Navigator Progress Note  Assessed for Heart & Vascular TOC clinic readiness.  Unfortunately at this time the patient does not meet criteria due to CKD IV, SCr <5.0, baseline SCr 2.9-3.35.   Navigator available for education/resources.  Pricilla Holm, RN, BSN Heart Failure Nurse Navigator [phone]

## 2020-10-06 NOTE — Progress Notes (Signed)
   10/06/20 0254  Assess: MEWS Score  Temp 98.5 F (36.9 C)  BP (!) 250/161 (adult regular cuff)  Resp 18  Level of Consciousness Alert  Assess: MEWS Score  MEWS Temp 0  MEWS Systolic 2  MEWS Pulse 0  MEWS RR 0  MEWS LOC 0  MEWS Score 2  MEWS Score Color Yellow  Treat  MEWS Interventions Escalated (See documentation below)  Pain Scale 0-10  Pain Score 0  Take Vital Signs  Increase Vital Sign Frequency  Yellow: Q 2hr X 2 then Q 4hr X 2, if remains yellow, continue Q 4hrs  Escalate  MEWS: Escalate Yellow: discuss with charge nurse/RN and consider discussing with provider and RRT  Notify: Charge Nurse/RN  Name of Charge Nurse/RN Notified Surveyor, quantity  Date Charge Nurse/RN Notified 10/06/20 [phone])  Time Charge Nurse/RN Notified 9038  Notify: Provider  Provider Name/Title Dr. Tobie Poet  Date Provider Notified 10/06/20  Time Provider Notified [phone]  Notification Type Page  Document  Patient Outcome Not stable and remains on department

## 2020-10-06 NOTE — Plan of Care (Signed)
  Problem: Education: Goal: Knowledge of General Education information will improve Description: Including pain rating scale, medication(s)/side effects and non-pharmacologic comfort measures Outcome: Progressing   Problem: Health Behavior/Discharge Planning: Goal: Ability to manage health-related needs will improve Outcome: Progressing   Problem: Clinical Measurements: Goal: Ability to maintain clinical measurements within normal limits will improve Outcome: Progressing Goal: Will remain free from infection Outcome: Progressing Goal: Diagnostic test results will improve Outcome: Progressing Goal: Respiratory complications will improve Outcome: Progressing Goal: Cardiovascular complication will be avoided Outcome: Progressing   Problem: Safety: Goal: Ability to remain free from injury will improve Outcome: Progressing   Problem: Pain Managment: Goal: General experience of comfort will improve Outcome: Progressing   Problem: Skin Integrity: Goal: Risk for impaired skin integrity will decrease Outcome: Progressing   Problem: Elimination: Goal: Will not experience complications related to bowel motility Outcome: Progressing Goal: Will not experience complications related to urinary retention Outcome: Progressing

## 2020-10-06 NOTE — H&P (Signed)
History and Physical   Ian Gregory. FYB:[id number] DOB: [birth year] DOA: 10/05/2020  PCP: [facility] [place]  Outpatient Specialists: None. Patient coming from: [place] emergency department  I have personally briefly reviewed patient's old medical records in [place].  Chief Concern: Shortness of breath  HPI: Ian Gregory. is a 55 y.o. male with medical history significant for hypertension, CKD 4, presents to the med center emergency departmentfor chief concerns of shortness of breath.  He reports that the shortness of breath has been ongoing for the last 1 month, and is worse with exertion.  He had presented to his PCP on 10/05/2020 and he was told to present to the emergency department due to elevated blood pressure.  He reports that the shortness of breath has been ongoing for about a month and it has been difficult for him to obtain an appointment to see his PCP.  He also endorses bilateral lower extremity pitting edema that has been ongoing for 1 year.  At bedside, patient appears comfortable, calm, awake alert x4, and in no acute distress.  He denies chest pain, vision changes, dysuria, dysphagia, diarrhea.  He does endorse suprapubic abdominal pain that is intermittent and worse with deep palpation and intermittent hematuria.  He reports his last episode of hematuria was 2 days ago.  He endorses compliance with his home antihypertensives.  Social History: Patient currently works as a Freight forwarder.  He denies tobacco and recreational drug use.  He infrequently drinks EtOH, approximately 3 beers per week.  Vaccinations: He is vaccinated for COVID-19, 2 doses of Moderna  ROS: Constitutional: no weight change, no fever ENT/Mouth: no sore throat, no rhinorrhea Eyes: no eye pain, no vision changes Cardiovascular: no chest pain, + dyspnea,  + edema, no palpitations Respiratory: no cough, no sputum, no wheezing Gastrointestinal: no  nausea, no vomiting, no diarrhea, no constipation Genitourinary: no urinary incontinence, no dysuria, + hematuria Musculoskeletal: no arthralgias, no myalgias Skin: no skin lesions, no pruritus, Neuro: + weakness, no loss of consciousness, no syncope Psych: no anxiety, no depression, + decrease appetite Heme/Lymph: no bruising, no bleeding  Hospital  course:   Vitals in the emergency department was initially showed a temperature of 98.9, respiration rate of 20, heart rate 70, blood pressure 194/129, SPO2 of 97% on room air.  He is status post diltiazem 10 mg IV once, furosemide 80 mg IV once, hydralazine 10 mg IV at med center.  Upon arrival to [facility], his SBP was 250/161.  Labs in the emergency department was remarkable for sodium level 140, potassium 4.7, chloride 111, bicarb 19, BUN 48, serum creatinine 5.26, nonfasting blood glucose 101, WBC 5.5, hemoglobin 12.1, platelets 147.  EGFR is 12.  COVID PCR/influenza A/influenza B were negative  Assessment/Plan  Principal Problem:   Hypertensive emergency Active Problems:   Accelerated hypertension   Acute renal failure superimposed on stage 3 chronic kidney disease (HCC)   Anemia of chronic renal failure, stage 3 (moderate) (HCC)   ARF (acute renal failure) (HCC)   Hypertensive emergency/urgency Accelerated hypertension - Diltiazem gtt. -Resumed home antihypertensives: Isosorbide hydralazine 20-30 7.5 twice daily, furosemide 40 mg twice daily, Coreg 25 mg twice daily -Complete echo ordered  Acute kidney injury- suspect secondary to hypertensive emergency - Treat as above -Recommend a.m. team to consult nephrology  History of hematuria and suprapubic abdominal pain- patient urine was negative for hematuria during this hospitalization - If persists with recommend a.m. team to order  a CT stone chaser to assess for kidney stones  Chart reviewed.   Echo on 07/07/2016 showed EF of 55 to 60%, no regional wall motion  abnormalities, grade 1 diastolic dysfunction  DVT prophylaxis: Heparin 5000 units daily subcutaneous Code Status: Full code Diet: Heart healthy, renal with fluid restriction Family Communication: None Disposition Plan: Pending clinical course Consults called: No, would recommend a.m. team to consult nephrology Admission status: Inpatient, progressive cardiac, telemetry ordered for 24 hours  Past Medical History:  Diagnosis Date  . CHF (congestive heart failure) ([place])   . CKD (chronic kidney disease) stage 3, GFR 30-59 ml/min (HCC)   . HTN (hypertension)   . Sarcoidosis    History reviewed. No pertinent surgical history.  Social History:  reports that he has never smoked. He has never used smokeless tobacco. He reports current alcohol use. He reports that he does not use drugs.  Allergies  Allergen Reactions  . Amlodipine Swelling   Family History  Problem Relation Age of Onset  . Hypertension Mother   . Kidney failure Mother   . Diabetes Father   . Hypertension Father    Family history: Family history reviewed and not pertinent  Prior to Admission medications   Medication Sig Start Date End Date Taking? Authorizing Provider  carvedilol (COREG) 25 MG tablet Take 1 tablet (25 mg total) by mouth 2 (two) times daily with a meal. 05/08/17   Barton Dubois, MD  furosemide (LASIX) 40 MG tablet Take 1 tablet (40 mg total) by mouth 2 (two) times daily. 05/08/17 05/08/18  Barton Dubois, MD  isosorbide-hydrALAZINE (BIDIL) 20-37.5 MG tablet Take 1 tablet by mouth 2 (two) times daily. 05/08/17   Barton Dubois, MD  lisinopril-hydrochlorothiazide (PRINZIDE,ZESTORETIC) 20-25 MG per tablet Take 1 tablet by mouth daily.  06/27/13  [provider]   Physical Exam: Vitals:   10/06/20 0441 10/06/20 0500 10/06/20 0600 10/06/20 0630  BP: (!) 205/153 (!) 210/130 (!) 199/135 (!) 183/126  Pulse:  76 69   Resp:  _0 Temp:  98.5 F (36.9 C) 98.6 F (37 C)   TempSrc:  Oral Oral    SpO2:  95% 95%   Weight:      Height:       Constitutional: appears age-appropriate, NAD, calm, comfortable Eyes: PERRL, lids and conjunctivae normal ENMT: Mucous membranes are moist. Posterior pharynx clear of any exudate or lesions. Age-appropriate dentition. Hearing appropriate Neck: normal, supple, no masses, no thyromegaly Respiratory: clear to auscultation bilaterally, no wheezing, no crackles. Normal respiratory effort. No accessory muscle use.  Cardiovascular: Regular rate and rhythm, no murmurs / rubs / gallops. 2+ extremity edema bilaterally. 2+ pedal pulses. No carotid bruits.  Abdomen: no tenderness, no masses palpated, no hepatosplenomegaly. Bowel sounds positive.  Musculoskeletal: no clubbing / cyanosis. No joint deformity upper and lower extremities. Good ROM, no contractures, no atrophy. Normal muscle tone.  Skin: no rashes, lesions, ulcers. No induration Neurologic: Sensation intact. Strength 5/5 in all 4.  Psychiatric: Normal judgment and insight. Alert and oriented x 3. Normal mood.   EKG: independently reviewed, showing sinus rhythm with a rate of 79, QTc 515, right bundle branch block  Chest x-ray on Admission: I personally reviewed and I agree with radiologist reading as below.  DG Chest 2 View  Result Date: 10/05/2020 CLINICAL DATA:  Dyspnea and peripheral swelling EXAM: CHEST - 2 VIEW COMPARISON:  05/08/2017 FINDINGS: Cardiac shadow is at the upper limits of normal in size. Mild vascular congestion is seen.  Mild left basilar atelectasis is noted. No bony abnormality is seen. IMPRESSION: Mild vascular congestion with left basilar atelectasis. Electronically Signed   By: Inez Catalina M.D.   On: 10/05/2020 19:37   Labs on Admission: I have personally reviewed following labs  CBC: Recent Labs  Lab 10/05/20 1949 10/06/20 0336  WBC 5.5 5.5  NEUTROABS 3.1  --   HGB 12.1* 12.2*  HCT 37.1* 37.7*  MCV 93.9 95.0  PLT 147* 037   Basic Metabolic Panel: Recent Labs   Lab 10/05/20 1949 10/06/20 0336  NA 140 141  K 4.7 4.2  CL 111 111  CO2 19* 21*  GLUCOSE 101* 102*  BUN 48* 45*  CREATININE 5.26* 5.19*  CALCIUM 8.6* 9.3   GFR: Estimated Creatinine Clearance: 22.7 mL/min (A) (by C-G formula based on SCr of 5.19 mg/dL (H)).  Liver Function Tests: Recent Labs  Lab 10/05/20 1949  AST 20  ALT 17  ALKPHOS 78  BILITOT 0.8  PROT 6.7  ALBUMIN 3.6   Urine analysis:    Component Value Date/Time   COLORURINE YELLOW 06/27/2013 0839   APPEARANCEUR CLEAR 06/27/2013 0839   LABSPEC 1.011 06/27/2013 0839   PHURINE 6.5 06/27/2013 0839   GLUCOSEU NEGATIVE 06/27/2013 0839   HGBUR NEGATIVE 06/27/2013 0839   BILIRUBINUR NEGATIVE 06/27/2013 0839   KETONESUR NEGATIVE 06/27/2013 0839   PROTEINUR 30 (A) 06/27/2013 0839   UROBILINOGEN 0.2 06/27/2013 0839   NITRITE NEGATIVE 06/27/2013 0839   LEUKOCYTESUR NEGATIVE 06/27/2013 0839   Amy N Cox D.O. [facility]  If 7PM-7AM, please contact overnight-coverage provider If 7AM-7PM, please contact day coverage provider [email or web address]  10/06/2020, 7:34 AM

## 2020-10-07 ENCOUNTER — Inpatient Hospital Stay (HOSPITAL_COMMUNITY): Payer: Commercial Managed Care - PPO

## 2020-10-07 DIAGNOSIS — I129 Hypertensive chronic kidney disease with stage 1 through stage 4 chronic kidney disease, or unspecified chronic kidney disease: Secondary | ICD-10-CM

## 2020-10-07 DIAGNOSIS — N184 Chronic kidney disease, stage 4 (severe): Secondary | ICD-10-CM

## 2020-10-07 DIAGNOSIS — E872 Acidosis: Secondary | ICD-10-CM

## 2020-10-07 LAB — CBC WITH DIFFERENTIAL/PLATELET
Abs Immature Granulocytes: 0.02 10*3/uL (ref 0.00–0.07)
Basophils Absolute: 0 10*3/uL (ref 0.0–0.1)
Basophils Relative: 0 %
Eosinophils Absolute: 0.1 10*3/uL (ref 0.0–0.5)
Eosinophils Relative: 2 %
HCT: 34 % — ABNORMAL LOW (ref 39.0–52.0)
Hemoglobin: 11.1 g/dL — ABNORMAL LOW (ref 13.0–17.0)
Immature Granulocytes: 0 %
Lymphocytes Relative: 21 %
Lymphs Abs: 1.1 10*3/uL (ref 0.7–4.0)
MCH: 30.8 pg (ref 26.0–34.0)
MCHC: 32.6 g/dL (ref 30.0–36.0)
MCV: 94.4 fL (ref 80.0–100.0)
Monocytes Absolute: 0.9 10*3/uL (ref 0.1–1.0)
Monocytes Relative: 17 %
Neutro Abs: 3.2 10*3/uL (ref 1.7–7.7)
Neutrophils Relative %: 60 %
Platelets: 155 10*3/uL (ref 150–400)
RBC: 3.6 MIL/uL — ABNORMAL LOW (ref 4.22–5.81)
RDW: 13.8 % (ref 11.5–15.5)
WBC: 5.3 10*3/uL (ref 4.0–10.5)
nRBC: 0 % (ref 0.0–0.2)

## 2020-10-07 LAB — COMPREHENSIVE METABOLIC PANEL
ALT: 16 U/L (ref 0–44)
AST: 16 U/L (ref 15–41)
Albumin: 2.9 g/dL — ABNORMAL LOW (ref 3.5–5.0)
Alkaline Phosphatase: 70 U/L (ref 38–126)
Anion gap: 10 (ref 5–15)
BUN: 48 mg/dL — ABNORMAL HIGH (ref 6–20)
CO2: 20 mmol/L — ABNORMAL LOW (ref 22–32)
Calcium: 8.7 mg/dL — ABNORMAL LOW (ref 8.9–10.3)
Chloride: 107 mmol/L (ref 98–111)
Creatinine, Ser: 5.52 mg/dL — ABNORMAL HIGH (ref 0.61–1.24)
GFR, Estimated: 12 mL/min — ABNORMAL LOW (ref >60)
Glucose, Bld: 105 mg/dL — ABNORMAL HIGH (ref 70–99)
Potassium: 4.1 mmol/L (ref 3.5–5.1)
Sodium: 137 mmol/L (ref 135–145)
Total Bilirubin: 1.2 mg/dL (ref 0.3–1.2)
Total Protein: 6.1 g/dL — ABNORMAL LOW (ref 6.5–8.1)

## 2020-10-07 LAB — PHOSPHORUS: Phosphorus: 4.2 mg/dL (ref 2.5–4.6)

## 2020-10-07 LAB — MAGNESIUM: Magnesium: 2.2 mg/dL (ref 1.7–2.4)

## 2020-10-07 NOTE — Plan of Care (Signed)

## 2020-10-07 NOTE — Consult Note (Signed)
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 BUN stable. Responding well to IV diuretics but still appears hypervolemic on exam. Suspect resolution of AKI with improvement of blood pressure and continued diuretics. Electrolytes stable. Corrected calcium of 9.6. Urgent dialysis not needed at this time. Monitor fluid status closely and hold lasix once euvolemic. Renal US with evidence of medical renal disease. No obstruction or other structural abnormality present.  -daily CMP -continue management of BP per primary -continue PO diuretics, hold if euvolemic -patient will need close follow up with [facility] once discharged  Severe Symptomatic Hypertension  Patient with fluctuating BP's, overall improved. Will need to be on stable regimen at discharge to prevent worsening of CKD. Patient did mention high salt diets the past few weeks which may have been contributing to fluid retention.Current regimen of coreg 25 mg BID, clonidine .1 mg TID, lasix 40 mg IV BID, BIDIL 20-37.5 mg BID. PRN labetalol 10 mg q2h and hydralazine q4h PRN. If not able to stabilize BP with oral meds, may need to restart IV meds. -continue to monitor BP with PRN parameters in place -continue diuretics for fluid status managment -consider outpatient sleep study to rule out OSA as possible contributing factor to persistent HTN  Hematuria UA with evidence of hemoglobinuria, RBCs present. Over 300 protein with small leukocytes. Will send for urine culture and analyze microscopy.    Acute on chronic diastolic CHF exacerbation 2/2 severe symptomatic hypertension Echo performed yesterday, severe concentric LVH. Suspected to be secondary to persistent hypertension. Continue diuretic therapy as patient still appears hypervolemic on exam and continue to improve BP -continue  diuretics and monitor kidney function/electrolytes -once euvolemic, consider stopping diuretics  Secondary hyperparathyroidism:  PTH 148 in 05/2020.  Calcium and phosphorus stable.    Anemia of CKD: Hemoglobin stable, continue to monitor   Pulmonary sarcoidosis Follow up with pulmonologist on outpatient basis.  Please await attending attestation for final recommendations  [place]  Internal Medicine Resident PGY-1 [place]  Pager: [phone]  Subjective:   Ian Gregory states he is feeling well today, denies episodes of chest tightness or shortness of breath. Did not sleep well since needing to be awoken periodically. Continuing to urinate well. Denies being symptomatic from high blood pressure. No other concerns or questions at this time.    Objective:   BP (!) 203/127 (BP Location: Left Arm)   Pulse 88   Temp 98.7 F (37.1 C) (Oral)   Resp 18   Ht 6\' 3"  (1.905 m)   Wt 119.7 kg   SpO2 98%   BMI 33.00 kg/m   Intake/Output Summary (Last 24 hours) at 10/07/2020 0854 Last data filed at 10/07/2020 0000 Gross per 24 hour  Intake 1664.89 ml  Output 1575 ml  Net 89.89 ml   Weight change:   Physical Exam: DEY:CXKG appearing, sitting in chair comfortably YJE:HUDJSHF rate and rhythm. Systolic murmur 2/6. No gallops rubs or s3 present. No JVD present. Trace-1+ lower extremity edema Resp:CTA. No wheezing, rales, or rhonchi WYO:VZCH, nontender Ext: warm to touch  Imaging: DG Chest 2 View  Result Date: 10/05/2020 CLINICAL DATA:  Dyspnea and peripheral swelling EXAM: CHEST - 2 VIEW COMPARISON:  05/08/2017 FINDINGS: Cardiac shadow is at the upper limits of normal in size. Mild vascular congestion is seen. Mild left basilar atelectasis is noted. No bony abnormality is seen. IMPRESSION: Mild  vascular congestion with left basilar atelectasis. Electronically Signed   By: Inez Catalina M.D.   On: 10/05/2020 19:37   US RENAL  Result Date: 10/06/2020 CLINICAL DATA:   Acute renal injury. EXAM: RENAL / URINARY TRACT ULTRASOUND COMPLETE COMPARISON:  None. FINDINGS: Right Kidney: Renal measurements: 8.3 cm x 5.3 cm x 4.0 cm = volume: 93.00 mL. Diffusely increased echogenicity of the renal parenchyma is seen. No mass or hydronephrosis visualized. Left Kidney: Renal measurements: 9.5 cm x 4.7 cm x 4.1 cm = volume: 94.74 mL. Diffusely increased echogenicity of the renal parenchyma is seen. No mass or hydronephrosis visualized. Bladder: Appears normal for degree of bladder distention. Other: Limited study secondary to overlying bowel gas. IMPRESSION: Diffusely increased echogenicity of the renal parenchyma which may be secondary to medical renal disease. Electronically Signed   By: Virgina Norfolk M.D.   On: 10/06/2020 23:20   DG CHEST PORT 1 VIEW  Result Date: 10/07/2020 CLINICAL DATA:  Shortness of breath and chest pain EXAM: PORTABLE CHEST 1 VIEW COMPARISON:  Oct 05, 2020 FINDINGS: There is atelectatic change in the left lower lung region. There is no edema or airspace opacity. There is cardiomegaly with pulmonary vascularity normal. No adenopathy. No bone lesions. IMPRESSION: No edema or airspace opacity. Left lower lobe atelectasis noted. Stable cardiomegaly. Electronically Signed   By: Lowella Grip III M.D.   On: 10/07/2020 08:23   ECHOCARDIOGRAM COMPLETE  Result Date: 10/06/2020    ECHOCARDIOGRAM REPORT   Patient Name:   Ian Gregory. Date of Exam: 10/06/2020 Medical Rec #:  [id number]          Height:       75.0 in Accession #:    [id number]         Weight:       264.0 lb Date of Birth:  07/12/1965          BSA:          2.469 m Patient Age:    55 years           BP:           181/125 mmHg Patient Gender: M                  HR:           66 bpm. Exam Location:  Inpatient Procedure: 2D Echo, Color Doppler and Cardiac Doppler Indications:    Dyspnea  History:        Patient has prior history of Echocardiogram examinations, most                 recent 05/06/2017.  CHF; Risk Factors:Hypertension.  Sonographer:    Cammy Brochure Referring Phys: [phone] AMY N COX IMPRESSIONS  1. Severe concentric LVH is present. Given the patient's history of poorly controlled hypertension, favors a diagnosis of hypertensive heart disease.  2. Left ventricular ejection fraction, by estimation, is 55 to 60%. The left ventricle has normal function. The left ventricle has no regional wall motion abnormalities. There is severe concentric left ventricular hypertrophy. Left ventricular diastolic  parameters are consistent with Grade II diastolic dysfunction (pseudonormalization). Elevated left atrial pressure.  3. Right ventricular systolic function is normal. The right ventricular size is normal. Tricuspid regurgitation signal is inadequate for assessing PA pressure.  4. The mitral valve is grossly normal. Mild mitral valve regurgitation. No evidence of mitral stenosis.  5. The aortic valve is tricuspid. Aortic valve regurgitation is trivial. Mild aortic  valve sclerosis is present, with no evidence of aortic valve stenosis.  6. Aortic dilatation noted. There is mild dilatation of the aortic root, measuring 40 mm.  7. The inferior vena cava is normal in size with greater than 50% respiratory variability, suggesting right atrial pressure of 3 mmHg. FINDINGS  Left Ventricle: Left ventricular ejection fraction, by estimation, is 55 to 60%. The left ventricle has normal function. The left ventricle has no regional wall motion abnormalities. The left ventricular internal cavity size was normal in size. There is  severe concentric left ventricular hypertrophy. Left ventricular diastolic parameters are consistent with Grade II diastolic dysfunction (pseudonormalization). Elevated left atrial pressure. Right Ventricle: The right ventricular size is normal. No increase in right ventricular wall thickness. Right ventricular systolic function is normal. Tricuspid regurgitation signal is inadequate for  assessing PA pressure. Left Atrium: Left atrial size was normal in size. Right Atrium: Right atrial size was normal in size. Pericardium: Trivial pericardial effusion is present. Mitral Valve: The mitral valve is grossly normal. Mild mitral valve regurgitation. No evidence of mitral valve stenosis. Tricuspid Valve: The tricuspid valve is grossly normal. Tricuspid valve regurgitation is trivial. No evidence of tricuspid stenosis. Aortic Valve: The aortic valve is tricuspid. Aortic valve regurgitation is trivial. Mild aortic valve sclerosis is present, with no evidence of aortic valve stenosis. Aortic valve mean gradient measures 5.0 mmHg. Aortic valve peak gradient measures 9.5 mmHg. Aortic valve area, by VTI measures 2.63 cm. Pulmonic Valve: The pulmonic valve was grossly normal. Pulmonic valve regurgitation is trivial. No evidence of pulmonic stenosis. Aorta: Aortic dilatation noted. There is mild dilatation of the aortic root, measuring 40 mm. Venous: The inferior vena cava is normal in size with greater than 50% respiratory variability, suggesting right atrial pressure of 3 mmHg. IAS/Shunts: The atrial septum is grossly normal.  LEFT VENTRICLE PLAX 2D LVIDd:         4.30 cm  Diastology LVIDs:         2.90 cm  LV e' medial:    4.79 cm/s LV PW:         2.40 cm  LV E/e' medial:  23.4 LV IVS:        2.30 cm  LV e' lateral:   5.98 cm/s LVOT diam:     2.30 cm  LV E/e' lateral: 18.7 LV SV:         72 LV SV Index:   29 LVOT Area:     4.15 cm  RIGHT VENTRICLE             IVC RV Basal diam:  4.30 cm     IVC diam: 1.40 cm RV S prime:     14.60 cm/s LEFT ATRIUM             Index       RIGHT ATRIUM           Index LA diam:        4.10 cm 1.66 cm/m  RA Area:     23.60 cm LA Vol (A2C):   83.2 ml 33.70 ml/m RA Volume:   72.70 ml  29.44 ml/m LA Vol (A4C):   78.9 ml 31.95 ml/m LA Biplane Vol: 81.9 ml 33.17 ml/m  AORTIC VALVE AV Area (Vmax):    2.75 cm AV Area (Vmean):   2.56 cm AV Area (VTI):     2.63 cm AV Vmax:            154.00 cm/s AV Vmean:  105.500 cm/s AV VTI:            0.274 m AV Peak Grad:      9.5 mmHg AV Mean Grad:      5.0 mmHg LVOT Vmax:         102.00 cm/s LVOT Vmean:        65.100 cm/s LVOT VTI:          0.174 m LVOT/AV VTI ratio: 0.63  AORTA Ao Root diam: 4.00 cm Ao Asc diam:  3.90 cm MITRAL VALVE MV Area (PHT): 3.17 cm     SHUNTS MV Decel Time: 239 msec     Systemic VTI:  0.17 m MV E velocity: 112.00 cm/s  Systemic Diam: 2.30 cm MV A velocity: 75.30 cm/s MV E/A ratio:  1.49 Eleonore Chiquito MD Electronically signed by Eleonore Chiquito MD Signature Date/Time: 10/06/2020/3:18:10 PM    Final     Labs: BMET Recent Labs  Lab 10/05/20 1949 10/06/20 0336 10/07/20 0320  NA 140 141 137  K 4.7 4.2 4.1  CL 111 111 107  CO2 19* 21* 20*  GLUCOSE 101* 102* 105*  BUN 48* 45* 48*  CREATININE 5.26* 5.19* 5.52*  CALCIUM 8.6* 9.3 8.7*  PHOS  --   --  4.2   CBC Recent Labs  Lab 10/05/20 1949 10/06/20 0336 10/07/20 0320  WBC 5.5 5.5 5.3  NEUTROABS 3.1  --  3.2  HGB 12.1* 12.2* 11.1*  HCT 37.1* 37.7* 34.0*  MCV 93.9 95.0 94.4  PLT 147* 156 155    Medications:    . carvedilol  25 mg Oral BID WC  . cloNIDine  0.1 mg Oral TID  . furosemide  40 mg Intravenous BID  . heparin  5,000 Units Subcutaneous Q8H  . isosorbide-hydrALAZINE  1 tablet Oral BID

## 2020-10-07 NOTE — Progress Notes (Signed)
PROGRESS NOTE    Ian Gregory.  SFK:[id number] DOB: 1965-07-23 DOA: 10/05/2020 PCP: [facility]   Brief Narrative:  The patient is a 55 year old obese African-American male with a past medical history significant for but not limited to hypertension, CKD stage IV, history of diastolic CHF, sarcoidosis, as well as other comorbidities who presented to the emergency room with a chief concern of shortness of breath.  He reports that the shortness of breath and going on for the last month or so is worse with exertion and states that he is not able to lay flat and has to have 3 pillows being propped up to sleep.  He presented to his PCP on 10/05/2020 and was told to go to the ED given his elevated blood pressure.  He reported shortness of breath that has been going on for about a month and states has been difficult for him to obtain an appointment to see his PCP.  He also endorses bilateral lower extremity edema that has been going on for years but acutely worsened.  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    **Interim History  Blood pressure still remains elevated and he still appears a little hypervolemic so we will continue IV diuretics.  Nephrology has made some adjustments in his blood pressure medications as well.  I recommended continue IV diuresis today and continue monitor renal function carefully as his renal function went up slightly.  They are working up his hematuria and sending a urine culture and analyze any microscopy and we may need to discuss with urology.  Patient Is off the diltiazem drip now  Assessment & Plan:   Principal Problem:   Hypertensive emergency Active Problems:   Accelerated hypertension   Acute renal failure superimposed on stage 3 chronic kidney disease (HCC)   Anemia of chronic renal failure, stage 3 (moderate) (HCC)   ARF (acute renal failure) (HCC)  Hypertensive  emergency/urgency Accelerated hypertension -Initiated onDiltiazem gtt but will attempt to wean and is now off of the diltiazem drip -Given a Dose of IV Lasix 80 mg x1 and now has been changed to IV Lasix 40 mg every 12 -Resumed home antihypertensives: Isosorbide hydralazine 20-30 7.5 twice daily, Coreg 25 mg twice daily; nephrology is added clonidine 0.1 mg p.o. 3 times daily -With as needed antihypertensives with labetalol 10 mg IV every 2 as needed for systolic blood pressure in 174 or diastolic blood pressure greater 100 in addition to IV hydralazine 10 mg every 4 as needed's for SBP greater than 180 or DBP within 100 -Complete echo ordered and is as below -Continue to Monitor BP per Protocol  -Last blood pressure elevated and was 197/102 and may need further medication adjustments  Acute on Chronic Diastolic CHF -last echocardiogram was done in 2018 and showed a normal EF of 55 to 60% with grade 1 diastolic dysfunction -Repeat echocardiogram this time and it showed severe concentric LVH as well as a left ventricular EF of 50 to 55% and grade 2 diastolic dysfunction -BNP was 441.9 and patient is unable to lie flat and has dyspnea on exertion along with pitting edema -Start diuresis with IV Lasix and was given IV 80 mg yesterday and will continue IV 40 mg twice daily -Strict I's and O's and daily weights; the patient is -1.667 L since admission and weight is down 13 lbs  -Nephrology consulted for further assistance and evaluation given  his renal dysfunction -Patient was weaned off of diltiazem drip and will need to continue monitor his renal function carefully as he is being diuresed  Acute Kidney Injury on CKD stage IV-suspect secondary to hypertensive emergency Metabolic Acidosis 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 -BUN/creatinine went from 48/5.26 -> 45/5.19 -> 48/5.52 -Patient has some mild metabolic acidosis with a CO2  of 20, anion gap of 10, chloride level of 107 -Nephrology feels the patient is still hypervolemic on examination and recommending continue IV diuretics today and he is currently on IV 40 mg twice daily -This is not needed at this time and recommending to continue to monitor fluid status carefully and hold Lasix once he is euvolemic -PTH was 148 back in January of this year; Ca2+ was 8.7 and Phos was 4.2  -Renal ultrasound done and showed no evidence of medical renal disease and no obstruction -Continue to monitor blood pressure carefully -Avoid nephrotoxic medications, contrast dyes, hypotension and renally dose medications -Repeat CMP in a.m.  Normocytic Anemia/Anemia of chronic kidney disease -Patient's Hgb/Hct went from 12.1/37.1 -> 12.2/37.7 -> 11.1/34.0 -Check Anemia panel in the a.m. -Continue monitor for signs and symptoms bleeding; include neuro overt bleeding noted  -Repeat CBC in a.m.  History of hematuria and suprapubic abdominal pain-patient urine was negative for hematuria during this hospitalization -If persists with recommend a.m. team to order a CT stone chaser to assess for kidney stones -U/A done and showed moderate hemoglobin on urine dipstick, small leukocytes, negative nitrites, greater than 300 protein, no bacteria seen, 21-50 RBCs per high-powered field, 0-5 squamous epithelial cells, greater than 50 WBCs -Urine culture is being checked and there is minimum and analysis microscopy -May need to speak with urology  Pulmonary Sarcoidosis -CXR this AM showed "There is atelectatic change in the left lower lung region. There is no edema or airspace opacity. There is cardiomegaly with pulmonary vascularity normal. No adenopathy. No bone lesions." -He will need outpatient follow-up with his Pulmonologist  Obesity -Complicates overall prognosis and care -Estimated body mass index is 33 kg/m as calculated from the following:   Height as of this encounter: 6\' 3"  (1.905  m).   Weight as of this encounter: 119.7 kg. -Weight Loss and Dietary Counseling given   DVT prophylaxis: Heparin 5,000 units sq q8h  Code Status: FULL CODE  Family Communication: No family present at bedside  Disposition Plan: Pending further clinical improvement and clearance by Nephrology Status is: Inpatient  Remains inpatient appropriate because:Unsafe d/c plan, IV treatments appropriate due to intensity of illness or inability to take PO and Inpatient level of care appropriate due to severity of illness   Dispo: The patient is from: Home              Anticipated d/c is to: Home              Patient currently is not medically stable to d/c.   Difficult to place patient No  Consultants:   Nephrology   Procedures:  ECHOCARDIOGRAM IMPRESSIONS    1. Severe concentric LVH is present. Given the patient's history of  poorly controlled hypertension, favors a diagnosis of hypertensive heart  disease.  2. Left ventricular ejection fraction, by estimation, is 55 to 60%. The  left ventricle has normal function. The left ventricle has no regional  wall motion abnormalities. There is severe concentric left ventricular  hypertrophy. Left ventricular diastolic  parameters are consistent with Grade II diastolic dysfunction  (pseudonormalization). Elevated left  atrial pressure.  3. Right ventricular systolic function is normal. The right ventricular  size is normal. Tricuspid regurgitation signal is inadequate for assessing  PA pressure.  4. The mitral valve is grossly normal. Mild mitral valve regurgitation.  No evidence of mitral stenosis.  5. The aortic valve is tricuspid. Aortic valve regurgitation is trivial.  Mild aortic valve sclerosis is present, with no evidence of aortic valve  stenosis.  6. Aortic dilatation noted. There is mild dilatation of the aortic root,  measuring 40 mm.  7. The inferior vena cava is normal in size with greater than 50%  respiratory  variability, suggesting right atrial pressure of 3 mmHg.   FINDINGS  Left Ventricle: Left ventricular ejection fraction, by estimation, is 55  to 60%. The left ventricle has normal function. The left ventricle has no  regional wall motion abnormalities. The left ventricular internal cavity  size was normal in size. There is  severe concentric left ventricular hypertrophy. Left ventricular  diastolic parameters are consistent with Grade II diastolic dysfunction  (pseudonormalization). Elevated left atrial pressure.   Right Ventricle: The right ventricular size is normal. No increase in  right ventricular wall thickness. Right ventricular systolic function is  normal. Tricuspid regurgitation signal is inadequate for assessing PA  pressure.   Left Atrium: Left atrial size was normal in size.   Right Atrium: Right atrial size was normal in size.   Pericardium: Trivial pericardial effusion is present.   Mitral Valve: The mitral valve is grossly normal. Mild mitral valve  regurgitation. No evidence of mitral valve stenosis.   Tricuspid Valve: The tricuspid valve is grossly normal. Tricuspid valve  regurgitation is trivial. No evidence of tricuspid stenosis.   Aortic Valve: The aortic valve is tricuspid. Aortic valve regurgitation is  trivial. Mild aortic valve sclerosis is present, with no evidence of  aortic valve stenosis. Aortic valve mean gradient measures 5.0 mmHg.  Aortic valve peak gradient measures 9.5  mmHg. Aortic valve area, by VTI measures 2.63 cm.   Pulmonic Valve: The pulmonic valve was grossly normal. Pulmonic valve  regurgitation is trivial. No evidence of pulmonic stenosis.   Aorta: Aortic dilatation noted. There is mild dilatation of the aortic  root, measuring 40 mm.   Venous: The inferior vena cava is normal in size with greater than 50%  respiratory variability, suggesting right atrial pressure of 3 mmHg.   IAS/Shunts: The atrial septum is grossly normal.      LEFT VENTRICLE  PLAX 2D  LVIDd:     4.30 cm Diastology  LVIDs:     2.90 cm LV e' medial:  4.79 cm/s  LV PW:     2.40 cm LV E/e' medial: 23.4  LV IVS:    2.30 cm LV e' lateral:  5.98 cm/s  LVOT diam:   2.30 cm LV E/e' lateral: 18.7  LV SV:     72  LV SV Index:  29  LVOT Area:   4.15 cm     RIGHT VENTRICLE       IVC  RV Basal diam: 4.30 cm   IVC diam: 1.40 cm  RV S prime:   14.60 cm/s   LEFT ATRIUM       Index    RIGHT ATRIUM      Index  LA diam:    4.10 cm 1.66 cm/m RA Area:   23.60 cm  LA Vol (A2C):  83.2 ml 33.70 ml/m RA Volume:  72.70 ml 29.44 ml/m  LA Vol (A4C):  78.9 ml 31.95 ml/m  LA Biplane Vol: 81.9 ml 33.17 ml/m  AORTIC VALVE  AV Area (Vmax):  2.75 cm  AV Area (Vmean):  2.56 cm  AV Area (VTI):   2.63 cm  AV Vmax:      154.00 cm/s  AV Vmean:     105.500 cm/s  AV VTI:      0.274 m  AV Peak Grad:   9.5 mmHg  AV Mean Grad:   5.0 mmHg  LVOT Vmax:     102.00 cm/s  LVOT Vmean:    65.100 cm/s  LVOT VTI:     0.174 m  LVOT/AV VTI ratio: 0.63    AORTA  Ao Root diam: 4.00 cm  Ao Asc diam: 3.90 cm   MITRAL VALVE  MV Area (PHT): 3.17 cm   SHUNTS  MV Decel Time: 239 msec   Systemic VTI: 0.17 m  MV E velocity: 112.00 cm/s Systemic Diam: 2.30 cm  MV A velocity: 75.30 cm/s  MV E/A ratio: 1.49   Antimicrobials:  Anti-infectives (From admission, onward)   None        Subjective: Seen and examined at bedside and thinks his shortness of breath is doing a bit better.  Has not really ambulated.  Thinks his leg swelling is improving but blood pressure continues to still be an issue.  States he had a bowel movement yesterday.  Denies any other concerns or complaints at this time.  Objective: Vitals:   10/07/20 0514 10/07/20 0722 10/07/20 1002 10/07/20 1158  BP: (!) 174/107 (!) 203/127 (!) 140/93 (!) 154/108  Pulse: 83 88 81 78  Resp:   18    Temp:  98.7 F (37.1 C)  98.3 F (36.8 C)  TempSrc:  Oral  Oral  SpO2:  98% 97% 98%  Weight:      Height:        Intake/Output Summary (Last 24 hours) at 10/07/2020 1339 Last data filed at 10/07/2020 1208 Gross per 24 hour  Intake 1184.89 ml  Output 1325 ml  Net -140.11 ml   Filed Weights   10/05/20 1821 10/06/20 0245  Weight: 125.6 kg 119.7 kg   Examination: Physical Exam:  Constitutional: WN/WD overweight African-American male currently in no acute distress appears calm Eyes: Lids and conjunctivae normal, sclerae anicteric  ENMT: External Ears, Nose appear normal. Grossly normal hearing.  Neck: Appears normal, supple, no cervical masses, normal ROM, no appreciable thyromegaly; no JVD Respiratory: Diminished to auscultation bilaterally with coarse breath sounds, no wheezing, rales, rhonchi or crackles. Normal respiratory effort and patient is not tachypenic. No accessory muscle use.  Unlabored breathing Cardiovascular: RRR, no murmurs / rubs / gallops. S1 and S2 auscultated. 1+ LE Edema  Abdomen: Soft, non-tender, Distended 2/2 body habitus. Bowel sounds positive.  GU: Deferred. Musculoskeletal: No clubbing / cyanosis of digits/nails. No joint deformity upper and lower extremities.  Skin: No rashes, lesions, ulcers. No induration; Warm and dry.  Neurologic: CN 2-12 grossly intact with no focal deficits. Romberg sign and cerebellar reflexes not assessed.  Psychiatric: Normal judgment and insight. Alert and oriented x 3. Normal mood and appropriate affect.   Data Reviewed: I have personally reviewed following labs and imaging studies  CBC: Recent Labs  Lab 10/05/20 1949 10/06/20 0336 10/07/20 0320  WBC 5.5 5.5 5.3  NEUTROABS 3.1  --  3.2  HGB 12.1* 12.2* 11.1*  HCT 37.1* 37.7* 34.0*  MCV 93.9 95.0 94.4  PLT 147* 156 338   Basic Metabolic Panel: Recent  Labs  Lab 10/05/20 1949 10/06/20 0336 10/07/20 0320  NA 140 141 137  K 4.7 4.2 4.1  CL 111 111 107  CO2  19* 21* 20*  GLUCOSE 101* 102* 105*  BUN 48* 45* 48*  CREATININE 5.26* 5.19* 5.52*  CALCIUM 8.6* 9.3 8.7*  MG  --   --  2.2  PHOS  --   --  4.2   GFR: Estimated Creatinine Clearance: 21.3 mL/min (A) (by C-G formula based on SCr of 5.52 mg/dL (H)). Liver Function Tests: Recent Labs  Lab 10/05/20 1949 10/07/20 0320  AST 20 16  ALT 17 16  ALKPHOS 78 70  BILITOT 0.8 1.2  PROT 6.7 6.1*  ALBUMIN 3.6 2.9*   No results for input(s): LIPASE, AMYLASE in the last 168 hours. No results for input(s): AMMONIA in the last 168 hours. Coagulation Profile: Recent Labs  Lab 10/06/20 0347  INR 1.1   Cardiac Enzymes: No results for input(s): CKTOTAL, CKMB, CKMBINDEX, TROPONINI in the last 168 hours. BNP (last 3 results) No results for input(s): PROBNP in the last 8760 hours. HbA1C: No results for input(s): HGBA1C in the last 72 hours. CBG: No results for input(s): GLUCAP in the last 168 hours. Lipid Profile: No results for input(s): CHOL, HDL, LDLCALC, TRIG, CHOLHDL, LDLDIRECT in the last 72 hours. Thyroid Function Tests: No results for input(s): TSH, T4TOTAL, FREET4, T3FREE, THYROIDAB in the last 72 hours. Anemia Panel: No results for input(s): VITAMINB12, FOLATE, FERRITIN, TIBC, IRON, RETICCTPCT in the last 72 hours. Sepsis Labs: No results for input(s): PROCALCITON, LATICACIDVEN in the last 168 hours.  Recent Results (from the past 240 hour(s))  Resp Panel by RT-PCR (Flu A&B, Covid) Nasopharyngeal Swab     Status: None   Collection Time: 10/05/20  9:24 PM   Specimen: Nasopharyngeal Swab; Nasopharyngeal(NP) swabs in vial transport medium  Result Value Ref Range Status   SARS Coronavirus 2 by RT PCR NEGATIVE NEGATIVE Final    Comment: (NOTE) SARS-CoV-2 target nucleic acids are NOT DETECTED.  The SARS-CoV-2 RNA is generally detectable in upper respiratory specimens during the acute phase of infection. The lowest concentration of SARS-CoV-2 viral copies this assay can detect  is 138 copies/mL. A negative result does not preclude SARS-Cov-2 infection and should not be used as the sole basis for treatment or other patient management decisions. A negative result may occur with  improper specimen collection/handling, submission of specimen other than nasopharyngeal swab, presence of viral mutation(s) within the areas targeted by this assay, and inadequate number of viral copies(<138 copies/mL). A negative result must be combined with clinical observations, patient history, and epidemiological information. The expected result is Negative.  Fact Sheet for Patients:  EntrepreneurPulse.com.au  Fact Sheet for Healthcare Providers:  IncredibleEmployment.be  This test is no t yet approved or cleared by the [place] FDA and  has been authorized for detection and/or diagnosis of SARS-CoV-2 by FDA under an Emergency Use Authorization (EUA). This EUA will remain  in effect (meaning this test can be used) for the duration of the COVID-19 declaration under Section 564(b)(1) of the Act, 21 U.S.C.section 360bbb-3(b)(1), unless the authorization is terminated  or revoked sooner.       Influenza A by PCR NEGATIVE NEGATIVE Final   Influenza B by PCR NEGATIVE NEGATIVE Final    Comment: (NOTE) The Xpert Xpress SARS-CoV-2/FLU/RSV plus assay is intended as an aid in the diagnosis of influenza from Nasopharyngeal swab specimens and should not be used as a sole basis for treatment.  Nasal washings and aspirates are unacceptable for Xpert Xpress SARS-CoV-2/FLU/RSV testing.  Fact Sheet for Patients: EntrepreneurPulse.com.au  Fact Sheet for Healthcare Providers: IncredibleEmployment.be  This test is not yet approved or cleared by the [place] FDA and has been authorized for detection and/or diagnosis of SARS-CoV-2 by FDA under an Emergency Use Authorization (EUA). This EUA will remain in effect  (meaning this test can be used) for the duration of the COVID-19 declaration under Section 564(b)(1) of the Act, 21 U.S.C. section 360bbb-3(b)(1), unless the authorization is terminated or revoked.  Performed at [facility], [address], [place], [place] [address]     RN Pressure Injury Documentation:     Estimated body mass index is 33 kg/m as calculated from the following:   Height as of this encounter: 6\' 3"  (1.905 m).   Weight as of this encounter: 119.7 kg.  Malnutrition Type:   Malnutrition Characteristics:   Nutrition Interventions:   Radiology Studies: DG Chest 2 View  Result Date: 10/05/2020 CLINICAL DATA:  Dyspnea and peripheral swelling EXAM: CHEST - 2 VIEW COMPARISON:  05/08/2017 FINDINGS: Cardiac shadow is at the upper limits of normal in size. Mild vascular congestion is seen. Mild left basilar atelectasis is noted. No bony abnormality is seen. IMPRESSION: Mild vascular congestion with left basilar atelectasis. Electronically Signed   By: Inez Catalina M.D.   On: 10/05/2020 19:37   US RENAL  Result Date: 10/06/2020 CLINICAL DATA:  Acute renal injury. EXAM: RENAL / URINARY TRACT ULTRASOUND COMPLETE COMPARISON:  None. FINDINGS: Right Kidney: Renal measurements: 8.3 cm x 5.3 cm x 4.0 cm = volume: 93.00 mL. Diffusely increased echogenicity of the renal parenchyma is seen. No mass or hydronephrosis visualized. Left Kidney: Renal measurements: 9.5 cm x 4.7 cm x 4.1 cm = volume: 94.74 mL. Diffusely increased echogenicity of the renal parenchyma is seen. No mass or hydronephrosis visualized. Bladder: Appears normal for degree of bladder distention. Other: Limited study secondary to overlying bowel gas. IMPRESSION: Diffusely increased echogenicity of the renal parenchyma which may be secondary to medical renal disease. Electronically Signed   By: Virgina Norfolk M.D.   On: 10/06/2020 23:20   DG CHEST PORT 1 VIEW  Result Date: 10/07/2020 CLINICAL DATA:   Shortness of breath and chest pain EXAM: PORTABLE CHEST 1 VIEW COMPARISON:  Oct 05, 2020 FINDINGS: There is atelectatic change in the left lower lung region. There is no edema or airspace opacity. There is cardiomegaly with pulmonary vascularity normal. No adenopathy. No bone lesions. IMPRESSION: No edema or airspace opacity. Left lower lobe atelectasis noted. Stable cardiomegaly. Electronically Signed   By: Lowella Grip III M.D.   On: 10/07/2020 08:23   ECHOCARDIOGRAM COMPLETE  Result Date: 10/06/2020    ECHOCARDIOGRAM REPORT   Patient Name:   Ian Gregory. Date of Exam: 10/06/2020 Medical Rec #:  [id number]          Height:       75.0 in Accession #:    [id number]         Weight:       264.0 lb Date of Birth:  [birth year]          BSA:          2.469 m Patient Age:    77 years           BP:           181/125 mmHg Patient Gender: M  HR:           66 bpm. Exam Location:  Inpatient Procedure: 2D Echo, Color Doppler and Cardiac Doppler Indications:    Dyspnea  History:        Patient has prior history of Echocardiogram examinations, most                 recent 05/06/2017. CHF; Risk Factors:Hypertension.  Sonographer:    Cammy Brochure Referring Phys: [phone] AMY N [facility] IMPRESSIONS  1. Severe concentric LVH is present. Given the patient's history of poorly controlled hypertension, favors a diagnosis of hypertensive heart disease.  2. Left ventricular ejection fraction, by estimation, is 55 to 60%. The left ventricle has normal function. The left ventricle has no regional wall motion abnormalities. There is severe concentric left ventricular hypertrophy. Left ventricular diastolic  parameters are consistent with Grade II diastolic dysfunction (pseudonormalization). Elevated left atrial pressure.  3. Right ventricular systolic function is normal. The right ventricular size is normal. Tricuspid regurgitation signal is inadequate for assessing PA pressure.  4. The mitral valve is grossly  normal. Mild mitral valve regurgitation. No evidence of mitral stenosis.  5. The aortic valve is tricuspid. Aortic valve regurgitation is trivial. Mild aortic valve sclerosis is present, with no evidence of aortic valve stenosis.  6. Aortic dilatation noted. There is mild dilatation of the aortic root, measuring 40 mm.  7. The inferior vena cava is normal in size with greater than 50% respiratory variability, suggesting right atrial pressure of 3 mmHg. FINDINGS  Left Ventricle: Left ventricular ejection fraction, by estimation, is 55 to 60%. The left ventricle has normal function. The left ventricle has no regional wall motion abnormalities. The left ventricular internal cavity size was normal in size. There is  severe concentric left ventricular hypertrophy. Left ventricular diastolic parameters are consistent with Grade II diastolic dysfunction (pseudonormalization). Elevated left atrial pressure. Right Ventricle: The right ventricular size is normal. No increase in right ventricular wall thickness. Right ventricular systolic function is normal. Tricuspid regurgitation signal is inadequate for assessing PA pressure. Left Atrium: Left atrial size was normal in size. Right Atrium: Right atrial size was normal in size. Pericardium: Trivial pericardial effusion is present. Mitral Valve: The mitral valve is grossly normal. Mild mitral valve regurgitation. No evidence of mitral valve stenosis. Tricuspid Valve: The tricuspid valve is grossly normal. Tricuspid valve regurgitation is trivial. No evidence of tricuspid stenosis. Aortic Valve: The aortic valve is tricuspid. Aortic valve regurgitation is trivial. Mild aortic valve sclerosis is present, with no evidence of aortic valve stenosis. Aortic valve mean gradient measures 5.0 mmHg. Aortic valve peak gradient measures 9.5 mmHg. Aortic valve area, by VTI measures 2.63 cm. Pulmonic Valve: The pulmonic valve was grossly normal. Pulmonic valve regurgitation is trivial. No  evidence of pulmonic stenosis. Aorta: Aortic dilatation noted. There is mild dilatation of the aortic root, measuring 40 mm. Venous: The inferior vena cava is normal in size with greater than 50% respiratory variability, suggesting right atrial pressure of 3 mmHg. IAS/Shunts: The atrial septum is grossly normal.  LEFT VENTRICLE PLAX 2D LVIDd:         4.30 cm  Diastology LVIDs:         2.90 cm  LV e' medial:    4.79 cm/s LV PW:         2.40 cm  LV E/e' medial:  23.4 LV IVS:        2.30 cm  LV e' lateral:   5.98 cm/s LVOT  diam:     2.30 cm  LV E/e' lateral: 18.7 LV SV:         72 LV SV Index:   29 LVOT Area:     4.15 cm  RIGHT VENTRICLE             IVC RV Basal diam:  4.30 cm     IVC diam: 1.40 cm RV S prime:     14.60 cm/s LEFT ATRIUM             Index       RIGHT ATRIUM           Index LA diam:        4.10 cm 1.66 cm/m  RA Area:     23.60 cm LA Vol (A2C):   83.2 ml 33.70 ml/m RA Volume:   72.70 ml  29.44 ml/m LA Vol (A4C):   78.9 ml 31.95 ml/m LA Biplane Vol: 81.9 ml 33.17 ml/m  AORTIC VALVE AV Area (Vmax):    2.75 cm AV Area (Vmean):   2.56 cm AV Area (VTI):     2.63 cm AV Vmax:           154.00 cm/s AV Vmean:          105.500 cm/s AV VTI:            0.274 m AV Peak Grad:      9.5 mmHg AV Mean Grad:      5.0 mmHg LVOT Vmax:         102.00 cm/s LVOT Vmean:        65.100 cm/s LVOT VTI:          0.174 m LVOT/AV VTI ratio: 0.63  AORTA Ao Root diam: 4.00 cm Ao Asc diam:  3.90 cm MITRAL VALVE MV Area (PHT): 3.17 cm     SHUNTS MV Decel Time: 239 msec     Systemic VTI:  0.17 m MV E velocity: 112.00 cm/s  Systemic Diam: 2.30 cm MV A velocity: 75.30 cm/s MV E/A ratio:  1.49 Eleonore Chiquito MD Electronically signed by Eleonore Chiquito MD Signature Date/Time: 10/06/2020/3:18:10 PM    Final    Scheduled Meds: . carvedilol  25 mg Oral BID WC  . cloNIDine  0.1 mg Oral TID  . furosemide  40 mg Intravenous BID  . heparin  5,000 Units Subcutaneous Q8H  . isosorbide-hydrALAZINE  1 tablet Oral BID   Continuous  Infusions:   LOS: 1 day   Kerney Elbe, DO [facility] PAGER is on [place]  If 7PM-7AM, please contact night-coverage [email or web address]

## 2020-10-08 LAB — COMPREHENSIVE METABOLIC PANEL
ALT: 15 U/L (ref 0–44)
AST: 15 U/L (ref 15–41)
Albumin: 2.9 g/dL — ABNORMAL LOW (ref 3.5–5.0)
Alkaline Phosphatase: 69 U/L (ref 38–126)
Anion gap: 9 (ref 5–15)
BUN: 53 mg/dL — ABNORMAL HIGH (ref 6–20)
CO2: 20 mmol/L — ABNORMAL LOW (ref 22–32)
Calcium: 8.7 mg/dL — ABNORMAL LOW (ref 8.9–10.3)
Chloride: 110 mmol/L (ref 98–111)
Creatinine, Ser: 6.01 mg/dL — ABNORMAL HIGH (ref 0.61–1.24)
GFR, Estimated: 10 mL/min — ABNORMAL LOW (ref >60)
Glucose, Bld: 103 mg/dL — ABNORMAL HIGH (ref 70–99)
Potassium: 4.1 mmol/L (ref 3.5–5.1)
Sodium: 139 mmol/L (ref 135–145)
Total Bilirubin: 1.1 mg/dL (ref 0.3–1.2)
Total Protein: 6.3 g/dL — ABNORMAL LOW (ref 6.5–8.1)

## 2020-10-08 LAB — CBC WITH DIFFERENTIAL/PLATELET
Abs Immature Granulocytes: 0.03 10*3/uL (ref 0.00–0.07)
Basophils Absolute: 0 10*3/uL (ref 0.0–0.1)
Basophils Relative: 1 %
Eosinophils Absolute: 0.1 10*3/uL (ref 0.0–0.5)
Eosinophils Relative: 2 %
HCT: 33.7 % — ABNORMAL LOW (ref 39.0–52.0)
Hemoglobin: 10.9 g/dL — ABNORMAL LOW (ref 13.0–17.0)
Immature Granulocytes: 1 %
Lymphocytes Relative: 21 %
Lymphs Abs: 1.2 10*3/uL (ref 0.7–4.0)
MCH: 30.4 pg (ref 26.0–34.0)
MCHC: 32.3 g/dL (ref 30.0–36.0)
MCV: 94.1 fL (ref 80.0–100.0)
Monocytes Absolute: 0.9 10*3/uL (ref 0.1–1.0)
Monocytes Relative: 17 %
Neutro Abs: 3.1 10*3/uL (ref 1.7–7.7)
Neutrophils Relative %: 58 %
Platelets: 161 10*3/uL (ref 150–400)
RBC: 3.58 MIL/uL — ABNORMAL LOW (ref 4.22–5.81)
RDW: 13.8 % (ref 11.5–15.5)
WBC: 5.4 10*3/uL (ref 4.0–10.5)
nRBC: 0 % (ref 0.0–0.2)

## 2020-10-08 LAB — RETICULOCYTES
Immature Retic Fract: 5 % (ref 2.3–15.9)
RBC.: 3.6 MIL/uL — ABNORMAL LOW (ref 4.22–5.81)
Retic Count, Absolute: 51.8 10*3/uL (ref 19.0–186.0)
Retic Ct Pct: 1.4 % (ref 0.4–3.1)

## 2020-10-08 LAB — IRON AND TIBC
Iron: 86 ug/dL (ref 45–182)
Saturation Ratios: 28 % (ref 17.9–39.5)
TIBC: 312 ug/dL (ref 250–450)
UIBC: 226 ug/dL

## 2020-10-08 LAB — URINE CULTURE: Culture: 40000 — AB

## 2020-10-08 LAB — MAGNESIUM: Magnesium: 2.2 mg/dL (ref 1.7–2.4)

## 2020-10-08 LAB — FERRITIN: Ferritin: 112 ng/mL (ref 24–336)

## 2020-10-08 LAB — PHOSPHORUS: Phosphorus: 4.4 mg/dL (ref 2.5–4.6)

## 2020-10-08 LAB — FOLATE: Folate: 11 ng/mL (ref >5.9)

## 2020-10-08 LAB — VITAMIN B12: Vitamin B-12: 256 pg/mL (ref 180–914)

## 2020-10-08 MED ORDER — FUROSEMIDE 10 MG/ML IJ SOLN
60.0000 mg | Freq: Two times a day (BID) | INTRAMUSCULAR | Status: DC
  Administered 2020-10-08 (×2): 60 mg via INTRAVENOUS
  Filled 2020-10-08 (×2): qty 6

## 2020-10-08 MED ORDER — CLONIDINE HCL 0.2 MG PO TABS
0.2000 mg | ORAL_TABLET | Freq: Three times a day (TID) | ORAL | Status: DC
  Administered 2020-10-08 – 2020-10-09 (×4): 0.2 mg via ORAL
  Filled 2020-10-08 (×4): qty 1

## 2020-10-08 NOTE — Plan of Care (Signed)

## 2020-10-08 NOTE — Progress Notes (Signed)
Nephrology Follow-Up Consult note   Assessment/Recommendations: Ian Gregory. is a/an 55 y.o. 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  Historically difficult compliance.  Urine sediment yesterday with pyuria and nondysmorphic hematuria.  Could represent ongoing interstitial nephritis but given his significant CKD no role for immunosuppression at this time.  We discussed that he may require dialysis during this hospitalization.  He is struggling with coming to this realization.  We will continue conversations and monitor throughout the weekend -Monitor kidney function daily -Continue discussions regarding dialysis -If creatinine not improving on Monday likely plan for access placement and BVS consult -continue management of BP per primary -continue diuretics  Severe Symptomatic Hypertension  Severe difficult to control hypertension.  Increase clonidine to 0.2 mg 3 times daily and increase Lasix to 60 mg twice daily.  Continue current regimen otherwise.    Hematuria Nondysmorphic.  Could be related to granulomatous inflammation.  But no likely glomerular bleeding.  Acute on chronic diastolic CHF exacerbation 2/2 severe symptomatic hypertension Echo performed, severe concentric LVH. Suspected to be secondary to persistent hypertension. Continue diuretic therapy as patient still appears hypervolemic on exam and continue to improve BP -continue diuretics and monitor kidney function/electrolytes -once euvolemic, consider stopping diuretics  Secondary hyperparathyroidism:  PTH 148 in 05/2020. Calcium and phosphorus stable.   Anemia of CKD: Hemoglobin stable, continue to monitor  Pulmonary sarcoidosis Follow up with pulmonologist on outpatient basis.  Recommendations conveyed to primary service.    [facility] 10/08/2020 9:47 AM  ___________________________________________________________  CC: AKI on CKD  Interval History/Subjective: Patient states he feels fine today.  No significant changes.  Blood pressure remains elevated.  We discussed potential need for dialysis during this hospitalization.  Patient hesitant but contemplative.   Medications:  Current Facility-Administered Medications  Medication Dose Route Frequency Provider Last Rate Last Admin  . acetaminophen (TYLENOL) tablet 650 mg  650 mg Oral Q6H PRN Cox, Amy N, DO   650 mg at 10/06/20 9767   Or  . acetaminophen (TYLENOL) suppository 650 mg  650 mg Rectal Q6H PRN Cox, Amy N, DO      . carvedilol (COREG) tablet 25 mg  25 mg Oral BID WC Cox, Amy N, DO   25 mg at 10/08/20 0800  . cloNIDine (CATAPRES) tablet 0.2 mg  0.2 mg Oral Q8H Reesa Chew, MD   0.2 mg at 10/08/20 0800  . furosemide (LASIX) injection 60 mg  60 mg Intravenous BID Reesa Chew, MD   60 mg at 10/08/20 0800  . heparin injection 5,000 Units  5,000 Units Subcutaneous Q8H Cox, Amy N, DO   5,000 Units at 10/08/20 0553  . hydrALAZINE (APRESOLINE) injection 10 mg  10 mg Intravenous Q4H PRN Raiford Noble [place], DO   10 mg at 10/08/20 3419  . isosorbide-hydrALAZINE (BIDIL) 20-37.5 MG per tablet 1 tablet  1 tablet Oral BID Cox, Amy N, DO   1 tablet at 10/08/20 0800  . labetalol (NORMODYNE) injection 10 mg  10 mg Intravenous Q2H PRN Raiford Noble [place], DO   10 mg at 10/08/20 3790  . ondansetron (ZOFRAN) tablet 4 mg  4 mg Oral Q6H PRN Cox, Amy N, DO       Or  . ondansetron (ZOFRAN) injection 4 mg  4 mg Intravenous Q6H PRN Cox, Amy N, DO  Review of Systems: 10 systems reviewed and negative except per interval history/subjective  Physical Exam: Vitals:   10/08/20 0553 10/08/20 0728  BP: (!) 183/124 (!) 189/139  Pulse: 78 74  Resp:  18  Temp:  97.9 F (36.6 C)  SpO2:  96%   Total I/O In: -  Out: 450 [Urine:450]  Intake/Output Summary  (Last 24 hours) at 10/08/2020 0947 Last data filed at 10/08/2020 3009 Gross per 24 hour  Intake 600 ml  Output 2350 ml  Net -1750 ml   Constitutional: well-appearing, no acute distress ENMT: ears and nose without scars or lesions, MMM CV: normal rate, trace edema in the bilateral lower extremities Respiratory: clear to auscultation, normal work of breathing Gastrointestinal: soft, non-tender, no palpable masses or hernias Skin: no visible lesions or rashes Psych: alert, judgement/insight appropriate, appropriate mood and affect   Test Results I personally reviewed new and old clinical labs and radiology tests Lab Results  Component Value Date   NA 139 10/08/2020   K 4.1 10/08/2020   CL 110 10/08/2020   CO2 20 (L) 10/08/2020   BUN 53 (H) 10/08/2020   CREATININE 6.01 (H) 10/08/2020   GFR 71.39 08/20/2013   CALCIUM 8.7 (L) 10/08/2020   ALBUMIN 2.9 (L) 10/08/2020   PHOS 4.4 10/08/2020

## 2020-10-08 NOTE — Progress Notes (Signed)
PROGRESS NOTE    Ian Gregory.  XNA:[id number] DOB: 12-04-1965 DOA: 10/05/2020 PCP: [facility]   Brief Narrative:  The patient is a 55 year old obese African-American male with a past medical history significant for but not limited to hypertension, CKD stage IV, history of diastolic CHF, sarcoidosis, as well as other comorbidities who presented to the emergency room with a chief concern of shortness of breath.  He reports that the shortness of breath and going on for the last month or so is worse with exertion and states that he is not able to lay flat and has to have 3 pillows being propped up to sleep.  He presented to his PCP on 10/05/2020 and was told to go to the ED given his elevated blood pressure.  He reported shortness of breath that has been going on for about a month and states has been difficult for him to obtain an appointment to see his PCP.  He also endorses bilateral lower extremity edema that has been going on for years but acutely worsened.  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    **Interim History  Blood pressure still remains elevated and he still appears a little hypervolemic so we will continue IV diuretics and has been increased to IV 60 by nephrology.  Nephrology has made some adjustments in his blood pressure medications as well.  Nephrology recommended continue IV diuresis today and continue monitor renal function carefully as his renal function as it is going up and they feel that the patient may need dialysis this hospitalization.  They are working up his hematuria and sending a urine culture and analyze any microscopy and we may need to discuss with urology.  Patient Is off the diltiazem drip now  Assessment & Plan:   Principal Problem:   Hypertensive emergency Active Problems:   Accelerated hypertension   Acute renal failure superimposed on stage 3 chronic kidney  disease (HCC)   Anemia of chronic renal failure, stage 3 (moderate) (HCC)   ARF (acute renal failure) (HCC)  Hypertensive emergency/urgency Accelerated hypertension -Initiated onDiltiazem gtt but will attempt to wean and is now off of the diltiazem drip -Given a Dose of IV Lasix 80 mg x1 and now has been changed to IV Lasix 40 mg every 12 but now -Resumed home antihypertensives: Isosorbide hydralazine 20-30 7.5 twice daily, Coreg 25 mg twice daily; nephrology is added clonidine 0.1 mg p.o. 3 times daily to 0.2 mg p.o. 3 times daily -With as needed antihypertensives with labetalol 10 mg IV every 2 as needed for systolic blood pressure in 542 or diastolic blood pressure greater 100 in addition to IV hydralazine 10 mg every 4 as needed's for SBP greater than 180 or DBP within 100 -Complete echo ordered and is as below -Continue to Monitor BP per Protocol  -Last blood pressure elevated and was 153/107 and may need further medication adjustments  Acute on Chronic Diastolic CHF -last echocardiogram was done in 2018 and showed a normal EF of 55 to 60% with grade 1 diastolic dysfunction -Repeat echocardiogram this time and it showed severe concentric LVH as well as a left ventricular EF of 50 to 55% and grade 2 diastolic dysfunction -BNP was 441.9 and patient is unable to lie flat and has dyspnea on exertion along with pitting edema -He was started on diuresis with IV Lasix and nephrology is increased this to IV 60 mg every  12 today -Strict I's and O's and daily weights; the patient is -3.337 L since admission and weight is down 15 lbs  -Nephrology consulted for further assistance and evaluation given his renal dysfunction -Patient was weaned off of diltiazem drip and will need to continue monitor his renal function carefully as he is being diuresed  Acute Kidney Injury on CKD stage IV-suspect secondary to hypertensive emergency, worsening  Metabolic Acidosis 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 -BUN/creatinine went from 48/5.26 -> 45/5.19 -> 48/5.52 -> 53/6.01 -Patient has some mild metabolic acidosis with a CO2 of 20, anion gap of 9, chloride level of 110 -Nephrology feels the patient is still hypervolemic on examination and recommending continue IV diuretics today and he is currently on IV 60 mg q12h -This is not needed at this time and recommending to continue to monitor fluid status carefully and hold Lasix once he is euvolemic but they feel that he has had historically difficult compliance and because the patient urine sediment yesterday with prior nondysmorphic hematuria they could represent ongoing interstitial nephritis but given his significant CKD they do not feel that immunosuppression at this time is indicated. -Already has discussed that he may require dialysis during his hospitalization and that if his creatinine is not improving on Monday and the plan is likely for access placement and vascular consultation -PTH was 148 back in January of this year; Ca2+ was 8.7 and Phos was 4.2  -Renal ultrasound done and showed no evidence of medical renal disease and no obstruction -Continue to monitor blood pressure carefully -Avoid nephrotoxic medications, contrast dyes, hypotension and renally dose medications -Repeat CMP in a.m.  Normocytic Anemia/Anemia of chronic kidney disease -Patient's Hgb/Hct went from 12.1/37.1 -> 12.2/37.7 -> 11.1/34.0 -Check Anemia panel in the a.m. -Continue monitor for signs and symptoms bleeding; include neuro overt bleeding noted  -Repeat CBC in a.m.  History of hematuria and suprapubic abdominal pain-patient urine was negative for hematuria during this hospitalization -If persists with recommend a.m. team to order a CT stone chaser to assess for kidney stones -U/A done and showed moderate hemoglobin on urine dipstick, small leukocytes, negative nitrites, greater than 300 protein,  no bacteria seen, 21-50 RBCs per high-powered field, 0-5 squamous epithelial cells, greater than 50 WBCs -Urine culture is being checked and there is minimum and analysis microscopy -May need to speak with urology but nephrology believes this is nondysmorphic and could be related to granulomatous inflammation but no glomerular bleeding  Pulmonary Sarcoidosis -CXR this AM showed "There is atelectatic change in the left lower lung region. There is no edema or airspace opacity. There is cardiomegaly with pulmonary vascularity normal. No adenopathy. No bone lesions." -He will need outpatient follow-up with his Pulmonologist  Obesity -Complicates overall prognosis and care -Estimated body mass index is 32.77 kg/m as calculated from the following:   Height as of this encounter: 6\' 3"  (1.905 m).   Weight as of this encounter: 118.9 kg. -Weight Loss and Dietary Counseling given   DVT prophylaxis: Heparin 5,000 units sq q8h  Code Status: FULL CODE  Family Communication: No family present at bedside  Disposition Plan: Pending further clinical improvement and clearance by Nephrology Status is: Inpatient  Remains inpatient appropriate because:Unsafe d/c plan, IV treatments appropriate due to intensity of illness or inability to take PO and Inpatient level of care appropriate due to severity of illness   Dispo: The patient is from: Home  Anticipated d/c is to: Home              Patient currently is not medically stable to d/c.   Difficult to place patient No  Consultants:   Nephrology   Procedures:  ECHOCARDIOGRAM IMPRESSIONS    1. Severe concentric LVH is present. Given the patient's history of  poorly controlled hypertension, favors a diagnosis of hypertensive heart  disease.  2. Left ventricular ejection fraction, by estimation, is 55 to 60%. The  left ventricle has normal function. The left ventricle has no regional  wall motion abnormalities. There is severe  concentric left ventricular  hypertrophy. Left ventricular diastolic  parameters are consistent with Grade II diastolic dysfunction  (pseudonormalization). Elevated left atrial pressure.  3. Right ventricular systolic function is normal. The right ventricular  size is normal. Tricuspid regurgitation signal is inadequate for assessing  PA pressure.  4. The mitral valve is grossly normal. Mild mitral valve regurgitation.  No evidence of mitral stenosis.  5. The aortic valve is tricuspid. Aortic valve regurgitation is trivial.  Mild aortic valve sclerosis is present, with no evidence of aortic valve  stenosis.  6. Aortic dilatation noted. There is mild dilatation of the aortic root,  measuring 40 mm.  7. The inferior vena cava is normal in size with greater than 50%  respiratory variability, suggesting right atrial pressure of 3 mmHg.   FINDINGS  Left Ventricle: Left ventricular ejection fraction, by estimation, is 55  to 60%. The left ventricle has normal function. The left ventricle has no  regional wall motion abnormalities. The left ventricular internal cavity  size was normal in size. There is  severe concentric left ventricular hypertrophy. Left ventricular  diastolic parameters are consistent with Grade II diastolic dysfunction  (pseudonormalization). Elevated left atrial pressure.   Right Ventricle: The right ventricular size is normal. No increase in  right ventricular wall thickness. Right ventricular systolic function is  normal. Tricuspid regurgitation signal is inadequate for assessing PA  pressure.   Left Atrium: Left atrial size was normal in size.   Right Atrium: Right atrial size was normal in size.   Pericardium: Trivial pericardial effusion is present.   Mitral Valve: The mitral valve is grossly normal. Mild mitral valve  regurgitation. No evidence of mitral valve stenosis.   Tricuspid Valve: The tricuspid valve is grossly normal. Tricuspid valve   regurgitation is trivial. No evidence of tricuspid stenosis.   Aortic Valve: The aortic valve is tricuspid. Aortic valve regurgitation is  trivial. Mild aortic valve sclerosis is present, with no evidence of  aortic valve stenosis. Aortic valve mean gradient measures 5.0 mmHg.  Aortic valve peak gradient measures 9.5  mmHg. Aortic valve area, by VTI measures 2.63 cm.   Pulmonic Valve: The pulmonic valve was grossly normal. Pulmonic valve  regurgitation is trivial. No evidence of pulmonic stenosis.   Aorta: Aortic dilatation noted. There is mild dilatation of the aortic  root, measuring 40 mm.   Venous: The inferior vena cava is normal in size with greater than 50%  respiratory variability, suggesting right atrial pressure of 3 mmHg.   IAS/Shunts: The atrial septum is grossly normal.     LEFT VENTRICLE  PLAX 2D  LVIDd:     4.30 cm Diastology  LVIDs:     2.90 cm LV e' medial:  4.79 cm/s  LV PW:     2.40 cm LV E/e' medial: 23.4  LV IVS:    2.30 cm LV e' lateral:  5.98 cm/s  LVOT diam:   2.30 cm LV E/e' lateral: 18.7  LV SV:     72  LV SV Index:  29  LVOT Area:   4.15 cm     RIGHT VENTRICLE       IVC  RV Basal diam: 4.30 cm   IVC diam: 1.40 cm  RV S prime:   14.60 cm/s   LEFT ATRIUM       Index    RIGHT ATRIUM      Index  LA diam:    4.10 cm 1.66 cm/m RA Area:   23.60 cm  LA Vol (A2C):  83.2 ml 33.70 ml/m RA Volume:  72.70 ml 29.44 ml/m  LA Vol (A4C):  78.9 ml 31.95 ml/m  LA Biplane Vol: 81.9 ml 33.17 ml/m  AORTIC VALVE  AV Area (Vmax):  2.75 cm  AV Area (Vmean):  2.56 cm  AV Area (VTI):   2.63 cm  AV Vmax:      154.00 cm/s  AV Vmean:     105.500 cm/s  AV VTI:      0.274 m  AV Peak Grad:   9.5 mmHg  AV Mean Grad:   5.0 mmHg  LVOT Vmax:     102.00 cm/s  LVOT Vmean:    65.100 cm/s  LVOT VTI:     0.174 m  LVOT/AV VTI ratio: 0.63    AORTA  Ao  Root diam: 4.00 cm  Ao Asc diam: 3.90 cm   MITRAL VALVE  MV Area (PHT): 3.17 cm   SHUNTS  MV Decel Time: 239 msec   Systemic VTI: 0.17 m  MV E velocity: 112.00 cm/s Systemic Diam: 2.30 cm  MV A velocity: 75.30 cm/s  MV E/A ratio: 1.49   Antimicrobials:  Anti-infectives (From admission, onward)   None        Subjective: Seen and examined at bedside and his shortness of breath is a little bit better but still has some volume overload.  Has been ambulating now.  No nausea or vomiting.  Renal function continues to worsen and nephrology is increased with diuresis.  They are starting dialysis discussions in case his creatinine does not improve.  No other concerns or complaints at this time.  Objective: Vitals:   10/08/20 0728 10/08/20 1126 10/08/20 1222 10/08/20 1600  BP: (!) 189/139 (!) 143/103 (!) 175/112 (!) 153/107  Pulse: 74 71 73 70  Resp: 18 18 18 16   Temp: 97.9 F (36.6 C)  97.6 F (36.4 C) 97.8 F (36.6 C)  TempSrc: Oral  Oral Oral  SpO2: 96% 98% 96% 97%  Weight:      Height:        Intake/Output Summary (Last 24 hours) at 10/08/2020 1604 Last data filed at 10/08/2020 1224 Gross per 24 hour  Intake 480 ml  Output 1850 ml  Net -1370 ml   Filed Weights   10/05/20 1821 10/06/20 0245 10/08/20 0300  Weight: 125.6 kg 119.7 kg 118.9 kg   Examination: Physical Exam:  Constitutional: WN/WD obese AAM in NAD appears calm but slightly frustrated Eyes: Lids and conjunctivae normal, sclerae anicteric  ENMT: External Ears, Nose appear normal. Grossly normal hearing.  Neck: Appears normal, supple, no cervical masses, normal ROM, no appreciable thyromegaly; no JVD Respiratory: Diminished to auscultation bilaterally with coarse breath sounds, no wheezing, rales, rhonchi or crackles. Normal respiratory effort and patient is not tachypenic. No accessory muscle use. Unlabored breathing  Cardiovascular: RRR, no murmurs / rubs / gallops. S1 and S2  auscultated. 1+ LE  edema Abdomen: Soft, non-tender, Distended 2/2 body habitus. Bowel sounds positive.  GU: Deferred. Musculoskeletal: No clubbing / cyanosis of digits/nails. No joint deformity upper and lower extremities.  Skin: No rashes, lesions, ulcers on a limited skin. No induration; Warm and dry.  Neurologic: CN 2-12 grossly intact with no focal deficits. Romberg sign and cerebellar reflexes not assessed.  Psychiatric: Normal judgment and insight. Alert and oriented x 3. Slightly frustrated mood and appropriate affect.   Data Reviewed: I have personally reviewed following labs and imaging studies  CBC: Recent Labs  Lab 10/05/20 1949 10/06/20 0336 10/07/20 0320 10/08/20 0144  WBC 5.5 5.5 5.3 5.4  NEUTROABS 3.1  --  3.2 3.1  HGB 12.1* 12.2* 11.1* 10.9*  HCT 37.1* 37.7* 34.0* 33.7*  MCV 93.9 95.0 94.4 94.1  PLT 147* 156 155 938   Basic Metabolic Panel: Recent Labs  Lab 10/05/20 1949 10/06/20 0336 10/07/20 0320 10/08/20 0144  NA 140 141 137 139  K 4.7 4.2 4.1 4.1  CL 111 111 107 110  CO2 19* 21* 20* 20*  GLUCOSE 101* 102* 105* 103*  BUN 48* 45* 48* 53*  CREATININE 5.26* 5.19* 5.52* 6.01*  CALCIUM 8.6* 9.3 8.7* 8.7*  MG  --   --  2.2 2.2  PHOS  --   --  4.2 4.4   GFR: Estimated Creatinine Clearance: 19.5 mL/min (A) (by C-G formula based on SCr of 6.01 mg/dL (H)). Liver Function Tests: Recent Labs  Lab 10/05/20 1949 10/07/20 0320 10/08/20 0144  AST 20 16 15   ALT 17 16 15   ALKPHOS 78 70 69  BILITOT 0.8 1.2 1.1  PROT 6.7 6.1* 6.3*  ALBUMIN 3.6 2.9* 2.9*   No results for input(s): LIPASE, AMYLASE in the last 168 hours. No results for input(s): AMMONIA in the last 168 hours. Coagulation Profile: Recent Labs  Lab 10/06/20 0347  INR 1.1   Cardiac Enzymes: No results for input(s): CKTOTAL, CKMB, CKMBINDEX, TROPONINI in the last 168 hours. BNP (last 3 results) No results for input(s): PROBNP in the last 8760 hours. HbA1C: No results for input(s): HGBA1C in the last 72  hours. CBG: No results for input(s): GLUCAP in the last 168 hours. Lipid Profile: No results for input(s): CHOL, HDL, LDLCALC, TRIG, CHOLHDL, LDLDIRECT in the last 72 hours. Thyroid Function Tests: No results for input(s): TSH, T4TOTAL, FREET4, T3FREE, THYROIDAB in the last 72 hours. Anemia Panel: Recent Labs    10/08/20 0144  VITAMINB12 256  FOLATE 11.0  FERRITIN 112  TIBC 312  IRON 86  RETICCTPCT 1.4   Sepsis Labs: No results for input(s): PROCALCITON, LATICACIDVEN in the last 168 hours.  Recent Results (from the past 240 hour(s))  Resp Panel by RT-PCR (Flu A&B, Covid) Nasopharyngeal Swab     Status: None   Collection Time: 10/05/20  9:24 PM   Specimen: Nasopharyngeal Swab; Nasopharyngeal(NP) swabs in vial transport medium  Result Value Ref Range Status   SARS Coronavirus 2 by RT PCR NEGATIVE NEGATIVE Final    Comment: (NOTE) SARS-CoV-2 target nucleic acids are NOT DETECTED.  The SARS-CoV-2 RNA is generally detectable in upper respiratory specimens during the acute phase of infection. The lowest concentration of SARS-CoV-2 viral copies this assay can detect is 138 copies/mL. A negative result does not preclude SARS-Cov-2 infection and should not be used as the sole basis for treatment or other patient management decisions. A negative result may occur with  improper specimen collection/handling, submission of specimen other than nasopharyngeal  swab, presence of viral mutation(s) within the areas targeted by this assay, and inadequate number of viral copies(<138 copies/mL). A negative result must be combined with clinical observations, patient history, and epidemiological information. The expected result is Negative.  Fact Sheet for Patients:  EntrepreneurPulse.com.au  Fact Sheet for Healthcare Providers:  IncredibleEmployment.be  This test is no t yet approved or cleared by the [place] FDA and  has been authorized for  detection and/or diagnosis of SARS-CoV-2 by FDA under an Emergency Use Authorization (EUA). This EUA will remain  in effect (meaning this test can be used) for the duration of the COVID-19 declaration under Section 564(b)(1) of the Act, 21 U.S.C.section 360bbb-3(b)(1), unless the authorization is terminated  or revoked sooner.       Influenza A by PCR NEGATIVE NEGATIVE Final   Influenza B by PCR NEGATIVE NEGATIVE Final    Comment: (NOTE) The Xpert Xpress SARS-CoV-2/FLU/RSV plus assay is intended as an aid in the diagnosis of influenza from Nasopharyngeal swab specimens and should not be used as a sole basis for treatment. Nasal washings and aspirates are unacceptable for Xpert Xpress SARS-CoV-2/FLU/RSV testing.  Fact Sheet for Patients: EntrepreneurPulse.com.au  Fact Sheet for Healthcare Providers: IncredibleEmployment.be  This test is not yet approved or cleared by the [place] FDA and has been authorized for detection and/or diagnosis of SARS-CoV-2 by FDA under an Emergency Use Authorization (EUA). This EUA will remain in effect (meaning this test can be used) for the duration of the COVID-19 declaration under Section 564(b)(1) of the Act, 21 U.S.C. section 360bbb-3(b)(1), unless the authorization is terminated or revoked.  Performed at [facility], [address] [place], [place] [address]   Culture, Urine     Status: Abnormal   Collection Time: 10/07/20 10:20 AM   Specimen: Urine, Clean Catch  Result Value Ref Range Status   Specimen Description URINE, CLEAN CATCH  Final   Special Requests   Final    NONE Performed at [facility], [place] [address], [place], [place] [address]    Culture 40,000 COLONIES/mL STREPTOCOCCUS MITIS/ORALIS (A)  Final   Report Status 10/08/2020 FINAL  Final    RN Pressure Injury Documentation:     Estimated body mass index is 32.77 kg/m as calculated from the following:    Height as of this encounter: 6\' 3"  (1.905 m).   Weight as of this encounter: 118.9 kg.  Malnutrition Type:   Malnutrition Characteristics:   Nutrition Interventions:   Radiology Studies: US RENAL  Result Date: 10/06/2020 CLINICAL DATA:  Acute renal injury. EXAM: RENAL / URINARY TRACT ULTRASOUND COMPLETE COMPARISON:  None. FINDINGS: Right Kidney: Renal measurements: 8.3 cm x 5.3 cm x 4.0 cm = volume: 93.00 mL. Diffusely increased echogenicity of the renal parenchyma is seen. No mass or hydronephrosis visualized. Left Kidney: Renal measurements: 9.5 cm x 4.7 cm x 4.1 cm = volume: 94.74 mL. Diffusely increased echogenicity of the renal parenchyma is seen. No mass or hydronephrosis visualized. Bladder: Appears normal for degree of bladder distention. Other: Limited study secondary to overlying bowel gas. IMPRESSION: Diffusely increased echogenicity of the renal parenchyma which may be secondary to medical renal disease. Electronically Signed   By: Virgina Norfolk M.D.   On: 10/06/2020 23:20   DG CHEST PORT 1 VIEW  Result Date: 10/07/2020 CLINICAL DATA:  Shortness of breath and chest pain EXAM: PORTABLE CHEST 1 VIEW COMPARISON:  Oct 05, 2020 FINDINGS: There is atelectatic change in the left lower lung region. There  is no edema or airspace opacity. There is cardiomegaly with pulmonary vascularity normal. No adenopathy. No bone lesions. IMPRESSION: No edema or airspace opacity. Left lower lobe atelectasis noted. Stable cardiomegaly. Electronically Signed   By: Lowella Grip III M.D.   On: 10/07/2020 08:23   Scheduled Meds: . carvedilol  25 mg Oral BID WC  . cloNIDine  0.2 mg Oral Q8H  . furosemide  60 mg Intravenous BID  . heparin  5,000 Units Subcutaneous Q8H  . isosorbide-hydrALAZINE  1 tablet Oral BID   Continuous Infusions:   LOS: 2 days   Kerney Elbe, DO [facility] PAGER is on AMION  If 7PM-7AM, please contact night-coverage [email or web address]

## 2020-10-09 LAB — CBC WITH DIFFERENTIAL/PLATELET
Abs Immature Granulocytes: 0.02 10*3/uL (ref 0.00–0.07)
Basophils Absolute: 0 10*3/uL (ref 0.0–0.1)
Basophils Relative: 0 %
Eosinophils Absolute: 0.1 10*3/uL (ref 0.0–0.5)
Eosinophils Relative: 3 %
HCT: 32.3 % — ABNORMAL LOW (ref 39.0–52.0)
Hemoglobin: 10.5 g/dL — ABNORMAL LOW (ref 13.0–17.0)
Immature Granulocytes: 0 %
Lymphocytes Relative: 23 %
Lymphs Abs: 1.2 10*3/uL (ref 0.7–4.0)
MCH: 30.8 pg (ref 26.0–34.0)
MCHC: 32.5 g/dL (ref 30.0–36.0)
MCV: 94.7 fL (ref 80.0–100.0)
Monocytes Absolute: 0.9 10*3/uL (ref 0.1–1.0)
Monocytes Relative: 17 %
Neutro Abs: 3 10*3/uL (ref 1.7–7.7)
Neutrophils Relative %: 57 %
Platelets: 141 10*3/uL — ABNORMAL LOW (ref 150–400)
RBC: 3.41 MIL/uL — ABNORMAL LOW (ref 4.22–5.81)
RDW: 14 % (ref 11.5–15.5)
WBC: 5.2 10*3/uL (ref 4.0–10.5)
nRBC: 0 % (ref 0.0–0.2)

## 2020-10-09 LAB — MAGNESIUM: Magnesium: 2.4 mg/dL (ref 1.7–2.4)

## 2020-10-09 LAB — COMPREHENSIVE METABOLIC PANEL
ALT: 15 U/L (ref 0–44)
AST: 14 U/L — ABNORMAL LOW (ref 15–41)
Albumin: 2.8 g/dL — ABNORMAL LOW (ref 3.5–5.0)
Alkaline Phosphatase: 69 U/L (ref 38–126)
Anion gap: 8 (ref 5–15)
BUN: 62 mg/dL — ABNORMAL HIGH (ref 6–20)
CO2: 22 mmol/L (ref 22–32)
Calcium: 8.5 mg/dL — ABNORMAL LOW (ref 8.9–10.3)
Chloride: 108 mmol/L (ref 98–111)
Creatinine, Ser: 5.69 mg/dL — ABNORMAL HIGH (ref 0.61–1.24)
GFR, Estimated: 11 mL/min — ABNORMAL LOW (ref >60)
Glucose, Bld: 116 mg/dL — ABNORMAL HIGH (ref 70–99)
Potassium: 3.9 mmol/L (ref 3.5–5.1)
Sodium: 138 mmol/L (ref 135–145)
Total Bilirubin: 0.4 mg/dL (ref 0.3–1.2)
Total Protein: 6 g/dL — ABNORMAL LOW (ref 6.5–8.1)

## 2020-10-09 LAB — PHOSPHORUS: Phosphorus: 4.6 mg/dL (ref 2.5–4.6)

## 2020-10-09 MED ORDER — ISOSORB DINITRATE-HYDRALAZINE 20-37.5 MG PO TABS
2.0000 | ORAL_TABLET | Freq: Two times a day (BID) | ORAL | Status: DC
  Administered 2020-10-09 – 2020-10-11 (×5): 2 via ORAL
  Filled 2020-10-09 (×5): qty 2

## 2020-10-09 MED ORDER — ACETAMINOPHEN 325 MG PO TABS
650.0000 mg | ORAL_TABLET | Freq: Four times a day (QID) | ORAL | Status: DC | PRN
  Administered 2020-10-09 – 2020-10-11 (×2): 650 mg via ORAL
  Filled 2020-10-09 (×2): qty 2

## 2020-10-09 MED ORDER — FUROSEMIDE 10 MG/ML IJ SOLN
80.0000 mg | Freq: Two times a day (BID) | INTRAMUSCULAR | Status: DC
  Administered 2020-10-09 – 2020-10-11 (×5): 80 mg via INTRAVENOUS
  Filled 2020-10-09 (×5): qty 8

## 2020-10-09 MED ORDER — CLONIDINE HCL 0.2 MG PO TABS
0.3000 mg | ORAL_TABLET | Freq: Three times a day (TID) | ORAL | Status: DC
  Administered 2020-10-09 – 2020-10-11 (×6): 0.3 mg via ORAL
  Filled 2020-10-09 (×7): qty 1

## 2020-10-09 NOTE — Progress Notes (Signed)
PROGRESS NOTE    Ian Gregory.  PYP:[id number] DOB: 09-Jun-1965 DOA: 10/05/2020 PCP: [facility]   Brief Narrative:  The patient is a 55 year old obese African-American male with a past medical history significant for but not limited to hypertension, CKD stage IV, history of diastolic CHF, sarcoidosis, as well as other comorbidities who presented to the emergency room with a chief concern of shortness of breath.  He reports that the shortness of breath and going on for the last month or so is worse with exertion and states that he is not able to lay flat and has to have 3 pillows being propped up to sleep.  He presented to his PCP on 10/05/2020 and was told to go to the ED given his elevated blood pressure.  He reported shortness of breath that has been going on for about a month and states has been difficult for him to obtain an appointment to see his PCP.  He also endorses bilateral lower extremity edema that has been going on for years but acutely worsened.  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    **Interim History  Blood pressure still remains elevated and he still appears a little hypervolemic so we will continue IV diuretics and has been increased to IV 60 mg BID by nephrology yesterday and to IV 80 mg BID today.  Nephrology has made some adjustments in his blood pressure medications as well again.  Nephrology recommended continue IV diuresis today and continue monitor renal function carefully as his renal function as it is going up and they feel that the patient may need dialysis this hospitalization.  They are working up his hematuria and sending a urine culture and analyze any microscopy and we may need to discuss with urology. Urine Cx showing 40,000 CFU of Streptococus Mitis/Oralis. Patient Is off the diltiazem drip now  Assessment & Plan:   Principal Problem:   Hypertensive  emergency Active Problems:   Accelerated hypertension   Acute renal failure superimposed on stage 3 chronic kidney disease (HCC)   Anemia of chronic renal failure, stage 3 (moderate) (HCC)   ARF (acute renal failure) (HCC)  Hypertensive emergency/urgency Accelerated hypertension -Initiated onDiltiazem gtt but will attempt to wean and is now off of the diltiazem drip -Given a Dose of IV Lasix 80 mg x1 and now has been changed to IV Lasix 40 mg every 12 but now increased to IV 60 mg BID yesterday and to IV 80 mg BID today  -Resumed home antihypertensives: Isosorbide hydralazine 20-30 7.5 twice daily, Coreg 25 mg twice daily; nephrology is added clonidine 0.1 mg p.o. 3 times daily and this has been 0.2 mg p.o. 3 times daily yesterday and to 0.3 mg po TID today  -With as needed antihypertensives with labetalol 10 mg IV every 2 as needed for systolic blood pressure in 245 or diastolic blood pressure greater 100 in addition to IV hydralazine 10 mg every 4 as needed's for SBP greater than 180 or DBP within 100 -Complete echo ordered and is as below -Continue to Monitor BP per Protocol  -Last blood pressure elevated and was 141/93 and may need further medication adjustments  Acute on Chronic Diastolic CHF -last echocardiogram was done in 2018 and showed a normal EF of 55 to 60% with grade 1 diastolic dysfunction -Repeat echocardiogram this time and it showed severe concentric LVH as well as a left ventricular EF of  50 to 55% and grade 2 diastolic dysfunction -BNP was 441.9 and patient is unable to lie flat and has dyspnea on exertion along with pitting edema -He was started on diuresis with IV Lasix and nephrology is increased this to IV 60 mg every 12 yesterday and to IV 80 mg BID today  -Strict I's and O's and daily weights; the patient is -4.767 L since admission and weight is down 11 lbs  -Nephrology consulted for further assistance and evaluation given his renal dysfunction -Patient was weaned  off of diltiazem drip and will need to continue monitor his renal function carefully as he is being diuresed  Acute Kidney Injury on CKD stage IV-suspect secondary to hypertensive emergency, worsening  Metabolic Acidosis 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 -BUN/creatinine went from 48/5.26 -> 45/5.19 -> 48/5.52 -> 53/6.01 -> 62/5.69 -Patient has some mild metabolic acidosis with a CO2 of 20, anion gap of 9, chloride level of 110 -Nephrology feels the patient is still hypervolemic on examination and recommending continue IV diuretics today and he is currently on IV 80 mg q12h -Nephrology following and recommending to continue to monitor fluid status carefully and hold Lasix once he is euvolemic but they feel that he has had historically difficult compliance and because the patient urine sediment yesterday with prior nondysmorphic hematuria they could represent ongoing interstitial nephritis but given his significant CKD they do not feel that immunosuppression at this time is indicated. -Already has discussed that he may require dialysis during his hospitalization and that if his creatinine is not improving on Monday and the plan is likely for access placement and vascular consultation -PTH was 148 back in January of this year; Ca2+ was 8.7 and Phos was 4.2  -Renal ultrasound done and showed no evidence of medical renal disease and no obstruction -Continue to monitor blood pressure carefully -Avoid nephrotoxic medications, contrast dyes, hypotension and renally dose medications -Repeat CMP in a.m.  Normocytic Anemia/Anemia of chronic kidney disease -Patient's Hgb/Hct went from 12.1/37.1 -> 12.2/37.7 -> 11.1/34.0 -> 10.9/33.7 -> 10.5/32.3 -Checked Anemia Panel and showed iron level of 86, U IBC 226, TIBC 312, saturation ratios of 28%, ferritin of 112, folate level of 11.0, vitamin B12 256 -Continue monitor for signs and symptoms  bleeding; Currently no overt bleeding noted  -Repeat CBC in a.m.  History of hematuria and suprapubic abdominal pain-patient urine was negative for hematuria during this hospitalization -If persists with recommend a.m. team to order a CT stone chaser to assess for kidney stones -U/A done and showed moderate hemoglobin on urine dipstick, small leukocytes, negative nitrites, greater than 300 protein, no bacteria seen, 21-50 RBCs per high-powered field, 0-5 squamous epithelial cells, greater than 50 WBCs -Urine culture is being checked and there is minimum and analysis microscopy -May need to speak with urology but nephrology believes this is nondysmorphic and could be related to granulomatous inflammation but no glomerular bleeding -Urine culture revealed 40,000 colony-forming units of Streptococcus Mitis/Oralis  Pulmonary Sarcoidosis -CXR this AM showed "There is atelectatic change in the left lower lung region. There is no edema or airspace opacity. There is cardiomegaly with pulmonary vascularity normal. No adenopathy. No bone lesions." -He will need outpatient follow-up with his Pulmonologist  Obesity -Complicates overall prognosis and care -Estimated body mass index is 33.31 kg/m as calculated from the following:   Height as of this encounter: 6\' 3"  (1.905 m).   Weight as of this encounter: 120.9 kg. -Weight Loss and Dietary Counseling given  DVT prophylaxis: Heparin 5,000 units sq q8h  Code Status: FULL CODE  Family Communication: No family present at bedside  Disposition Plan: Pending further clinical improvement and clearance by Nephrology  Status is: Inpatient  Remains inpatient appropriate because:Unsafe d/c plan, IV treatments appropriate due to intensity of illness or inability to take PO and Inpatient level of care appropriate due to severity of illness   Dispo: The patient is from: Home              Anticipated d/c is to: Home              Patient currently is not  medically stable to d/c.   Difficult to place patient No  Consultants:   Nephrology   Procedures:  ECHOCARDIOGRAM IMPRESSIONS    1. Severe concentric LVH is present. Given the patient's history of  poorly controlled hypertension, favors a diagnosis of hypertensive heart  disease.  2. Left ventricular ejection fraction, by estimation, is 55 to 60%. The  left ventricle has normal function. The left ventricle has no regional  wall motion abnormalities. There is severe concentric left ventricular  hypertrophy. Left ventricular diastolic  parameters are consistent with Grade II diastolic dysfunction  (pseudonormalization). Elevated left atrial pressure.  3. Right ventricular systolic function is normal. The right ventricular  size is normal. Tricuspid regurgitation signal is inadequate for assessing  PA pressure.  4. The mitral valve is grossly normal. Mild mitral valve regurgitation.  No evidence of mitral stenosis.  5. The aortic valve is tricuspid. Aortic valve regurgitation is trivial.  Mild aortic valve sclerosis is present, with no evidence of aortic valve  stenosis.  6. Aortic dilatation noted. There is mild dilatation of the aortic root,  measuring 40 mm.  7. The inferior vena cava is normal in size with greater than 50%  respiratory variability, suggesting right atrial pressure of 3 mmHg.   FINDINGS  Left Ventricle: Left ventricular ejection fraction, by estimation, is 55  to 60%. The left ventricle has normal function. The left ventricle has no  regional wall motion abnormalities. The left ventricular internal cavity  size was normal in size. There is  severe concentric left ventricular hypertrophy. Left ventricular  diastolic parameters are consistent with Grade II diastolic dysfunction  (pseudonormalization). Elevated left atrial pressure.   Right Ventricle: The right ventricular size is normal. No increase in  right ventricular wall thickness. Right  ventricular systolic function is  normal. Tricuspid regurgitation signal is inadequate for assessing PA  pressure.   Left Atrium: Left atrial size was normal in size.   Right Atrium: Right atrial size was normal in size.   Pericardium: Trivial pericardial effusion is present.   Mitral Valve: The mitral valve is grossly normal. Mild mitral valve  regurgitation. No evidence of mitral valve stenosis.   Tricuspid Valve: The tricuspid valve is grossly normal. Tricuspid valve  regurgitation is trivial. No evidence of tricuspid stenosis.   Aortic Valve: The aortic valve is tricuspid. Aortic valve regurgitation is  trivial. Mild aortic valve sclerosis is present, with no evidence of  aortic valve stenosis. Aortic valve mean gradient measures 5.0 mmHg.  Aortic valve peak gradient measures 9.5  mmHg. Aortic valve area, by VTI measures 2.63 cm.   Pulmonic Valve: The pulmonic valve was grossly normal. Pulmonic valve  regurgitation is trivial. No evidence of pulmonic stenosis.   Aorta: Aortic dilatation noted. There is mild dilatation of the aortic  root, measuring 40 mm.   Venous: The inferior vena  cava is normal in size with greater than 50%  respiratory variability, suggesting right atrial pressure of 3 mmHg.   IAS/Shunts: The atrial septum is grossly normal.     LEFT VENTRICLE  PLAX 2D  LVIDd:     4.30 cm Diastology  LVIDs:     2.90 cm LV e' medial:  4.79 cm/s  LV PW:     2.40 cm LV E/e' medial: 23.4  LV IVS:    2.30 cm LV e' lateral:  5.98 cm/s  LVOT diam:   2.30 cm LV E/e' lateral: 18.7  LV SV:     72  LV SV Index:  29  LVOT Area:   4.15 cm     RIGHT VENTRICLE       IVC  RV Basal diam: 4.30 cm   IVC diam: 1.40 cm  RV S prime:   14.60 cm/s   LEFT ATRIUM       Index    RIGHT ATRIUM      Index  LA diam:    4.10 cm 1.66 cm/m RA Area:   23.60 cm  LA Vol (A2C):  83.2 ml 33.70 ml/m RA Volume:  72.70 ml  29.44 ml/m  LA Vol (A4C):  78.9 ml 31.95 ml/m  LA Biplane Vol: 81.9 ml 33.17 ml/m  AORTIC VALVE  AV Area (Vmax):  2.75 cm  AV Area (Vmean):  2.56 cm  AV Area (VTI):   2.63 cm  AV Vmax:      154.00 cm/s  AV Vmean:     105.500 cm/s  AV VTI:      0.274 m  AV Peak Grad:   9.5 mmHg  AV Mean Grad:   5.0 mmHg  LVOT Vmax:     102.00 cm/s  LVOT Vmean:    65.100 cm/s  LVOT VTI:     0.174 m  LVOT/AV VTI ratio: 0.63    AORTA  Ao Root diam: 4.00 cm  Ao Asc diam: 3.90 cm   MITRAL VALVE  MV Area (PHT): 3.17 cm   SHUNTS  MV Decel Time: 239 msec   Systemic VTI: 0.17 m  MV E velocity: 112.00 cm/s Systemic Diam: 2.30 cm  MV A velocity: 75.30 cm/s  MV E/A ratio: 1.49   Antimicrobials:  Anti-infectives (From admission, onward)   None        Subjective: Seen and examined at bedside thinks he is doing okay.  Had a headache.  No nausea or vomiting.  Appears a little depressed about his situation and possibly getting dialysis.  Shortness of breath is improved and denies any chest pain.  No other concerns or plans at this time.  Objective: Vitals:   10/09/20 0020 10/09/20 0424 10/09/20 0738 10/09/20 1215  BP: (!) 154/116 (!) 191/137 (!) 182/133 (!) 141/93  Pulse: 73 75 76 69  Resp: 18 18 16 14   Temp: 98.7 F (37.1 C) 98 F (36.7 C) 97.8 F (36.6 C) 97.7 F (36.5 C)  TempSrc: Oral Oral Oral Oral  SpO2: 99% 97% 97% 98%  Weight:  120.9 kg    Height:        Intake/Output Summary (Last 24 hours) at 10/09/2020 1556 Last data filed at 10/09/2020 1423 Gross per 24 hour  Intake 920 ml  Output 2350 ml  Net -1430 ml   Filed Weights   10/06/20 0245 10/08/20 0300 10/09/20 0424  Weight: 119.7 kg 118.9 kg 120.9 kg   Examination: Physical Exam:  Constitutional: WN/WD obese AAM in NAD  and appears calm but slightly depressed appearing Eyes: Lids and conjunctivae normal, sclerae anicteric  ENMT: External Ears, Nose appear normal.  Grossly normal hearing.  Neck: Appears normal, supple, no cervical masses, normal ROM, no appreciable thyromegaly; no JVD Respiratory: Diminished to auscultation bilaterally, no wheezing, rales, rhonchi or crackles. Normal respiratory effort and patient is not tachypenic. No accessory muscle use. Unlabored breathing  Cardiovascular: RRR, no murmurs / rubs / gallops. S1 and S2 auscultated. No extremity edema. 2+ pedal pulses. No carotid bruits.  Abdomen: Soft, non-tender, Distended 2/2 to body habitus. Bowel sounds positive.  GU: Deferred. Musculoskeletal: No clubbing / cyanosis of digits/nails. No joint deformity upper and lower extremities.  Skin: No rashes, lesions, ulcers on a limited skin evaluation. No induration; Warm and dry.  Neurologic: CN 2-12 grossly intact with no focal deficits. Romberg and cerebellar reflexes not assessed.  Psychiatric: Normal judgment and insight. Alert and oriented x 3. Slightly depressed mood and appropriate affect.   Data Reviewed: I have personally reviewed following labs and imaging studies  CBC: Recent Labs  Lab 10/05/20 1949 10/06/20 0336 10/07/20 0320 10/08/20 0144 10/09/20 0104  WBC 5.5 5.5 5.3 5.4 5.2  NEUTROABS 3.1  --  3.2 3.1 3.0  HGB 12.1* 12.2* 11.1* 10.9* 10.5*  HCT 37.1* 37.7* 34.0* 33.7* 32.3*  MCV 93.9 95.0 94.4 94.1 94.7  PLT 147* 156 155 161 818*   Basic Metabolic Panel: Recent Labs  Lab 10/05/20 1949 10/06/20 0336 10/07/20 0320 10/08/20 0144 10/09/20 0104  NA 140 141 137 139 138  K 4.7 4.2 4.1 4.1 3.9  CL 111 111 107 110 108  CO2 19* 21* 20* 20* 22  GLUCOSE 101* 102* 105* 103* 116*  BUN 48* 45* 48* 53* 62*  CREATININE 5.26* 5.19* 5.52* 6.01* 5.69*  CALCIUM 8.6* 9.3 8.7* 8.7* 8.5*  MG  --   --  2.2 2.2 2.4  PHOS  --   --  4.2 4.4 4.6   GFR: Estimated Creatinine Clearance: 20.8 mL/min (A) (by C-G formula based on SCr of 5.69 mg/dL (H)). Liver Function Tests: Recent Labs  Lab 10/05/20 1949 10/07/20 0320  10/08/20 0144 10/09/20 0104  AST 20 16 15  14*  ALT 17 16 15 15   ALKPHOS 78 70 69 69  BILITOT 0.8 1.2 1.1 0.4  PROT 6.7 6.1* 6.3* 6.0*  ALBUMIN 3.6 2.9* 2.9* 2.8*   No results for input(s): LIPASE, AMYLASE in the last 168 hours. No results for input(s): AMMONIA in the last 168 hours. Coagulation Profile: Recent Labs  Lab 10/06/20 0347  INR 1.1   Cardiac Enzymes: No results for input(s): CKTOTAL, CKMB, CKMBINDEX, TROPONINI in the last 168 hours. BNP (last 3 results) No results for input(s): PROBNP in the last 8760 hours. HbA1C: No results for input(s): HGBA1C in the last 72 hours. CBG: No results for input(s): GLUCAP in the last 168 hours. Lipid Profile: No results for input(s): CHOL, HDL, LDLCALC, TRIG, CHOLHDL, LDLDIRECT in the last 72 hours. Thyroid Function Tests: No results for input(s): TSH, T4TOTAL, FREET4, T3FREE, THYROIDAB in the last 72 hours. Anemia Panel: Recent Labs    10/08/20 0144  VITAMINB12 256  FOLATE 11.0  FERRITIN 112  TIBC 312  IRON 86  RETICCTPCT 1.4   Sepsis Labs: No results for input(s): PROCALCITON, LATICACIDVEN in the last 168 hours.  Recent Results (from the past 240 hour(s))  Resp Panel by RT-PCR (Flu A&B, Covid) Nasopharyngeal Swab     Status: None   Collection Time: 10/05/20  9:24  PM   Specimen: Nasopharyngeal Swab; Nasopharyngeal(NP) swabs in vial transport medium  Result Value Ref Range Status   SARS Coronavirus 2 by RT PCR NEGATIVE NEGATIVE Final    Comment: (NOTE) SARS-CoV-2 target nucleic acids are NOT DETECTED.  The SARS-CoV-2 RNA is generally detectable in upper respiratory specimens during the acute phase of infection. The lowest concentration of SARS-CoV-2 viral copies this assay can detect is 138 copies/mL. A negative result does not preclude SARS-Cov-2 infection and should not be used as the sole basis for treatment or other patient management decisions. A negative result may occur with  improper specimen  collection/handling, submission of specimen other than nasopharyngeal swab, presence of viral mutation(s) within the areas targeted by this assay, and inadequate number of viral copies(<138 copies/mL). A negative result must be combined with clinical observations, patient history, and epidemiological information. The expected result is Negative.  Fact Sheet for Patients:  EntrepreneurPulse.com.au  Fact Sheet for Healthcare Providers:  IncredibleEmployment.be  This test is no t yet approved or cleared by the [place] FDA and  has been authorized for detection and/or diagnosis of SARS-CoV-2 by FDA under an Emergency Use Authorization (EUA). This EUA will remain  in effect (meaning this test can be used) for the duration of the COVID-19 declaration under Section 564(b)(1) of the Act, 21 U.S.C.section 360bbb-3(b)(1), unless the authorization is terminated  or revoked sooner.       Influenza A by PCR NEGATIVE NEGATIVE Final   Influenza B by PCR NEGATIVE NEGATIVE Final    Comment: (NOTE) The Xpert Xpress SARS-CoV-2/FLU/RSV plus assay is intended as an aid in the diagnosis of influenza from Nasopharyngeal swab specimens and should not be used as a sole basis for treatment. Nasal washings and aspirates are unacceptable for Xpert Xpress SARS-CoV-2/FLU/RSV testing.  Fact Sheet for Patients: EntrepreneurPulse.com.au  Fact Sheet for Healthcare Providers: IncredibleEmployment.be  This test is not yet approved or cleared by the [place] FDA and has been authorized for detection and/or diagnosis of SARS-CoV-2 by FDA under an Emergency Use Authorization (EUA). This EUA will remain in effect (meaning this test can be used) for the duration of the COVID-19 declaration under Section 564(b)(1) of the Act, 21 U.S.C. section 360bbb-3(b)(1), unless the authorization is terminated or revoked.  Performed at [facility], [address] [place], [place] [address]   Culture, Urine     Status: Abnormal   Collection Time: 10/07/20 10:20 AM   Specimen: Urine, Clean Catch  Result Value Ref Range Status   Specimen Description URINE, CLEAN CATCH  Final   Special Requests   Final    NONE Performed at [facility], [place] [address] [place], [place] [address]    Culture 40,000 COLONIES/mL STREPTOCOCCUS MITIS/ORALIS (A)  Final   Report Status 10/08/2020 FINAL  Final    RN Pressure Injury Documentation:     Estimated body mass index is 33.31 kg/m as calculated from the following:   Height as of this encounter: 6\' 3"  (1.905 m).   Weight as of this encounter: 120.9 kg.  Malnutrition Type:   Malnutrition Characteristics:   Nutrition Interventions:   Radiology Studies: No results found. Scheduled Meds: . carvedilol  25 mg Oral BID WC  . cloNIDine  0.3 mg Oral Q8H  . furosemide  80 mg Intravenous BID  . heparin  5,000 Units Subcutaneous Q8H  . isosorbide-hydrALAZINE  2 tablet Oral BID   Continuous Infusions:   LOS: 3 days   Kerney Elbe,  DO [facility] PAGER is on AMION  If 7PM-7AM, please contact night-coverage [email or web address]

## 2020-10-09 NOTE — Progress Notes (Signed)
Nephrology Follow-Up Consult note   Assessment/Recommendations: Ian Gregory. is a/an 55 y.o. 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  Historically difficult compliance.  Urine sediment yesterday with pyuria and nondysmorphic hematuria.  Could represent ongoing interstitial nephritis but given his significant CKD no role for immunosuppression at this time.  We discussed his impending need for dialysis.  Does not needed at this time and hopefully can get through hospitalization without starting but likely did not need it soon.  Patient is not able to come to grasp with this realization at this time.  He refuses access referral. -Monitor kidney function daily -Continue discussions regarding dialysis -continue management of BP per primary -continue diuretics  Severe Symptomatic Hypertension  Severe difficult to control hypertension.  Continue increasing Lasix and clonidine today given his persistent hypertension.  Hematuria Nondysmorphic.  Could be related to granulomatous inflammation.  But no likely glomerular bleeding.  Acute on chronic diastolic CHF exacerbation 2/2 severe symptomatic hypertension Echo performed, severe concentric LVH. Suspected to be secondary to persistent hypertension. Continue diuretic therapy as patient still appears hypervolemic on exam and continue to improve BP -continue diuretics and monitor kidney function/electrolytes -once euvolemic, consider stopping diuretics  Secondary hyperparathyroidism:  PTH 148 in 05/2020. Calcium and phosphorus stable.   Anemia of CKD: Hemoglobin stable, continue to monitor  Pulmonary sarcoidosis Follow up with pulmonologist on outpatient basis.  Recommendations conveyed to primary service.    [facility] 10/09/2020 10:00  AM  ___________________________________________________________  CC: AKI on CKD  Interval History/Subjective: Patient states he feels good with no complaints.  We had further discussions regarding dialysis and he states that he is not interested in that.  We did discuss that he is likely to need dialysis within the next few months.  He adamantly refuses access referral or placement at this time. We discussed the risks of not doing this and he understands.   Medications:  Current Facility-Administered Medications  Medication Dose Route Frequency Provider Last Rate Last Admin  . carvedilol (COREG) tablet 25 mg  25 mg Oral BID WC Cox, Amy N, DO   25 mg at 10/09/20 0843  . cloNIDine (CATAPRES) tablet 0.3 mg  0.3 mg Oral Q8H Reesa Chew, MD      . furosemide (LASIX) injection 80 mg  80 mg Intravenous BID Reesa Chew, MD   80 mg at 10/09/20 0843  . heparin injection 5,000 Units  5,000 Units Subcutaneous Q8H Cox, Amy N, DO   5,000 Units at 10/09/20 0507  . hydrALAZINE (APRESOLINE) injection 10 mg  10 mg Intravenous Q4H PRN Raiford Noble [place], DO   10 mg at 10/08/20 5361  . isosorbide-hydrALAZINE (BIDIL) 20-37.5 MG per tablet 2 tablet  2 tablet Oral BID Reesa Chew, MD   2 tablet at 10/09/20 [phone]  . labetalol (NORMODYNE) injection 10 mg  10 mg Intravenous Q2H PRN Raiford Noble [place], DO   10 mg at 10/08/20 5400  . ondansetron (ZOFRAN) tablet 4 mg  4 mg Oral Q6H PRN Cox, Amy N, DO       Or  . ondansetron (ZOFRAN) injection 4 mg  4 mg Intravenous Q6H PRN Cox, Amy N, DO          Review of Systems: 10 systems reviewed and negative except per interval history/subjective  Physical Exam: Vitals:   10/09/20 0424 10/09/20 8676  BP: (!) 191/137 (!) 182/133  Pulse: 75 76  Resp: 18 16  Temp: 98 F (36.7 C) 97.8 F (36.6 C)  SpO2: 97% 97%   Total I/O In: -  Out: 425 [Urine:425]  Intake/Output Summary (Last 24 hours) at 10/09/2020 1000 Last data filed at 10/09/2020 6286 Gross  per 24 hour  Intake 480 ml  Output 1900 ml  Net -1420 ml   Constitutional: well-appearing, no acute distress ENMT: ears and nose without scars or lesions, MMM CV: normal rate, trace edema in the bilateral lower extremities Respiratory: Bilateral chest rise, normal work of breathing Gastrointestinal: soft, non-tender, no palpable masses or hernias Skin: no visible lesions or rashes Psych: alert, judgement/insight appropriate, appropriate mood and affect   Test Results I personally reviewed new and old clinical labs and radiology tests Lab Results  Component Value Date   NA 138 10/09/2020   K 3.9 10/09/2020   CL 108 10/09/2020   CO2 22 10/09/2020   BUN 62 (H) 10/09/2020   CREATININE 5.69 (H) 10/09/2020   GFR 71.39 08/20/2013   CALCIUM 8.5 (L) 10/09/2020   ALBUMIN 2.8 (L) 10/09/2020   PHOS 4.6 10/09/2020

## 2020-10-09 NOTE — Plan of Care (Signed)

## 2020-10-10 LAB — COMPREHENSIVE METABOLIC PANEL
ALT: 16 U/L (ref 0–44)
AST: 14 U/L — ABNORMAL LOW (ref 15–41)
Albumin: 2.8 g/dL — ABNORMAL LOW (ref 3.5–5.0)
Alkaline Phosphatase: 65 U/L (ref 38–126)
Anion gap: 10 (ref 5–15)
BUN: 61 mg/dL — ABNORMAL HIGH (ref 6–20)
CO2: 19 mmol/L — ABNORMAL LOW (ref 22–32)
Calcium: 8.8 mg/dL — ABNORMAL LOW (ref 8.9–10.3)
Chloride: 109 mmol/L (ref 98–111)
Creatinine, Ser: 5.66 mg/dL — ABNORMAL HIGH (ref 0.61–1.24)
GFR, Estimated: 11 mL/min — ABNORMAL LOW (ref >60)
Glucose, Bld: 110 mg/dL — ABNORMAL HIGH (ref 70–99)
Potassium: 4.1 mmol/L (ref 3.5–5.1)
Sodium: 138 mmol/L (ref 135–145)
Total Bilirubin: 0.7 mg/dL (ref 0.3–1.2)
Total Protein: 6.1 g/dL — ABNORMAL LOW (ref 6.5–8.1)

## 2020-10-10 LAB — CBC WITH DIFFERENTIAL/PLATELET
Abs Immature Granulocytes: 0.02 10*3/uL (ref 0.00–0.07)
Basophils Absolute: 0 10*3/uL (ref 0.0–0.1)
Basophils Relative: 0 %
Eosinophils Absolute: 0.1 10*3/uL (ref 0.0–0.5)
Eosinophils Relative: 2 %
HCT: 32.6 % — ABNORMAL LOW (ref 39.0–52.0)
Hemoglobin: 10.5 g/dL — ABNORMAL LOW (ref 13.0–17.0)
Immature Granulocytes: 0 %
Lymphocytes Relative: 25 %
Lymphs Abs: 1.1 10*3/uL (ref 0.7–4.0)
MCH: 30.6 pg (ref 26.0–34.0)
MCHC: 32.2 g/dL (ref 30.0–36.0)
MCV: 95 fL (ref 80.0–100.0)
Monocytes Absolute: 0.6 10*3/uL (ref 0.1–1.0)
Monocytes Relative: 14 %
Neutro Abs: 2.7 10*3/uL (ref 1.7–7.7)
Neutrophils Relative %: 59 %
Platelets: 161 10*3/uL (ref 150–400)
RBC: 3.43 MIL/uL — ABNORMAL LOW (ref 4.22–5.81)
RDW: 13.7 % (ref 11.5–15.5)
WBC: 4.6 10*3/uL (ref 4.0–10.5)
nRBC: 0 % (ref 0.0–0.2)

## 2020-10-10 LAB — PHOSPHORUS: Phosphorus: 5 mg/dL — ABNORMAL HIGH (ref 2.5–4.6)

## 2020-10-10 LAB — MAGNESIUM: Magnesium: 2.2 mg/dL (ref 1.7–2.4)

## 2020-10-10 NOTE — Progress Notes (Signed)
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  Historically difficult compliance.  Urine sediment with pyuria and nondysmorphic hematuria.  Could represent ongoing interstitial nephritis but given his significant CKD no role for immunosuppression at this time.  Per Dr. Carolin Sicks his Cr has been 2.9 - 3.35 - Continue supportive measures - Continue discussions regarding dialysis - he has refused access placement thus far  -continue diuretics   Severe Symptomatic Hypertension Severe difficult to control hypertension.  -  Improved control on current regimen   Hematuria Nondysmorphic.  Could be related to granulomatous inflammation.  But no likely glomerular bleeding.  Acute on chronic diastolic CHF exacerbation 2/2 severe symptomatic hypertension Echo performed, severe concentric LVH. Suspected to be secondary to persistent hypertension.  -continue diuretics  Secondary hyperparathyroidism:  PTH 148 in 05/2020. Calcium and phosphorus acceptable    Anemia of CKD: Hemoglobin stable, continue to monitor; no indication for ESA  Pulmonary sarcoidosis Follow up with pulmonologist on outpatient basis.  Disposition per primary team.  He has thus far deferred AVF.  Will set up outpatient follow-up with Dr. Carolin Sicks or extender in 1-2 weeks.  _______________________________________________________    Interval History/Subjective:  He has not changed his mind about dialysis access. Feels better than he did on admit  Review of systems: Denies shortness of breath or chest pain  Denies n/v   Medications:  Current Facility-Administered Medications  Medication Dose Route Frequency Provider Last Rate Last Admin  . acetaminophen (TYLENOL) tablet 650 mg  650 mg Oral Q6H PRN Raiford Noble [place], DO   650 mg at 10/09/20 1235  . carvedilol  (COREG) tablet 25 mg  25 mg Oral BID WC Cox, Amy N, DO   25 mg at 10/10/20 1000  . cloNIDine (CATAPRES) tablet 0.3 mg  0.3 mg Oral Q8H Reesa Chew, MD   0.3 mg at 10/10/20 3716  . furosemide (LASIX) injection 80 mg  80 mg Intravenous BID Reesa Chew, MD   80 mg at 10/10/20 1001  . heparin injection 5,000 Units  5,000 Units Subcutaneous Q8H Cox, Amy N, DO   5,000 Units at 10/10/20 [phone]  . hydrALAZINE (APRESOLINE) injection 10 mg  10 mg Intravenous Q4H PRN Raiford Noble [place], DO   10 mg at 10/10/20 0321  . isosorbide-hydrALAZINE (BIDIL) 20-37.5 MG per tablet 2 tablet  2 tablet Oral BID Reesa Chew, MD   2 tablet at 10/10/20 1000  . labetalol (NORMODYNE) injection 10 mg  10 mg Intravenous Q2H PRN Raiford Noble [place], DO   10 mg at 10/08/20 9381  . ondansetron (ZOFRAN) tablet 4 mg  4 mg Oral Q6H PRN Cox, Amy N, DO       Or  . ondansetron (ZOFRAN) injection 4 mg  4 mg Intravenous Q6H PRN Cox, Amy N, DO          Physical Exam: Vitals:   10/10/20 0753 10/10/20 1134  BP: (!) 174/115 (!) 127/92  Pulse: 72 67  Resp: 18 17  Temp: (!) 97.5 F (36.4 C) 97.6 F (36.4 C)  SpO2:     No intake/output data recorded.  Intake/Output Summary (Last 24 hours) at 10/10/2020 1240 Last data filed at 10/10/2020 0700 Gross per 24 hour  Intake 240 ml  Output 2450 ml  Net -2210 ml    General adult male in bed in no acute distress HEENT normocephalic atraumatic extraocular movements intact  sclera anicteric Neck supple trachea midline Lungs clear to auscultation bilaterally normal work of breathing at rest  Heart S1S2 no rub Abdomen soft nontender nondistended Extremities trace edema lower extremities Psych normal mood and affect Neuro alert and oriented x3 provides hx and follows commands   Test Results I personally reviewed new and old clinical labs and radiology tests Lab Results  Component Value Date   NA 138 10/10/2020   K 4.1 10/10/2020   CL 109 10/10/2020   CO2 19 (L)  10/10/2020   BUN 61 (H) 10/10/2020   CREATININE 5.66 (H) 10/10/2020   GFR 71.39 08/20/2013   CALCIUM 8.8 (L) 10/10/2020   ALBUMIN 2.8 (L) 10/10/2020   PHOS 5.0 (H) 10/10/2020     Claudia Desanctis, MD 10/10/2020 1:03 PM

## 2020-10-10 NOTE — Progress Notes (Signed)
PROGRESS NOTE    Ian Gregory.  SWF:[id number] DOB: 05/21/1966 DOA: 10/05/2020 PCP: [facility]   Brief Narrative:  The patient is a 55 year old obese African-American male with a past medical history significant for but not limited to hypertension, CKD stage IV, history of diastolic CHF, sarcoidosis, as well as other comorbidities who presented to the emergency room with a chief concern of shortness of breath.  He reports that the shortness of breath and going on for the last month or so is worse with exertion and states that he is not able to lay flat and has to have 3 pillows being propped up to sleep.  He presented to his PCP on 10/05/2020 and was told to go to the ED given his elevated blood pressure.  He reported shortness of breath that has been going on for about a month and states has been difficult for him to obtain an appointment to see his PCP.  He also endorses bilateral lower extremity edema that has been going on for years but acutely worsened.  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    **Interim History  Blood pressure still remains elevated and he still appears a little hypervolemic so we will continue IV diuretics and has been increased to IV 60 mg BID by nephrology yesterday and to IV 80 mg BID today.  Nephrology has made some adjustments in his blood pressure medications as well again.  Nephrology recommended continue IV diuresis today and continue monitor renal function carefully as his renal function as it is going up and they feel that the patient may need dialysis this hospitalization.  They are working up his hematuria and sending a urine culture and analyze any microscopy and we may need to discuss with urology. Urine Cx showing 40,000 CFU of Streptococus Mitis/Oralis. Patient Is off the diltiazem drip now  Assessment & Plan:   Principal Problem:   Hypertensive  emergency Active Problems:   Accelerated hypertension   Acute renal failure superimposed on stage 3 chronic kidney disease (HCC)   Anemia of chronic renal failure, stage 3 (moderate) (HCC)   ARF (acute renal failure) (HCC)  Hypertensive emergency/urgency, improving  Accelerated hypertension -Initiated onDiltiazem gtt but will attempt to wean and is now off of the diltiazem drip -Given a Dose of IV Lasix 80 mg x1 and now has been changed to IV Lasix 40 mg every 12 but now increased to IV 60 mg BID yesterday and to IV 80 mg BID today  -Resumed home antihypertensives: Isosorbide hydralazine 20-30 7.5 twice daily, Coreg 25 mg twice daily; nephrology is added clonidine 0.1 mg p.o. 3 times daily and this has been 0.2 mg p.o. 3 times daily yesterday and to 0.3 mg po TID yesterday  -With as needed antihypertensives with labetalol 10 mg IV every 2 as needed for systolic blood pressure in 220 or diastolic blood pressure greater 100 in addition to IV hydralazine 10 mg every 4 as needed's for SBP greater than 180 or DBP within 100 -Complete echo ordered and is as below -Continue to Monitor BP per Protocol  -Last blood pressure elevated and was 174/115 this AM and improved to 127/92 and may need further medication adjustments  Acute on Chronic Diastolic CHF -last echocardiogram was done in 2018 and showed a normal EF of 55 to 60% with grade 1 diastolic dysfunction -Repeat echocardiogram this time and it showed severe concentric LVH  as well as a left ventricular EF of 50 to 55% and grade 2 diastolic dysfunction -BNP was 441.9 and patient is unable to lie flat and has dyspnea on exertion along with pitting edema -He was started on diuresis with IV Lasix and nephrology is increased this to IV 60 mg every 12 yesterday and to IV 80 mg BID today  -Strict I's and O's and daily weights; the patient is -6.867 L since admission and weight is down 11 lbs  -Nephrology consulted for further assistance and evaluation  given his renal dysfunction -Patient was weaned off of diltiazem drip and will need to continue monitor his renal function carefully as he is being diuresed  Acute Kidney Injury on CKD stage IV-suspect secondary to hypertensive emergency, worsening  Metabolic Acidosis 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 -BUN/creatinine went from 48/5.26 -> 45/5.19 -> 48/5.52 -> 53/6.01 -> 62/5.69 -> 61/5.66 -Patient has some mild metabolic acidosis with a CO2 of 19, anion gap of 10, chloride level of 110 -Nephrology feels the patient is still hypervolemic on examination and recommending continue IV diuretics today and he is currently on IV 80 mg q12h -Nephrology following and recommending to continue to monitor fluid status carefully and hold Lasix once he is euvolemic but they feel that he has had historically difficult compliance and because the patient urine sediment yesterday with prior nondysmorphic hematuria they could represent ongoing interstitial nephritis but given his significant CKD they do not feel that immunosuppression at this time is indicated. -Already has discussed that he may require dialysis during his hospitalization and that if his creatinine is not improving on Monday and the plan is likely for access placement and vascular consultation -PTH was 148 back in January of this year; Ca2+ was 8.7 and Phos was 4.2  -Renal ultrasound done and showed no evidence of medical renal disease and no obstruction -Continue to monitor blood pressure carefully -Avoid nephrotoxic medications, contrast dyes, hypotension and renally dose medications -Repeat CMP in a.m. -Neurology continues to have dialysis discussions with the patient and patient continues to refuse access placement thus far and continues to defer AVF -Nephrology recommends continuing supportive measures and will continue diuresis as above and transition to p.o. per nephrology  recommendations  Normocytic Anemia/Anemia of chronic kidney disease -Patient's Hgb/Hct went from 12.1/37.1 -> 12.2/37.7 -> 11.1/34.0 -> 10.9/33.7 -> 10.5/32.3 -> 10.5/32.6 today  -Checked Anemia Panel and showed iron level of 86, U IBC 226, TIBC 312, saturation ratios of 28%, ferritin of 112, folate level of 11.0, vitamin B12 256 -Continue monitor for signs and symptoms bleeding; Currently no overt bleeding noted  -Repeat CBC in a.m.  History of hematuria and suprapubic abdominal pain-patient urine was negative for hematuria during this hospitalization -If persists with recommend a.m. team to order a CT stone chaser to assess for kidney stones -U/A done and showed moderate hemoglobin on urine dipstick, small leukocytes, negative nitrites, greater than 300 protein, no bacteria seen, 21-50 RBCs per high-powered field, 0-5 squamous epithelial cells, greater than 50 WBCs -Urine culture is being checked and there is minimum and analysis microscopy -May need to speak with urology but nephrology believes this is nondysmorphic and could be related to granulomatous inflammation but no glomerular bleeding -Urine culture revealed 40,000 colony-forming units of Streptococcus Mitis/Oralis  Pulmonary Sarcoidosis -CXR this AM showed "There is atelectatic change in the left lower lung region. There is no edema or airspace opacity. There is cardiomegaly with pulmonary vascularity normal. No adenopathy. No  bone lesions." -He will need outpatient follow-up with his Pulmonologist  Obesity -Complicates overall prognosis and care -Estimated body mass index is 33.07 kg/m as calculated from the following:   Height as of this encounter: 6\' 3"  (1.905 m).   Weight as of this encounter: 120 kg. -Weight Loss and Dietary Counseling given   DVT prophylaxis: Heparin 5,000 units sq q8h  Code Status: FULL CODE  Family Communication: No family present at bedside  Disposition Plan: Pending further clinical improvement  and clearance by Nephrology; Remains on IV Diuretics today   Status is: Inpatient  Remains inpatient appropriate because:Unsafe d/c plan, IV treatments appropriate due to intensity of illness or inability to take PO and Inpatient level of care appropriate due to severity of illness   Dispo: The patient is from: Home              Anticipated d/c is to: Home              Patient currently is not medically stable to d/c.   Difficult to place patient No  Consultants:   Nephrology   Procedures:  ECHOCARDIOGRAM IMPRESSIONS    1. Severe concentric LVH is present. Given the patient's history of  poorly controlled hypertension, favors a diagnosis of hypertensive heart  disease.  2. Left ventricular ejection fraction, by estimation, is 55 to 60%. The  left ventricle has normal function. The left ventricle has no regional  wall motion abnormalities. There is severe concentric left ventricular  hypertrophy. Left ventricular diastolic  parameters are consistent with Grade II diastolic dysfunction  (pseudonormalization). Elevated left atrial pressure.  3. Right ventricular systolic function is normal. The right ventricular  size is normal. Tricuspid regurgitation signal is inadequate for assessing  PA pressure.  4. The mitral valve is grossly normal. Mild mitral valve regurgitation.  No evidence of mitral stenosis.  5. The aortic valve is tricuspid. Aortic valve regurgitation is trivial.  Mild aortic valve sclerosis is present, with no evidence of aortic valve  stenosis.  6. Aortic dilatation noted. There is mild dilatation of the aortic root,  measuring 40 mm.  7. The inferior vena cava is normal in size with greater than 50%  respiratory variability, suggesting right atrial pressure of 3 mmHg.   FINDINGS  Left Ventricle: Left ventricular ejection fraction, by estimation, is 55  to 60%. The left ventricle has normal function. The left ventricle has no  regional wall motion  abnormalities. The left ventricular internal cavity  size was normal in size. There is  severe concentric left ventricular hypertrophy. Left ventricular  diastolic parameters are consistent with Grade II diastolic dysfunction  (pseudonormalization). Elevated left atrial pressure.   Right Ventricle: The right ventricular size is normal. No increase in  right ventricular wall thickness. Right ventricular systolic function is  normal. Tricuspid regurgitation signal is inadequate for assessing PA  pressure.   Left Atrium: Left atrial size was normal in size.   Right Atrium: Right atrial size was normal in size.   Pericardium: Trivial pericardial effusion is present.   Mitral Valve: The mitral valve is grossly normal. Mild mitral valve  regurgitation. No evidence of mitral valve stenosis.   Tricuspid Valve: The tricuspid valve is grossly normal. Tricuspid valve  regurgitation is trivial. No evidence of tricuspid stenosis.   Aortic Valve: The aortic valve is tricuspid. Aortic valve regurgitation is  trivial. Mild aortic valve sclerosis is present, with no evidence of  aortic valve stenosis. Aortic valve mean gradient measures 5.0  mmHg.  Aortic valve peak gradient measures 9.5  mmHg. Aortic valve area, by VTI measures 2.63 cm.   Pulmonic Valve: The pulmonic valve was grossly normal. Pulmonic valve  regurgitation is trivial. No evidence of pulmonic stenosis.   Aorta: Aortic dilatation noted. There is mild dilatation of the aortic  root, measuring 40 mm.   Venous: The inferior vena cava is normal in size with greater than 50%  respiratory variability, suggesting right atrial pressure of 3 mmHg.   IAS/Shunts: The atrial septum is grossly normal.     LEFT VENTRICLE  PLAX 2D  LVIDd:     4.30 cm Diastology  LVIDs:     2.90 cm LV e' medial:  4.79 cm/s  LV PW:     2.40 cm LV E/e' medial: 23.4  LV IVS:    2.30 cm LV e' lateral:  5.98 cm/s  LVOT diam:   2.30  cm LV E/e' lateral: 18.7  LV SV:     72  LV SV Index:  29  LVOT Area:   4.15 cm     RIGHT VENTRICLE       IVC  RV Basal diam: 4.30 cm   IVC diam: 1.40 cm  RV S prime:   14.60 cm/s   LEFT ATRIUM       Index    RIGHT ATRIUM      Index  LA diam:    4.10 cm 1.66 cm/m RA Area:   23.60 cm  LA Vol (A2C):  83.2 ml 33.70 ml/m RA Volume:  72.70 ml 29.44 ml/m  LA Vol (A4C):  78.9 ml 31.95 ml/m  LA Biplane Vol: 81.9 ml 33.17 ml/m  AORTIC VALVE  AV Area (Vmax):  2.75 cm  AV Area (Vmean):  2.56 cm  AV Area (VTI):   2.63 cm  AV Vmax:      154.00 cm/s  AV Vmean:     105.500 cm/s  AV VTI:      0.274 m  AV Peak Grad:   9.5 mmHg  AV Mean Grad:   5.0 mmHg  LVOT Vmax:     102.00 cm/s  LVOT Vmean:    65.100 cm/s  LVOT VTI:     0.174 m  LVOT/AV VTI ratio: 0.63    AORTA  Ao Root diam: 4.00 cm  Ao Asc diam: 3.90 cm   MITRAL VALVE  MV Area (PHT): 3.17 cm   SHUNTS  MV Decel Time: 239 msec   Systemic VTI: 0.17 m  MV E velocity: 112.00 cm/s Systemic Diam: 2.30 cm  MV A velocity: 75.30 cm/s  MV E/A ratio: 1.49   Antimicrobials:  Anti-infectives (From admission, onward)   None        Subjective: Seen and examined at bedside and was doing a little bit better.  Denies any chest pain, shortness of breath, lightheadedness or dizziness.  No nausea or vomiting.  Renal function is minimally improved.  No other concerns or complaints at this time.  Objective: Vitals:   10/09/20 2034 10/10/20 0002 10/10/20 0428 10/10/20 0753  BP: (!) 161/118 (!) 137/103 (!) 126/91 (!) 174/115  Pulse: 64 65 68 72  Resp: 18 18 17 18   Temp: 98.4 F (36.9 C) 98.2 F (36.8 C) 97.7 F (36.5 C) (!) 97.5 F (36.4 C)  TempSrc: Oral Oral Oral Oral  SpO2: 98% 96%    Weight:   120 kg   Height:        Intake/Output Summary (Last 24 hours) at  10/10/2020 0859 Last data filed at 10/10/2020 0700 Gross per 24 hour   Intake 240 ml  Output 2850 ml  Net -2610 ml   Filed Weights   10/08/20 0300 10/09/20 0424 10/10/20 0428  Weight: 118.9 kg 120.9 kg 120 kg   Examination: Physical Exam:  Constitutional: WN/WD obese African-American male currently in no acute distress appears calm Eyes: Lids and conjunctivae normal, sclerae anicteric  ENMT: External Ears, Nose appear normal. Grossly normal hearing.  Neck: Appears normal, supple, no cervical masses, normal ROM, no appreciable thyromegaly: No JVD Respiratory: Diminished to auscultation bilaterally with coarse breath sounds, no wheezing, rales, rhonchi or crackles. Normal respiratory effort and patient is not tachypenic. No accessory muscle use.  Unlabored breathing and not wearing supplemental oxygen via nasal cannula Cardiovascular: RRR, no murmurs / rubs / gallops. S1 and S2 auscultated.  Is 1+ lower extremity edema Abdomen: Soft, non-tender, distended secondary by habitus. Bowel sounds positive.  GU: Deferred. Musculoskeletal: No clubbing / cyanosis of digits/nails. No joint deformity upper and lower extremities.  Skin: No rashes, lesions, ulcers on limited skin evaluation. No induration; Warm and dry.  Neurologic: CN 2-12 grossly intact with no focal deficits. Romberg sign and cerebellar reflexes not assessed.  Psychiatric: Normal judgment and insight. Alert and oriented x 3. Normal mood and appropriate affect.   Data Reviewed: I have personally reviewed following labs and imaging studies  CBC: Recent Labs  Lab 10/05/20 1949 10/06/20 0336 10/07/20 0320 10/08/20 0144 10/09/20 0104 10/10/20 0239  WBC 5.5 5.5 5.3 5.4 5.2 4.6  NEUTROABS 3.1  --  3.2 3.1 3.0 2.7  HGB 12.1* 12.2* 11.1* 10.9* 10.5* 10.5*  HCT 37.1* 37.7* 34.0* 33.7* 32.3* 32.6*  MCV 93.9 95.0 94.4 94.1 94.7 95.0  PLT 147* 156 155 161 141* 284   Basic Metabolic Panel: Recent Labs  Lab 10/06/20 0336 10/07/20 0320 10/08/20 0144 10/09/20 0104 10/10/20 0239  NA 141 137 139 138  138  K 4.2 4.1 4.1 3.9 4.1  CL 111 107 110 108 109  CO2 21* 20* 20* 22 19*  GLUCOSE 102* 105* 103* 116* 110*  BUN 45* 48* 53* 62* 61*  CREATININE 5.19* 5.52* 6.01* 5.69* 5.66*  CALCIUM 9.3 8.7* 8.7* 8.5* 8.8*  MG  --  2.2 2.2 2.4 2.2  PHOS  --  4.2 4.4 4.6 5.0*   GFR: Estimated Creatinine Clearance: 20.8 mL/min (A) (by C-G formula based on SCr of 5.66 mg/dL (H)). Liver Function Tests: Recent Labs  Lab 10/05/20 1949 10/07/20 0320 10/08/20 0144 10/09/20 0104 10/10/20 0239  AST 20 16 15  14* 14*  ALT 17 16 15 15 16   ALKPHOS 78 70 69 69 65  BILITOT 0.8 1.2 1.1 0.4 0.7  PROT 6.7 6.1* 6.3* 6.0* 6.1*  ALBUMIN 3.6 2.9* 2.9* 2.8* 2.8*   No results for input(s): LIPASE, AMYLASE in the last 168 hours. No results for input(s): AMMONIA in the last 168 hours. Coagulation Profile: Recent Labs  Lab 10/06/20 0347  INR 1.1   Cardiac Enzymes: No results for input(s): CKTOTAL, CKMB, CKMBINDEX, TROPONINI in the last 168 hours. BNP (last 3 results) No results for input(s): PROBNP in the last 8760 hours. HbA1C: No results for input(s): HGBA1C in the last 72 hours. CBG: No results for input(s): GLUCAP in the last 168 hours. Lipid Profile: No results for input(s): CHOL, HDL, LDLCALC, TRIG, CHOLHDL, LDLDIRECT in the last 72 hours. Thyroid Function Tests: No results for input(s): TSH, T4TOTAL, FREET4, T3FREE, THYROIDAB in the last 72 hours.  Anemia Panel: Recent Labs    10/08/20 0144  VITAMINB12 256  FOLATE 11.0  FERRITIN 112  TIBC 312  IRON 86  RETICCTPCT 1.4   Sepsis Labs: No results for input(s): PROCALCITON, LATICACIDVEN in the last 168 hours.  Recent Results (from the past 240 hour(s))  Resp Panel by RT-PCR (Flu A&B, Covid) Nasopharyngeal Swab     Status: None   Collection Time: 10/05/20  9:24 PM   Specimen: Nasopharyngeal Swab; Nasopharyngeal(NP) swabs in vial transport medium  Result Value Ref Range Status   SARS Coronavirus 2 by RT PCR NEGATIVE NEGATIVE Final     Comment: (NOTE) SARS-CoV-2 target nucleic acids are NOT DETECTED.  The SARS-CoV-2 RNA is generally detectable in upper respiratory specimens during the acute phase of infection. The lowest concentration of SARS-CoV-2 viral copies this assay can detect is 138 copies/mL. A negative result does not preclude SARS-Cov-2 infection and should not be used as the sole basis for treatment or other patient management decisions. A negative result may occur with  improper specimen collection/handling, submission of specimen other than nasopharyngeal swab, presence of viral mutation(s) within the areas targeted by this assay, and inadequate number of viral copies(<138 copies/mL). A negative result must be combined with clinical observations, patient history, and epidemiological information. The expected result is Negative.  Fact Sheet for Patients:  EntrepreneurPulse.com.au  Fact Sheet for Healthcare Providers:  IncredibleEmployment.be  This test is no t yet approved or cleared by the [place] FDA and  has been authorized for detection and/or diagnosis of SARS-CoV-2 by FDA under an Emergency Use Authorization (EUA). This EUA will remain  in effect (meaning this test can be used) for the duration of the COVID-19 declaration under Section 564(b)(1) of the Act, 21 U.S.C.section 360bbb-3(b)(1), unless the authorization is terminated  or revoked sooner.       Influenza A by PCR NEGATIVE NEGATIVE Final   Influenza B by PCR NEGATIVE NEGATIVE Final    Comment: (NOTE) The Xpert Xpress SARS-CoV-2/FLU/RSV plus assay is intended as an aid in the diagnosis of influenza from Nasopharyngeal swab specimens and should not be used as a sole basis for treatment. Nasal washings and aspirates are unacceptable for Xpert Xpress SARS-CoV-2/FLU/RSV testing.  Fact Sheet for Patients: EntrepreneurPulse.com.au  Fact Sheet for Healthcare  Providers: IncredibleEmployment.be  This test is not yet approved or cleared by the [place] FDA and has been authorized for detection and/or diagnosis of SARS-CoV-2 by FDA under an Emergency Use Authorization (EUA). This EUA will remain in effect (meaning this test can be used) for the duration of the COVID-19 declaration under Section 564(b)(1) of the Act, 21 U.S.C. section 360bbb-3(b)(1), unless the authorization is terminated or revoked.  Performed at [facility], [address] [place], [place] [address]   Culture, Urine     Status: Abnormal   Collection Time: 10/07/20 10:20 AM   Specimen: Urine, Clean Catch  Result Value Ref Range Status   Specimen Description URINE, CLEAN CATCH  Final   Special Requests   Final    NONE Performed at [facility], [place] [address] [place], [place] [address]    Culture 40,000 COLONIES/mL STREPTOCOCCUS MITIS/ORALIS (A)  Final   Report Status 10/08/2020 FINAL  Final    RN Pressure Injury Documentation:     Estimated body mass index is 33.07 kg/m as calculated from the following:   Height as of this encounter: 6\' 3"  (1.905 m).   Weight as of this encounter: 120 kg.  Malnutrition Type:   Malnutrition Characteristics:   Nutrition Interventions:   Radiology Studies: No results found. Scheduled Meds: . carvedilol  25 mg Oral BID WC  . cloNIDine  0.3 mg Oral Q8H  . furosemide  80 mg Intravenous BID  . heparin  5,000 Units Subcutaneous Q8H  . isosorbide-hydrALAZINE  2 tablet Oral BID   Continuous Infusions:   LOS: 4 days   Kerney Elbe, DO [facility] PAGER is on AMION  If 7PM-7AM, please contact night-coverage [email or web address]

## 2020-10-11 LAB — CBC WITH DIFFERENTIAL/PLATELET
Abs Immature Granulocytes: 0.01 10*3/uL (ref 0.00–0.07)
Basophils Absolute: 0 10*3/uL (ref 0.0–0.1)
Basophils Relative: 0 %
Eosinophils Absolute: 0.1 10*3/uL (ref 0.0–0.5)
Eosinophils Relative: 2 %
HCT: 33.6 % — ABNORMAL LOW (ref 39.0–52.0)
Hemoglobin: 10.8 g/dL — ABNORMAL LOW (ref 13.0–17.0)
Immature Granulocytes: 0 %
Lymphocytes Relative: 25 %
Lymphs Abs: 1.1 10*3/uL (ref 0.7–4.0)
MCH: 30.5 pg (ref 26.0–34.0)
MCHC: 32.1 g/dL (ref 30.0–36.0)
MCV: 94.9 fL (ref 80.0–100.0)
Monocytes Absolute: 0.7 10*3/uL (ref 0.1–1.0)
Monocytes Relative: 16 %
Neutro Abs: 2.6 10*3/uL (ref 1.7–7.7)
Neutrophils Relative %: 57 %
Platelets: 162 10*3/uL (ref 150–400)
RBC: 3.54 MIL/uL — ABNORMAL LOW (ref 4.22–5.81)
RDW: 13.4 % (ref 11.5–15.5)
WBC: 4.5 10*3/uL (ref 4.0–10.5)
nRBC: 0 % (ref 0.0–0.2)

## 2020-10-11 LAB — COMPREHENSIVE METABOLIC PANEL
ALT: 18 U/L (ref 0–44)
AST: 17 U/L (ref 15–41)
Albumin: 3 g/dL — ABNORMAL LOW (ref 3.5–5.0)
Alkaline Phosphatase: 68 U/L (ref 38–126)
Anion gap: 12 (ref 5–15)
BUN: 68 mg/dL — ABNORMAL HIGH (ref 6–20)
CO2: 20 mmol/L — ABNORMAL LOW (ref 22–32)
Calcium: 8.7 mg/dL — ABNORMAL LOW (ref 8.9–10.3)
Chloride: 105 mmol/L (ref 98–111)
Creatinine, Ser: 5.53 mg/dL — ABNORMAL HIGH (ref 0.61–1.24)
GFR, Estimated: 11 mL/min — ABNORMAL LOW (ref >60)
Glucose, Bld: 106 mg/dL — ABNORMAL HIGH (ref 70–99)
Potassium: 3.8 mmol/L (ref 3.5–5.1)
Sodium: 137 mmol/L (ref 135–145)
Total Bilirubin: 1 mg/dL (ref 0.3–1.2)
Total Protein: 6.5 g/dL (ref 6.5–8.1)

## 2020-10-11 LAB — PHOSPHORUS: Phosphorus: 4.9 mg/dL — ABNORMAL HIGH (ref 2.5–4.6)

## 2020-10-11 LAB — MAGNESIUM: Magnesium: 2.4 mg/dL (ref 1.7–2.4)

## 2020-10-11 MED ORDER — FUROSEMIDE 40 MG PO TABS: 60.0000 mg | ORAL_TABLET | Freq: Two times a day (BID) | ORAL | Status: DC

## 2020-10-11 MED ORDER — ACETAMINOPHEN 325 MG PO TABS: 650.0000 mg | ORAL_TABLET | Freq: Four times a day (QID) | ORAL | 0 refills | Status: DC | PRN

## 2020-10-11 MED ORDER — ONDANSETRON HCL 4 MG PO TABS: 4.0000 mg | ORAL_TABLET | Freq: Four times a day (QID) | ORAL | 0 refills | Status: DC | PRN

## 2020-10-11 MED ORDER — FUROSEMIDE 20 MG PO TABS: 60.0000 mg | ORAL_TABLET | Freq: Two times a day (BID) | ORAL | 0 refills | Status: DC

## 2020-10-11 MED ORDER — CLONIDINE HCL 0.3 MG PO TABS: 0.3000 mg | ORAL_TABLET | Freq: Three times a day (TID) | ORAL | 11 refills | Status: AC

## 2020-10-11 MED ORDER — ISOSORB DINITRATE-HYDRALAZINE 20-37.5 MG PO TABS: 2.0000 | ORAL_TABLET | Freq: Two times a day (BID) | ORAL | 0 refills | Status: DC

## 2020-10-11 NOTE — Progress Notes (Signed)
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  Historically difficult compliance.  Urine sediment with pyuria and nondysmorphic hematuria.  Could represent ongoing interstitial nephritis but given his significant CKD no role for immunosuppression at this time.  Per Dr. Carolin Sicks his Cr has been 2.9 - 3.35 - Continue supportive measures - Continue discussions regarding dialysis - he has refused access placement thus far  - stop IV lasix.  Home lasix is reportedly 40 mg PO BID.  Increase to lasix 60 mg PO BID  Severe Symptomatic Hypertension Severe difficult to control hypertension.  -  Improved control on current regimen.  Note clonidine was increased to 0.3 mg TID.  Transition back to PO lasix as below  - Defer RAAS blockade in the setting of advancing CKD  Hematuria Nondysmorphic.  Could be related to granulomatous inflammation.  But no likely glomerular bleeding.  Acute on chronic diastolic CHF exacerbation 2/2 severe symptomatic hypertension Echo performed, severe concentric LVH. Suspected to be secondary to persistent hypertension.  - transition to PO lasix as above   Secondary hyperparathyroidism:  PTH 148 in 05/2020. Calcium and phosphorus acceptable    Anemia of CKD:   Hemoglobin stable, continue to monitor; no indication for ESA  Pulmonary sarcoidosis Follow up with pulmonologist on outpatient basis.  Nephrology will sign off.  Disposition per primary team.  He has thus far deferred AVF.   He is set up for follow-up appt with Dr. Carolin Sicks at [facility] on 10/18/20 at 1:00 pm.  I emphasized the importance of coming to this appt - he was aware of date and told me staff had called him.  _______________________________________________________    Interval History/Subjective:  Got lasix IV this am.  He had lasix 2.3  liters UOP over 5/23.  He states he feels ok.  Eager to get home.  He has multiple family members on dialysis and with CKD.  We again discussed recommendation for an AVF.  This has been a lot - states he was admitted after a doctor's appt.  States he was called for an appt next week with [place].    Review of systems:  Denies shortness of breath or chest pain  Denies n/v  Urinates in response to the 40 mg dose of lasix.   Medications:  Current Facility-Administered Medications  Medication Dose Route Frequency Provider Last Rate Last Admin  . acetaminophen (TYLENOL) tablet 650 mg  650 mg Oral Q6H PRN Raiford Noble [place], DO   650 mg at 10/11/20 0848  . carvedilol (COREG) tablet 25 mg  25 mg Oral BID WC Cox, Amy N, DO   25 mg at 10/11/20 0844  . cloNIDine (CATAPRES) tablet 0.3 mg  0.3 mg Oral Q8H Reesa Chew, MD   0.3 mg at 10/11/20 0530  . heparin injection 5,000 Units  5,000 Units Subcutaneous Q8H Cox, Amy N, DO   5,000 Units at 10/11/20 0530  . hydrALAZINE (APRESOLINE) injection 10 mg  10 mg Intravenous Q4H PRN Raiford Noble [place], DO   10 mg at 10/11/20 0725  . isosorbide-hydrALAZINE (BIDIL) 20-37.5 MG per tablet 2 tablet  2 tablet Oral BID Reesa Chew, MD   2 tablet at 10/11/20 0844  . labetalol (NORMODYNE) injection 10 mg  10 mg Intravenous Q2H PRN Raiford Noble [place], DO   10 mg at 10/11/20 0841  . ondansetron (ZOFRAN) tablet 4 mg  4 mg  Oral Q6H PRN Cox, Amy N, DO       Or  . ondansetron (ZOFRAN) injection 4 mg  4 mg Intravenous Q6H PRN Cox, Amy N, DO          Physical Exam: Vitals:   10/11/20 0725 10/11/20 1217  BP: (!) 168/123 (!) 144/98  Pulse: 67 65  Resp: 18 17  Temp: 98.6 F (37 C) 97.6 F (36.4 C)  SpO2:     Total I/O In: 240 [P.O.:240] Out: 450 [Urine:450]  Intake/Output Summary (Last 24 hours) at 10/11/2020 1300 Last data filed at 10/11/2020 1049 Gross per 24 hour  Intake 720 ml  Output 2775 ml  Net -2055 ml    General adult male in bed in no acute  distress  HEENT normocephalic atraumatic extraocular movements intact sclera anicteric Neck supple trachea midline Lungs clear to auscultation bilaterally normal work of breathing at rest  Heart S1S2 no rub Abdomen soft nontender nondistended Extremities trace edema lower extremities Psych normal mood and affect Neuro alert and oriented x3 provides hx and follows commands   Test Results I personally reviewed new and old clinical labs and radiology tests Lab Results  Component Value Date   NA 137 10/11/2020   K 3.8 10/11/2020   CL 105 10/11/2020   CO2 20 (L) 10/11/2020   BUN 68 (H) 10/11/2020   CREATININE 5.53 (H) 10/11/2020   GFR 71.39 08/20/2013   CALCIUM 8.7 (L) 10/11/2020   ALBUMIN 3.0 (L) 10/11/2020   PHOS 4.9 (H) 10/11/2020     Claudia Desanctis, MD 10/11/2020 1:23 PM

## 2020-10-11 NOTE — Discharge Summary (Signed)
Physician Discharge Summary  Ian Gregory. XFG:[id number] DOB: 1966-05-17 DOA: 10/05/2020  [facility]: [facility] date: 10/05/2020 Discharge date: 10/11/2020  Admitted From: Home Disposition: Home  Recommendations for Outpatient Follow-up:  1. Follow up with PCP in 1-2 weeks 2. Follow up with Nephrology within 1-2 weeks  3. Follow up with Urology as an outpatient  4. Follow up with Pulmonary within 1-2 weeks  5. Please obtain CMP/CBC, Mag Phos in one week 6. Please follow up on the following pending results:  Home Health: No Equipment/Devices: None  Discharge Condition: Stable CODE STATUS: FULL CODE Diet recommendation: Heart Healthy Renal Diet with 1800 mL Fluid Restriction  Brief/Interim Summary: The patient is a 55 year old obese African-American male with a past medical history significant for but not limited to hypertension, CKD stage IV, history of diastolic CHF, sarcoidosis, as well as other comorbidities who presented to the emergency room with a chief concern of shortness of breath. He reports that the shortness of breath and going on for the last month or so is worse with exertion and states that he is not able to lay flat and has to have 3 pillows being propped up to sleep. He presented to his PCP on 10/05/2020 and was told to go to the ED given his elevated blood pressure. He reported shortness of breath that has been going on for about a month and states has been difficult for him to obtain an appointment to see his PCP. He also endorses bilateral lower extremity edema that has been going on for years but acutely worsened. 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   **Interim History  Blood pressure still remains elevated and he still appears a little hypervolemic so we will continue IV diuretics and has been increased to IV 60 mg BID by nephrology yesterday and to IV 80  mg BID today.  Nephrology has made some adjustments in his blood pressure medications as well again.  Nephrology recommended continue IV diuresis today and continue monitor renal function carefully as his renal function as it is going up and they feel that the patient may need dialysis this hospitalization.  They are working up his hematuria and sending a urine culture and analyze any microscopy and we may need to discuss with urology. Urine Cx showing 40,000 CFU of Streptococus Mitis/Oralis. Patient Is off the diltiazem drip now  Discharge Diagnoses:  Principal Problem:   Hypertensive emergency Active Problems:   Accelerated hypertension   Acute renal failure superimposed on stage 3 chronic kidney disease (HCC)   Anemia of chronic renal failure, stage 3 (moderate) (HCC)   ARF (acute renal failure) (HCC)  Hypertensive emergency/urgency, improving  Accelerated hypertension -Initiated onDiltiazem gttbut will attempt to wean and is now off of the diltiazem drip -Given a Dose of IV Lasix 80 mg x1 and now has been changed to IV Lasix 40 mg every 12 but now increased to IV 60 mg BID yesterday and to IV 80 mg BID today; Nephrology recommending switching to 60 mg po Lasix BID  -Resumed home antihypertensives: Isosorbide hydralazine 20-30 7.5 twice daily, Coreg 25 mg twice daily; nephrology is added clonidine 0.1 mg p.o. 3 times daily and this has been 0.2 mg p.o. 3 times daily yesterday and to 0.3 mg po TID yesterday  -With as needed antihypertensives with labetalol 10 mg IV every 2 as needed for systolic blood pressure in 180 or  diastolic blood pressure greater 100 in addition to IV hydralazine 10 mg every 4 as needed's for SBP greater than 180 or DBP within 100 -Complete echo ordered and is as below -Continue to Monitor BP per Protocol  -Last blood pressure elevated and was 174/113 this AM and given IV Hydralazine prior to D/C and may need further medication adjustments  Acute onChronicDiastolic  CHF -last echocardiogram was done in 2018 and showed a normal EF of 55 to 60% with grade 1 diastolic dysfunction -Repeat echocardiogram this time and it showed severe concentric LVH as well as a left ventricular EF of 50 to 55% and grade 2 diastolic dysfunction -BNP was 441.9 and patient is unable to lie flat and has dyspnea on exertion along with pitting edema -IV Lasix per Nephrology changed to po Lasix for D/C -Strict I's and O's and daily weights; the patient is -9.422 L since admission and weight is down 11 lbs  -Nephrology consulted for further assistance and evaluation given his renal dysfunction -Off of Diltiazem gtt -D/C Medications as above   AcuteKidneyInjuryon CKD stage IV-suspect secondary to hypertensive emergency, worsening  MetabolicAcidosis 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 -BUN/creatinine went from 48/5.26 -> 45/5.19 -> 48/5.52 -> 53/6.01 -> 62/5.69 -> 61/5.66 -> 68/5.53 -Patient has some mild metabolic acidosis with a CO2 of 20, anion gap of 12, chloride level of 105 -Nephrology feels the patient is still hypervolemic on examination and recommending continue IV diuretics today and he is currently on IV 80 mg q12h and received a dose this AM and will change to po tomorrow  -Nephrology following and recommending to continue to monitor fluid status carefully and hold Lasix once he is euvolemic but they feel that he has had historically difficult compliance and because the patient urine sediment yesterday with prior nondysmorphic hematuria they could represent ongoing interstitial nephritis but given his significant CKD they do not feel that immunosuppression at this time is indicated. -Already has discussed that he may require dialysis during his hospitalization and that if his creatinine is not improving on Monday and the plan is likely for access placement and vascular consultation -PTH was 148 back in January  of this year; Ca2+ was 8.7 and Phos was 4.2  -Renal ultrasound done and showed no evidence of medical renal disease and no obstruction -Continue to monitor blood pressure carefully -Avoid nephrotoxic medications, contrast dyes, hypotension and renally dose medications -Repeat CMP in a.m. -Nephrology continues to have dialysis discussions with the patient and patient continues to refuse access placement thus far and continues to defer AVF -Nephrology recommends continuing supportive measures and will continue diuresis as above and transition to p.o. per nephrology recommendations as above   NormocyticAnemia/Anemia of Chronic Kidney Disease -Patient's Hgb/Hct went from 12.1/37.1 -> 12.2/37.7 -> 11.1/34.0 -> 10.9/33.7 -> 10.5/32.3 -> 10.5/32.6 -> 10.8/33.6 -Checked Anemia Panel and showed iron level of 86, U IBC 226, TIBC 312, saturation ratios of 28%, ferritin of 112, folate level of 11.0, vitamin B12 256 -Continue monitor for signs and symptoms bleeding; Currently no overt bleeding noted  -Repeat CBC in a.m.  History of hematuria and suprapubic abdominal pain-patient urine was negative for hematuria during this hospitalization -If persists with recommend a.m. team to order a CT stone chaser to assess for kidney stones -U/A done and showed moderate hemoglobin on urine dipstick, small leukocytes, negative nitrites, greater than 300 protein, no bacteria seen, 21-50 RBCs per high-powered field, 0-5 squamous epithelial cells, greater than 50 WBCs -Urine  culture is being checked and there is minimum and analysis microscopy -May need to speak with urology but nephrology believes this is nondysmorphic and could be related to granulomatous inflammation but no glomerular bleeding -Urine culture revealed 40,000 colony-forming units of Streptococcus Mitis/Oralis but Asymptomatic   Pulmonary Sarcoidosis -CXR this AM showed "There is atelectatic change in the left lower lung region. There is no edema or  airspace opacity. There is cardiomegaly with pulmonary vascularity normal. No adenopathy. No bone lesions." -He will need outpatient follow-up with his Pulmonologist  Obesity -Complicates overall prognosis and care -Estimated body mass index is 32.64 kg/m as calculated from the following:   Height as of this encounter: 6\' 3"  (1.905 m).   Weight as of this encounter: 118.4 kg. -Weight Loss and Dietary Counseling given   Discharge Instructions  Discharge Instructions    (HEART FAILURE PATIENTS) Call MD:  Anytime you have any of the following symptoms: 1) 3 pound weight gain in 24 hours or 5 pounds in 1 week 2) shortness of breath, with or without a dry hacking cough 3) swelling in the hands, feet or stomach 4) if you have to sleep on extra pillows at night in order to breathe.   Complete by: As directed    Call MD for:  difficulty breathing, headache or visual disturbances   Complete by: As directed    Call MD for:  extreme fatigue   Complete by: As directed    Call MD for:  hives   Complete by: As directed    Call MD for:  persistant dizziness or light-headedness   Complete by: As directed    Call MD for:  persistant nausea and vomiting   Complete by: As directed    Call MD for:  redness, tenderness, or signs of infection (pain, swelling, redness, odor or green/yellow discharge around incision site)   Complete by: As directed    Call MD for:  severe uncontrolled pain   Complete by: As directed    Call MD for:  temperature >100.4   Complete by: As directed    Diet - low sodium heart healthy   Complete by: As directed    Renal Diet with 1800 mL Fluid Restriction   Discharge instructions   Complete by: As directed    You were cared for by a hospitalist during your hospital stay. If you have any questions about your discharge medications or the care you received while you were in the hospital after you are discharged, you can call the unit and ask to speak with the hospitalist on  call if the hospitalist that took care of you is not available. Once you are discharged, your primary care physician will handle any further medical issues. Please note that NO REFILLS for any discharge medications will be authorized once you are discharged, as it is imperative that you return to your primary care physician (or establish a relationship with a primary care physician if you do not have one) for your aftercare needs so that they can reassess your need for medications and monitor your lab values.  Follow up with PCP, Cardiology, and Nephrology in the outpatient setting. Take all medications as prescribed. If symptoms change or worsen please return to the ED for evaluation   Increase activity slowly   Complete by: As directed      Allergies as of 10/11/2020      Reactions   Amlodipine Swelling      Medication List    STOP  taking these medications   hydrALAZINE 25 MG tablet Commonly known as: APRESOLINE   isosorbide mononitrate 30 MG 24 hr tablet Commonly known as: IMDUR     TAKE these medications   acetaminophen 325 MG tablet Commonly known as: TYLENOL Take 2 tablets (650 mg total) by mouth every 6 (six) hours as needed for mild pain, fever or headache.   atorvastatin 10 MG tablet Commonly known as: LIPITOR Take 10 mg by mouth daily.   carvedilol 25 MG tablet Commonly known as: COREG Take 1 tablet (25 mg total) by mouth 2 (two) times daily with a meal.   cloNIDine 0.3 MG tablet Commonly known as: CATAPRES Take 1 tablet (0.3 mg total) by mouth every 8 (eight) hours. What changed:   medication strength  how much to take  when to take this   fluticasone 50 MCG/ACT nasal spray Commonly known as: FLONASE Place 1 spray into both nostrils daily as needed for allergies.   furosemide 20 MG tablet Commonly known as: LASIX Take 3 tablets (60 mg total) by mouth 2 (two) times daily. Start taking on: Oct 12, 2020 What changed:   medication strength  how much to  take   isosorbide-hydrALAZINE 20-37.5 MG tablet Commonly known as: BiDil Take 2 tablets by mouth 2 (two) times daily. What changed: how much to take   ondansetron 4 MG tablet Commonly known as: ZOFRAN Take 1 tablet (4 mg total) by mouth every 6 (six) hours as needed for nausea.   Vitamin D3 50 MCG (2000 UT) capsule Take 2,000 Units by mouth daily.       Follow-up Information    [facility]. Call.   Specialty: Family Medicine Why: Follow up within 1-2 weeks Contact information: [address] [place] [address] [phone]        Rosita Fire, MD. Call.   Specialties: Nephrology, Internal Medicine Why: Follow up within 1-2 weeks Contact information: [address] [place] [address] [phone]              Allergies  Allergen Reactions  . Amlodipine Swelling   Consultations:  Nephrology  Procedures/Studies: DG Chest 2 View  Result Date: 10/05/2020 CLINICAL DATA:  Dyspnea and peripheral swelling EXAM: CHEST - 2 VIEW COMPARISON:  05/08/2017 FINDINGS: Cardiac shadow is at the upper limits of normal in size. Mild vascular congestion is seen. Mild left basilar atelectasis is noted. No bony abnormality is seen. IMPRESSION: Mild vascular congestion with left basilar atelectasis. Electronically Signed   By: Inez Catalina M.D.   On: 10/05/2020 19:37   US RENAL  Result Date: 10/06/2020 CLINICAL DATA:  Acute renal injury. EXAM: RENAL / URINARY TRACT ULTRASOUND COMPLETE COMPARISON:  None. FINDINGS: Right Kidney: Renal measurements: 8.3 cm x 5.3 cm x 4.0 cm = volume: 93.00 mL. Diffusely increased echogenicity of the renal parenchyma is seen. No mass or hydronephrosis visualized. Left Kidney: Renal measurements: 9.5 cm x 4.7 cm x 4.1 cm = volume: 94.74 mL. Diffusely increased echogenicity of the renal parenchyma is seen. No mass or hydronephrosis visualized. Bladder: Appears normal for degree of bladder distention.  Other: Limited study secondary to overlying bowel gas. IMPRESSION: Diffusely increased echogenicity of the renal parenchyma which may be secondary to medical renal disease. Electronically Signed   By: Virgina Norfolk M.D.   On: 10/06/2020 23:20   DG CHEST PORT 1 VIEW  Result Date: 10/07/2020 CLINICAL DATA:  Shortness of breath and chest pain EXAM: PORTABLE CHEST  1 VIEW COMPARISON:  Oct 05, 2020 FINDINGS: There is atelectatic change in the left lower lung region. There is no edema or airspace opacity. There is cardiomegaly with pulmonary vascularity normal. No adenopathy. No bone lesions. IMPRESSION: No edema or airspace opacity. Left lower lobe atelectasis noted. Stable cardiomegaly. Electronically Signed   By: Lowella Grip III M.D.   On: 10/07/2020 08:23   ECHOCARDIOGRAM COMPLETE  Result Date: 10/06/2020    ECHOCARDIOGRAM REPORT   Patient Name:   Ian Gregory. Date of Exam: 10/06/2020 Medical Rec #:  [id number]          Height:       75.0 in Accession #:    [id number]         Weight:       264.0 lb Date of Birth:  30-Dec-1965          BSA:          2.469 m Patient Age:    62 years           BP:           181/125 mmHg Patient Gender: M                  HR:           66 bpm. Exam Location:  Inpatient Procedure: 2D Echo, Color Doppler and Cardiac Doppler Indications:    Dyspnea  History:        Patient has prior history of Echocardiogram examinations, most                 recent 05/06/2017. CHF; Risk Factors:Hypertension.  Sonographer:    Cammy Brochure Referring Phys: [phone] AMY N COX IMPRESSIONS  1. Severe concentric LVH is present. Given the patient's history of poorly controlled hypertension, favors a diagnosis of hypertensive heart disease.  2. Left ventricular ejection fraction, by estimation, is 55 to 60%. The left ventricle has normal function. The left ventricle has no regional wall motion abnormalities. There is severe concentric left ventricular hypertrophy. Left ventricular diastolic   parameters are consistent with Grade II diastolic dysfunction (pseudonormalization). Elevated left atrial pressure.  3. Right ventricular systolic function is normal. The right ventricular size is normal. Tricuspid regurgitation signal is inadequate for assessing PA pressure.  4. The mitral valve is grossly normal. Mild mitral valve regurgitation. No evidence of mitral stenosis.  5. The aortic valve is tricuspid. Aortic valve regurgitation is trivial. Mild aortic valve sclerosis is present, with no evidence of aortic valve stenosis.  6. Aortic dilatation noted. There is mild dilatation of the aortic root, measuring 40 mm.  7. The inferior vena cava is normal in size with greater than 50% respiratory variability, suggesting right atrial pressure of 3 mmHg. FINDINGS  Left Ventricle: Left ventricular ejection fraction, by estimation, is 55 to 60%. The left ventricle has normal function. The left ventricle has no regional wall motion abnormalities. The left ventricular internal cavity size was normal in size. There is  severe concentric left ventricular hypertrophy. Left ventricular diastolic parameters are consistent with Grade II diastolic dysfunction (pseudonormalization). Elevated left atrial pressure. Right Ventricle: The right ventricular size is normal. No increase in right ventricular wall thickness. Right ventricular systolic function is normal. Tricuspid regurgitation signal is inadequate for assessing PA pressure. Left Atrium: Left atrial size was normal in size. Right Atrium: Right atrial size was normal in size. Pericardium: Trivial pericardial effusion is present. Mitral Valve: The mitral valve is grossly normal.  Mild mitral valve regurgitation. No evidence of mitral valve stenosis. Tricuspid Valve: The tricuspid valve is grossly normal. Tricuspid valve regurgitation is trivial. No evidence of tricuspid stenosis. Aortic Valve: The aortic valve is tricuspid. Aortic valve regurgitation is trivial. Mild  aortic valve sclerosis is present, with no evidence of aortic valve stenosis. Aortic valve mean gradient measures 5.0 mmHg. Aortic valve peak gradient measures 9.5 mmHg. Aortic valve area, by VTI measures 2.63 cm. Pulmonic Valve: The pulmonic valve was grossly normal. Pulmonic valve regurgitation is trivial. No evidence of pulmonic stenosis. Aorta: Aortic dilatation noted. There is mild dilatation of the aortic root, measuring 40 mm. Venous: The inferior vena cava is normal in size with greater than 50% respiratory variability, suggesting right atrial pressure of 3 mmHg. IAS/Shunts: The atrial septum is grossly normal.  LEFT VENTRICLE PLAX 2D LVIDd:         4.30 cm  Diastology LVIDs:         2.90 cm  LV e' medial:    4.79 cm/s LV PW:         2.40 cm  LV E/e' medial:  23.4 LV IVS:        2.30 cm  LV e' lateral:   5.98 cm/s LVOT diam:     2.30 cm  LV E/e' lateral: 18.7 LV SV:         72 LV SV Index:   29 LVOT Area:     4.15 cm  RIGHT VENTRICLE             IVC RV Basal diam:  4.30 cm     IVC diam: 1.40 cm RV S prime:     14.60 cm/s LEFT ATRIUM             Index       RIGHT ATRIUM           Index LA diam:        4.10 cm 1.66 cm/m  RA Area:     23.60 cm LA Vol (A2C):   83.2 ml 33.70 ml/m RA Volume:   72.70 ml  29.44 ml/m LA Vol (A4C):   78.9 ml 31.95 ml/m LA Biplane Vol: 81.9 ml 33.17 ml/m  AORTIC VALVE AV Area (Vmax):    2.75 cm AV Area (Vmean):   2.56 cm AV Area (VTI):     2.63 cm AV Vmax:           154.00 cm/s AV Vmean:          105.500 cm/s AV VTI:            0.274 m AV Peak Grad:      9.5 mmHg AV Mean Grad:      5.0 mmHg LVOT Vmax:         102.00 cm/s LVOT Vmean:        65.100 cm/s LVOT VTI:          0.174 m LVOT/AV VTI ratio: 0.63  AORTA Ao Root diam: 4.00 cm Ao Asc diam:  3.90 cm MITRAL VALVE MV Area (PHT): 3.17 cm     SHUNTS MV Decel Time: 239 msec     Systemic VTI:  0.17 m MV E velocity: 112.00 cm/s  Systemic Diam: 2.30 cm MV A velocity: 75.30 cm/s MV E/A ratio:  1.49 Eleonore Chiquito MD Electronically  signed by Eleonore Chiquito MD Signature Date/Time: 10/06/2020/3:18:10 PM    Final      Subjective: Seen and examined at bedside and still frustrated.  Felt well.  Denies any lightheadedness or dizziness.  No chest pain or shortness breath.  Wanting to go home.  No other concerns or complaints at this time and will follow up with nephrology in 1 to 2 weeks.  Discharge Exam: Vitals:   10/11/20 1217 10/11/20 1556  BP: (!) 144/98 (!) 174/113  Pulse: 65   Resp: 17   Temp: 97.6 F (36.4 C)   SpO2:     Vitals:   10/11/20 0524 10/11/20 0725 10/11/20 1217 10/11/20 1556  BP: (!) 171/128 (!) 168/123 (!) 144/98 (!) 174/113  Pulse:  67 65   Resp:  18 17   Temp:  98.6 F (37 C) 97.6 F (36.4 C)   TempSrc:  Oral Oral   SpO2:      Weight:      Height:       General: Pt is alert, awake, not in acute distress Cardiovascular: RRR, S1/S2 +, no rubs, no gallops Respiratory: Diminished bilaterally, no wheezing, no rhonchi Abdominal: Soft, NT, distended secondary body habitus, bowel sounds + Extremities: 1+ lower extremity edema, no cyanosis  The results of significant diagnostics from this hospitalization (including imaging, microbiology, ancillary and laboratory) are listed below for reference.    Microbiology: Recent Results (from the past 240 hour(s))  Resp Panel by RT-PCR (Flu A&B, Covid) Nasopharyngeal Swab     Status: None   Collection Time: 10/05/20  9:24 PM   Specimen: Nasopharyngeal Swab; Nasopharyngeal(NP) swabs in vial transport medium  Result Value Ref Range Status   SARS Coronavirus 2 by RT PCR NEGATIVE NEGATIVE Final    Comment: (NOTE) SARS-CoV-2 target nucleic acids are NOT DETECTED.  The SARS-CoV-2 RNA is generally detectable in upper respiratory specimens during the acute phase of infection. The lowest concentration of SARS-CoV-2 viral copies this assay can detect is 138 copies/mL. A negative result does not preclude SARS-Cov-2 infection and should not be used as the sole  basis for treatment or other patient management decisions. A negative result may occur with  improper specimen collection/handling, submission of specimen other than nasopharyngeal swab, presence of viral mutation(s) within the areas targeted by this assay, and inadequate number of viral copies(<138 copies/mL). A negative result must be combined with clinical observations, patient history, and epidemiological information. The expected result is Negative.  Fact Sheet for Patients:  EntrepreneurPulse.com.au  Fact Sheet for Healthcare Providers:  IncredibleEmployment.be  This test is no t yet approved or cleared by the [place] FDA and  has been authorized for detection and/or diagnosis of SARS-CoV-2 by FDA under an Emergency Use Authorization (EUA). This EUA will remain  in effect (meaning this test can be used) for the duration of the COVID-19 declaration under Section 564(b)(1) of the Act, 21 U.S.C.section 360bbb-3(b)(1), unless the authorization is terminated  or revoked sooner.       Influenza A by PCR NEGATIVE NEGATIVE Final   Influenza B by PCR NEGATIVE NEGATIVE Final    Comment: (NOTE) The Xpert Xpress SARS-CoV-2/FLU/RSV plus assay is intended as an aid in the diagnosis of influenza from Nasopharyngeal swab specimens and should not be used as a sole basis for treatment. Nasal washings and aspirates are unacceptable for Xpert Xpress SARS-CoV-2/FLU/RSV testing.  Fact Sheet for Patients: EntrepreneurPulse.com.au  Fact Sheet for Healthcare Providers: IncredibleEmployment.be  This test is not yet approved or cleared by the [place] FDA and has been authorized for detection and/or diagnosis of SARS-CoV-2 by FDA under an Emergency Use Authorization (EUA). This EUA will remain in  effect (meaning this test can be used) for the duration of the COVID-19 declaration under Section 564(b)(1) of the Act,  21 U.S.C. section 360bbb-3(b)(1), unless the authorization is terminated or revoked.  Performed at [facility], [address], [place], [place] [address]   Culture, Urine     Status: Abnormal   Collection Time: 10/07/20 10:20 AM   Specimen: Urine, Clean Catch  Result Value Ref Range Status   Specimen Description URINE, CLEAN CATCH  Final   Special Requests   Final    NONE Performed at [facility], [place] [address] [place],  [address]    Culture 40,000 COLONIES/mL STREPTOCOCCUS MITIS/ORALIS (A)  Final   Report Status 10/08/2020 FINAL  Final    Labs: BNP (last 3 results) Recent Labs    10/05/20 1949  BNP 030.0*   Basic Metabolic Panel: Recent Labs  Lab 10/07/20 0320 10/08/20 0144 10/09/20 0104 10/10/20 0239 10/11/20 0228  NA 137 139 138 138 137  K 4.1 4.1 3.9 4.1 3.8  CL 107 110 108 109 105  CO2 20* 20* 22 19* 20*  GLUCOSE 105* 103* 116* 110* 106*  BUN 48* 53* 62* 61* 68*  CREATININE 5.52* 6.01* 5.69* 5.66* 5.53*  CALCIUM 8.7* 8.7* 8.5* 8.8* 8.7*  MG 2.2 2.2 2.4 2.2 2.4  PHOS 4.2 4.4 4.6 5.0* 4.9*   Liver Function Tests: Recent Labs  Lab 10/07/20 0320 10/08/20 0144 10/09/20 0104 10/10/20 0239 10/11/20 0228  AST 16 15 14* 14* 17  ALT 16 15 15 16 18   ALKPHOS 70 69 69 65 68  BILITOT 1.2 1.1 0.4 0.7 1.0  PROT 6.1* 6.3* 6.0* 6.1* 6.5  ALBUMIN 2.9* 2.9* 2.8* 2.8* 3.0*   No results for input(s): LIPASE, AMYLASE in the last 168 hours. No results for input(s): AMMONIA in the last 168 hours. CBC: Recent Labs  Lab 10/07/20 0320 10/08/20 0144 10/09/20 0104 10/10/20 0239 10/11/20 0228  WBC 5.3 5.4 5.2 4.6 4.5  NEUTROABS 3.2 3.1 3.0 2.7 2.6  HGB 11.1* 10.9* 10.5* 10.5* 10.8*  HCT 34.0* 33.7* 32.3* 32.6* 33.6*  MCV 94.4 94.1 94.7 95.0 94.9  PLT 155 161 141* 161 162   Cardiac Enzymes: No results for input(s): CKTOTAL, CKMB, CKMBINDEX, TROPONINI in the last 168 hours. BNP: Invalid input(s): POCBNP CBG: No results for  input(s): GLUCAP in the last 168 hours. D-Dimer No results for input(s): DDIMER in the last 72 hours. Hgb A1c No results for input(s): HGBA1C in the last 72 hours. Lipid Profile No results for input(s): CHOL, HDL, LDLCALC, TRIG, CHOLHDL, LDLDIRECT in the last 72 hours. Thyroid function studies No results for input(s): TSH, T4TOTAL, T3FREE, THYROIDAB in the last 72 hours.  Invalid input(s): FREET3 Anemia work up No results for input(s): VITAMINB12, FOLATE, FERRITIN, TIBC, IRON, RETICCTPCT in the last 72 hours. Urinalysis    Component Value Date/Time   COLORURINE YELLOW 10/06/2020 1803   APPEARANCEUR HAZY (A) 10/06/2020 1803   LABSPEC 1.012 10/06/2020 1803   PHURINE 6.0 10/06/2020 1803   GLUCOSEU 50 (A) 10/06/2020 1803   HGBUR MODERATE (A) 10/06/2020 1803   BILIRUBINUR NEGATIVE 10/06/2020 1803   KETONESUR NEGATIVE 10/06/2020 1803   PROTEINUR >=300 (A) 10/06/2020 1803   UROBILINOGEN 0.2 06/27/2013 0839   NITRITE NEGATIVE 10/06/2020 1803   LEUKOCYTESUR SMALL (A) 10/06/2020 1803   Sepsis Labs Invalid input(s): PROCALCITONIN,  WBC,  LACTICIDVEN Microbiology Recent Results (from the past 240 hour(s))  Resp Panel by RT-PCR (Flu A&B, Covid) Nasopharyngeal Swab  Status: None   Collection Time: 10/05/20  9:24 PM   Specimen: Nasopharyngeal Swab; Nasopharyngeal(NP) swabs in vial transport medium  Result Value Ref Range Status   SARS Coronavirus 2 by RT PCR NEGATIVE NEGATIVE Final    Comment: (NOTE) SARS-CoV-2 target nucleic acids are NOT DETECTED.  The SARS-CoV-2 RNA is generally detectable in upper respiratory specimens during the acute phase of infection. The lowest concentration of SARS-CoV-2 viral copies this assay can detect is 138 copies/mL. A negative result does not preclude SARS-Cov-2 infection and should not be used as the sole basis for treatment or other patient management decisions. A negative result may occur with  improper specimen collection/handling, submission  of specimen other than nasopharyngeal swab, presence of viral mutation(s) within the areas targeted by this assay, and inadequate number of viral copies(<138 copies/mL). A negative result must be combined with clinical observations, patient history, and epidemiological information. The expected result is Negative.  Fact Sheet for Patients:  EntrepreneurPulse.com.au  Fact Sheet for Healthcare Providers:  IncredibleEmployment.be  This test is no t yet approved or cleared by the [place] FDA and  has been authorized for detection and/or diagnosis of SARS-CoV-2 by FDA under an Emergency Use Authorization (EUA). This EUA will remain  in effect (meaning this test can be used) for the duration of the COVID-19 declaration under Section 564(b)(1) of the Act, 21 U.S.C.section 360bbb-3(b)(1), unless the authorization is terminated  or revoked sooner.       Influenza A by PCR NEGATIVE NEGATIVE Final   Influenza B by PCR NEGATIVE NEGATIVE Final    Comment: (NOTE) The Xpert Xpress SARS-CoV-2/FLU/RSV plus assay is intended as an aid in the diagnosis of influenza from Nasopharyngeal swab specimens and should not be used as a sole basis for treatment. Nasal washings and aspirates are unacceptable for Xpert Xpress SARS-CoV-2/FLU/RSV testing.  Fact Sheet for Patients: EntrepreneurPulse.com.au  Fact Sheet for Healthcare Providers: IncredibleEmployment.be  This test is not yet approved or cleared by the [place] FDA and has been authorized for detection and/or diagnosis of SARS-CoV-2 by FDA under an Emergency Use Authorization (EUA). This EUA will remain in effect (meaning this test can be used) for the duration of the COVID-19 declaration under Section 564(b)(1) of the Act, 21 U.S.C. section 360bbb-3(b)(1), unless the authorization is terminated or revoked.  Performed at [facility], [address], [place], [place] [address]   Culture, Urine     Status: Abnormal   Collection Time: 10/07/20 10:20 AM   Specimen: Urine, Clean Catch  Result Value Ref Range Status   Specimen Description URINE, CLEAN CATCH  Final   Special Requests   Final    NONE Performed at [facility], [place] [address], [place],  [address]    Culture 40,000 COLONIES/mL STREPTOCOCCUS MITIS/ORALIS (A)  Final   Report Status 10/08/2020 FINAL  Final   Time coordinating discharge: 35 minutes  SIGNED:  Kerney Elbe, DO [facility] 10/11/2020, 6:20 PM Pager is on [place]  If 7PM-7AM, please contact night-coverage [email or web address]

## 2020-10-28 ENCOUNTER — Other Ambulatory Visit: Payer: Self-pay | Admitting: *Deleted

## 2020-10-28 DIAGNOSIS — N186 End stage renal disease: Secondary | ICD-10-CM

## 2020-11-18 ENCOUNTER — Ambulatory Visit (INDEPENDENT_AMBULATORY_CARE_PROVIDER_SITE_OTHER): Payer: Commercial Managed Care - PPO | Admitting: Vascular Surgery

## 2020-11-18 ENCOUNTER — Encounter: Payer: Self-pay | Admitting: Vascular Surgery

## 2020-11-18 ENCOUNTER — Other Ambulatory Visit: Payer: Self-pay

## 2020-11-18 ENCOUNTER — Ambulatory Visit (HOSPITAL_COMMUNITY)
Admission: RE | Admit: 2020-11-18 | Discharge: 2020-11-18 | Disposition: A | Discharge status: Home / Self Care | Payer: Commercial Managed Care - PPO | Source: Ambulatory Visit | Attending: Vascular Surgery | Admitting: Vascular Surgery

## 2020-11-18 ENCOUNTER — Ambulatory Visit (INDEPENDENT_AMBULATORY_CARE_PROVIDER_SITE_OTHER)
Admission: RE | Admit: 2020-11-18 | Discharge: 2020-11-18 | Disposition: A | Discharge status: Home / Self Care | Payer: Commercial Managed Care - PPO | Source: Ambulatory Visit | Attending: Vascular Surgery | Admitting: Vascular Surgery

## 2020-11-18 VITALS — BP 190/112 | HR 67 | Temp 97.9°F | Resp 20 | Ht 75.0 in | Wt 271.0 lb

## 2020-11-18 DIAGNOSIS — N184 Chronic kidney disease, stage 4 (severe): Secondary | ICD-10-CM | POA: Diagnosis not present

## 2020-11-18 DIAGNOSIS — N186 End stage renal disease: Secondary | ICD-10-CM

## 2020-11-18 NOTE — Progress Notes (Signed)
Patient ID: Ian Shade., male   DOB: 1965/08/10, 55 y.o.   MRN: [id number]  Reason for Consult: New Patient (Initial Visit)   Referred by [facility], [facility]  Subjective:     HPI:  Ian Uddin. is a 55 y.o. male significant family history of end-stage renal disease with his father having chronic kidney disease his mother and sister both being on dialysis.  He is right-hand dominant.  He has never been on dialysis or had dialysis access in the past.  Greatest risk factor for kidney disease and hypertension.  He denies any history of chest or upper extremity surgery and denies implanted pacemaker or defibrillator.  He is right-hand dominant  Past Medical History:  Diagnosis Date   CHF (congestive heart failure) (HCC)    CKD (chronic kidney disease) stage 3, GFR 30-59 ml/min (HCC)    HTN (hypertension)    Sarcoidosis    Family History  Problem Relation Age of Onset   Hypertension Mother    Kidney failure Mother    Diabetes Father    Hypertension Father    History reviewed. No pertinent surgical history.  Short Social History:  Social History   Tobacco Use   Smoking status: Never   Smokeless tobacco: Never  Substance Use Topics   Alcohol use: Yes    Comment: beer on wkends    Allergies  Allergen Reactions   Amlodipine Swelling    Current Outpatient Medications  Medication Sig Dispense Refill   acetaminophen (TYLENOL) 325 MG tablet Take 2 tablets (650 mg total) by mouth every 6 (six) hours as needed for mild pain, fever or headache. 20 tablet 0   atorvastatin (LIPITOR) 10 MG tablet Take 10 mg by mouth daily.     carvedilol (COREG) 25 MG tablet Take 1 tablet (25 mg total) by mouth 2 (two) times daily with a meal. 60 tablet 1   Cholecalciferol (VITAMIN D3) 50 MCG (2000 UT) capsule Take 2,000 Units by mouth daily.     cloNIDine (CATAPRES) 0.3 MG tablet Take 1 tablet (0.3 mg total) by mouth every 8 (eight) hours. 60 tablet 11   fluticasone (FLONASE) 50  MCG/ACT nasal spray Place 1 spray into both nostrils daily as needed for allergies.     furosemide (LASIX) 20 MG tablet Take 3 tablets (60 mg total) by mouth 2 (two) times daily. 60 tablet 0   isosorbide-hydrALAZINE (BIDIL) 20-37.5 MG tablet Take 2 tablets by mouth 2 (two) times daily. 60 tablet 0   ondansetron (ZOFRAN) 4 MG tablet Take 1 tablet (4 mg total) by mouth every 6 (six) hours as needed for nausea. 20 tablet 0   No current facility-administered medications for this visit.    Review of Systems  Constitutional:  Constitutional negative. HENT: HENT negative.  Eyes: Eyes negative.  Respiratory: Respiratory negative.  Cardiovascular: Cardiovascular negative.  GI: Gastrointestinal negative.  Musculoskeletal: Musculoskeletal negative.  Skin: Skin negative.  Neurological: Neurological negative. Hematologic: Hematologic/lymphatic negative.  Psychiatric: Psychiatric negative.       Objective:  Objective   Vitals:   11/18/20 1443  BP: (!) 190/112  Pulse: 67  Resp: 20  Temp: 97.9 F (36.6 C)  SpO2: 95%  Weight: 271 lb (122.9 kg)  Height: 6\' 3"  (1.905 m)   Body mass index is 33.87 kg/m.  Physical Exam HENT:     Head: Normocephalic.     Nose:     Comments: Wearing a mask Eyes:     Extraocular Movements: Extraocular  movements intact.     Pupils: Pupils are equal, round, and reactive to light.  Cardiovascular:     Rate and Rhythm: Normal rate.     Pulses: Normal pulses.  Pulmonary:     Effort: Pulmonary effort is normal.  Abdominal:     General: Abdomen is flat.     Palpations: Abdomen is soft.  Musculoskeletal:        General: Normal range of motion.     Cervical back: Normal range of motion.     Right lower leg: No edema.     Left lower leg: No edema.  Skin:    General: Skin is warm.     Capillary Refill: Capillary refill takes less than 2 seconds.  Neurological:     General: No focal deficit present.     Mental Status: He is alert.  Psychiatric:         Mood and Affect: Mood normal.        Behavior: Behavior normal.        Thought Content: Thought content normal.        Judgment: Judgment normal.    Data: Right Pre-Dialysis Findings:  +-----------------------+----------+--------------------+---------+--------  +  Location               PSV (cm/s)Intralum. Diam. (cm)Waveform  Comments  +-----------------------+----------+--------------------+---------+--------  +  Brachial Antecub. fossa82        0.57                triphasic            +-----------------------+----------+--------------------+---------+--------  +  Radial Art at Wrist    62        0.35                triphasic            +-----------------------+----------+--------------------+---------+--------  +  Ulnar Art at Wrist     56        0.29                triphasic            +-----------------------+----------+--------------------+---------+--------  +   Left Pre-Dialysis Findings:  +-----------------------+----------+--------------------+---------+--------  +  Location               PSV (cm/s)Intralum. Diam. (cm)Waveform  Comments  +-----------------------+----------+--------------------+---------+--------  +  Brachial Antecub. fossa62        0.69                triphasic            +-----------------------+----------+--------------------+---------+--------  +  Radial Art at Wrist    67        0.35                triphasic            +-----------------------+----------+--------------------+---------+--------  +  Ulnar Art at Wrist     60        0.25                triphasic            +-----------------------+----------+--------------------+---------+--------  +  Summary:     Right: No obstruction visualized in the right upper extremity.  Left: No obstruction visualized in the left upper extremity.    +-----------------+-------------+----------+--------+  Right Cephalic   Diameter (cm)Depth  (cm)Findings  +-----------------+-------------+----------+--------+  Shoulder             0.33                         +-----------------+-------------+----------+--------+  Prox upper arm       0.32                         +-----------------+-------------+----------+--------+  Mid upper arm        0.33                         +-----------------+-------------+----------+--------+  Dist upper arm       0.33                         +-----------------+-------------+----------+--------+  Antecubital fossa    0.30                         +-----------------+-------------+----------+--------+  Prox forearm         0.34                         +-----------------+-------------+----------+--------+  Mid forearm          0.33                         +-----------------+-------------+----------+--------+  Dist forearm         0.30                         +-----------------+-------------+----------+--------+   +-----------------+-------------+----------+--------+  Right Basilic    Diameter (cm)Depth (cm)Findings  +-----------------+-------------+----------+--------+  Prox upper arm       0.57                         +-----------------+-------------+----------+--------+  Mid upper arm        0.61                         +-----------------+-------------+----------+--------+  Dist upper arm       0.54                         +-----------------+-------------+----------+--------+  Antecubital fossa    0.36                         +-----------------+-------------+----------+--------+  Prox forearm         0.31                         +-----------------+-------------+----------+--------+   +-----------------+-------------+----------+--------+  Left Cephalic    Diameter (cm)Depth (cm)Findings  +-----------------+-------------+----------+--------+  Shoulder             0.41                          +-----------------+-------------+----------+--------+  Prox upper arm       0.29                         +-----------------+-------------+----------+--------+  Mid upper arm        0.36                         +-----------------+-------------+----------+--------+  Dist upper arm       0.32                         +-----------------+-------------+----------+--------+  Antecubital fossa    0.37                         +-----------------+-------------+----------+--------+  Prox forearm         0.33                         +-----------------+-------------+----------+--------+  Mid forearm          0.25                         +-----------------+-------------+----------+--------+  Dist forearm         0.23                         +-----------------+-------------+----------+--------+   +-----------------+-------------+----------+--------+  Left Basilic     Diameter (cm)Depth (cm)Findings  +-----------------+-------------+----------+--------+  Prox upper arm       0.53                         +-----------------+-------------+----------+--------+  Mid upper arm        0.49                         +-----------------+-------------+----------+--------+  Dist upper arm       0.51                         +-----------------+-------------+----------+--------+  Antecubital fossa    0.56                         +-----------------+-------------+----------+--------+  Prox forearm         0.54                         +-----------------+-------------+----------+--------+   Summary: Right: Cephalic and basilic veins identified with diameters as         described above.  Left: Cephalic and basilic veins identified with diameters as        described above.      Assessment/Plan:    55 year old male with chronic kidney disease now indicated for dialysis access creation.  He is right-hand dominant.  We will begin with left arm AV fistula.   I discussed with him his options being catheter, fistula versus graft with fistula being preferable.  He has marginal cephalic vein in the upper arm but should have suitable basilic vein.  I have discussed the risks including primary nonfunction, need for further procedures particularly with basilic vein definitively requiring a second procedure.  We discussed the risk of injury to nerves or surrounding structures including arteries and veins.  He demonstrates good understanding we will get him scheduled for left arm AV fistula in the near future.  Waynetta Sandy MD [facility]

## 2020-11-18 NOTE — H&P (View-Only) (Signed)
Patient ID: Ian Shade., male   DOB: 10-10-1965, 55 y.o.   MRN: [id number]  Reason for Consult: New Patient (Initial Visit)   Referred by Associates, [facility]  Subjective:     HPI:  Ian Petersen. is a 55 y.o. male significant family history of end-stage renal disease with his father having chronic kidney disease his mother and sister both being on dialysis.  He is right-hand dominant.  He has never been on dialysis or had dialysis access in the past.  Greatest risk factor for kidney disease and hypertension.  He denies any history of chest or upper extremity surgery and denies implanted pacemaker or defibrillator.  He is right-hand dominant  Past Medical History:  Diagnosis Date   CHF (congestive heart failure) (HCC)    CKD (chronic kidney disease) stage 3, GFR 30-59 ml/min (HCC)    HTN (hypertension)    Sarcoidosis    Family History  Problem Relation Age of Onset   Hypertension Mother    Kidney failure Mother    Diabetes Father    Hypertension Father    History reviewed. No pertinent surgical history.  Short Social History:  Social History   Tobacco Use   Smoking status: Never   Smokeless tobacco: Never  Substance Use Topics   Alcohol use: Yes    Comment: beer on wkends    Allergies  Allergen Reactions   Amlodipine Swelling    Current Outpatient Medications  Medication Sig Dispense Refill   acetaminophen (TYLENOL) 325 MG tablet Take 2 tablets (650 mg total) by mouth every 6 (six) hours as needed for mild pain, fever or headache. 20 tablet 0   atorvastatin (LIPITOR) 10 MG tablet Take 10 mg by mouth daily.     carvedilol (COREG) 25 MG tablet Take 1 tablet (25 mg total) by mouth 2 (two) times daily with a meal. 60 tablet 1   Cholecalciferol (VITAMIN D3) 50 MCG (2000 UT) capsule Take 2,000 Units by mouth daily.     cloNIDine (CATAPRES) 0.3 MG tablet Take 1 tablet (0.3 mg total) by mouth every 8 (eight) hours. 60 tablet 11   fluticasone (FLONASE) 50  MCG/ACT nasal spray Place 1 spray into both nostrils daily as needed for allergies.     furosemide (LASIX) 20 MG tablet Take 3 tablets (60 mg total) by mouth 2 (two) times daily. 60 tablet 0   isosorbide-hydrALAZINE (BIDIL) 20-37.5 MG tablet Take 2 tablets by mouth 2 (two) times daily. 60 tablet 0   ondansetron (ZOFRAN) 4 MG tablet Take 1 tablet (4 mg total) by mouth every 6 (six) hours as needed for nausea. 20 tablet 0   No current facility-administered medications for this visit.    Review of Systems  Constitutional:  Constitutional negative. HENT: HENT negative.  Eyes: Eyes negative.  Respiratory: Respiratory negative.  Cardiovascular: Cardiovascular negative.  GI: Gastrointestinal negative.  Musculoskeletal: Musculoskeletal negative.  Skin: Skin negative.  Neurological: Neurological negative. Hematologic: Hematologic/lymphatic negative.  Psychiatric: Psychiatric negative.       Objective:  Objective   Vitals:   11/18/20 1443  BP: (!) 190/112  Pulse: 67  Resp: 20  Temp: 97.9 F (36.6 C)  SpO2: 95%  Weight: 271 lb (122.9 kg)  Height: 6\' 3"  (1.905 m)   Body mass index is 33.87 kg/m.  Physical Exam HENT:     Head: Normocephalic.     Nose:     Comments: Wearing a mask Eyes:     Extraocular Movements: Extraocular  movements intact.     Pupils: Pupils are equal, round, and reactive to light.  Cardiovascular:     Rate and Rhythm: Normal rate.     Pulses: Normal pulses.  Pulmonary:     Effort: Pulmonary effort is normal.  Abdominal:     General: Abdomen is flat.     Palpations: Abdomen is soft.  Musculoskeletal:        General: Normal range of motion.     Cervical back: Normal range of motion.     Right lower leg: No edema.     Left lower leg: No edema.  Skin:    General: Skin is warm.     Capillary Refill: Capillary refill takes less than 2 seconds.  Neurological:     General: No focal deficit present.     Mental Status: He is alert.  Psychiatric:         Mood and Affect: Mood normal.        Behavior: Behavior normal.        Thought Content: Thought content normal.        Judgment: Judgment normal.    Data: Right Pre-Dialysis Findings:  +-----------------------+----------+--------------------+---------+--------  +  Location               PSV (cm/s)Intralum. Diam. (cm)Waveform  Comments  +-----------------------+----------+--------------------+---------+--------  +  Brachial Antecub. fossa82        0.57                triphasic            +-----------------------+----------+--------------------+---------+--------  +  Radial Art at Wrist    62        0.35                triphasic            +-----------------------+----------+--------------------+---------+--------  +  Ulnar Art at Wrist     56        0.29                triphasic            +-----------------------+----------+--------------------+---------+--------  +   Left Pre-Dialysis Findings:  +-----------------------+----------+--------------------+---------+--------  +  Location               PSV (cm/s)Intralum. Diam. (cm)Waveform  Comments  +-----------------------+----------+--------------------+---------+--------  +  Brachial Antecub. fossa62        0.69                triphasic            +-----------------------+----------+--------------------+---------+--------  +  Radial Art at Wrist    67        0.35                triphasic            +-----------------------+----------+--------------------+---------+--------  +  Ulnar Art at Wrist     60        0.25                triphasic            +-----------------------+----------+--------------------+---------+--------  +  Summary:     Right: No obstruction visualized in the right upper extremity.  Left: No obstruction visualized in the left upper extremity.    +-----------------+-------------+----------+--------+  Right Cephalic   Diameter (cm)Depth  (cm)Findings  +-----------------+-------------+----------+--------+  Shoulder             0.33                         +-----------------+-------------+----------+--------+  Prox upper arm       0.32                         +-----------------+-------------+----------+--------+  Mid upper arm        0.33                         +-----------------+-------------+----------+--------+  Dist upper arm       0.33                         +-----------------+-------------+----------+--------+  Antecubital fossa    0.30                         +-----------------+-------------+----------+--------+  Prox forearm         0.34                         +-----------------+-------------+----------+--------+  Mid forearm          0.33                         +-----------------+-------------+----------+--------+  Dist forearm         0.30                         +-----------------+-------------+----------+--------+   +-----------------+-------------+----------+--------+  Right Basilic    Diameter (cm)Depth (cm)Findings  +-----------------+-------------+----------+--------+  Prox upper arm       0.57                         +-----------------+-------------+----------+--------+  Mid upper arm        0.61                         +-----------------+-------------+----------+--------+  Dist upper arm       0.54                         +-----------------+-------------+----------+--------+  Antecubital fossa    0.36                         +-----------------+-------------+----------+--------+  Prox forearm         0.31                         +-----------------+-------------+----------+--------+   +-----------------+-------------+----------+--------+  Left Cephalic    Diameter (cm)Depth (cm)Findings  +-----------------+-------------+----------+--------+  Shoulder             0.41                          +-----------------+-------------+----------+--------+  Prox upper arm       0.29                         +-----------------+-------------+----------+--------+  Mid upper arm        0.36                         +-----------------+-------------+----------+--------+  Dist upper arm       0.32                         +-----------------+-------------+----------+--------+  Antecubital fossa    0.37                         +-----------------+-------------+----------+--------+  Prox forearm         0.33                         +-----------------+-------------+----------+--------+  Mid forearm          0.25                         +-----------------+-------------+----------+--------+  Dist forearm         0.23                         +-----------------+-------------+----------+--------+   +-----------------+-------------+----------+--------+  Left Basilic     Diameter (cm)Depth (cm)Findings  +-----------------+-------------+----------+--------+  Prox upper arm       0.53                         +-----------------+-------------+----------+--------+  Mid upper arm        0.49                         +-----------------+-------------+----------+--------+  Dist upper arm       0.51                         +-----------------+-------------+----------+--------+  Antecubital fossa    0.56                         +-----------------+-------------+----------+--------+  Prox forearm         0.54                         +-----------------+-------------+----------+--------+   Summary: Right: Cephalic and basilic veins identified with diameters as         described above.  Left: Cephalic and basilic veins identified with diameters as        described above.      Assessment/Plan:    55 year old male with chronic kidney disease now indicated for dialysis access creation.  He is right-hand dominant.  We will begin with left arm AV fistula.   I discussed with him his options being catheter, fistula versus graft with fistula being preferable.  He has marginal cephalic vein in the upper arm but should have suitable basilic vein.  I have discussed the risks including primary nonfunction, need for further procedures particularly with basilic vein definitively requiring a second procedure.  We discussed the risk of injury to nerves or surrounding structures including arteries and veins.  He demonstrates good understanding we will get him scheduled for left arm AV fistula in the near future.  Waynetta Sandy MD [facility]

## 2020-12-02 ENCOUNTER — Other Ambulatory Visit: Payer: Self-pay

## 2020-12-02 ENCOUNTER — Encounter (HOSPITAL_COMMUNITY): Payer: Self-pay | Admitting: Vascular Surgery

## 2020-12-02 NOTE — Progress Notes (Signed)
Spoke with pt for pre-op call. Pt has hx of CHF, HTN and stage 4 kidney disease. States he is not diabetic. He is not on dialysis as of yet.   Pt's surgery is scheduled as ambulatory so no Covid test is required prior to surgery. Pt denies any recent symptoms of Covid.

## 2020-12-05 ENCOUNTER — Encounter (HOSPITAL_COMMUNITY): Payer: Self-pay | Admitting: Vascular Surgery

## 2020-12-05 NOTE — Anesthesia Preprocedure Evaluation (Addendum)
Anesthesia Evaluation  Patient identified by MRN, date of birth, ID band Patient awake    Reviewed: Allergy & Precautions, NPO status , Patient's Chart, lab work & pertinent test results  Airway Mallampati: II  TM Distance: >3 FB Neck ROM: Full    Dental  (+) Poor Dentition, Missing, Chipped,    Pulmonary    Pulmonary exam normal breath sounds clear to auscultation       Cardiovascular hypertension (poorly controlled), Pt. on medications CHF: normal EF.  Normal cardiovascular exam Rhythm:Regular Rate:Normal  Echo 10/06/20: IMPRESSIONS  1. Severe concentric LVH is present. Given the patient's history of  poorly controlled hypertension, favors a diagnosis of hypertensive heart  disease.  2. Left ventricular ejection fraction, by estimation, is 55 to 60%. The  left ventricle has normal function. The left ventricle has no regional  wall motion abnormalities. There is severe concentric left ventricular  hypertrophy. Left ventricular diastolic  parameters are consistent with Grade II diastolic dysfunction  (pseudonormalization). Elevated left atrial pressure.  3. Right ventricular systolic function is normal. The right ventricular  size is normal. Tricuspid regurgitation signal is inadequate for assessing  PA pressure.  4. The mitral valve is grossly normal. Mild mitral valve regurgitation.  No evidence of mitral stenosis.  5. The aortic valve is tricuspid. Aortic valve regurgitation is trivial.  Mild aortic valve sclerosis is present, with no evidence of aortic valve  stenosis.  6. Aortic dilatation noted. There is mild dilatation of the aortic root,  measuring 40 mm.  7. The inferior vena cava is normal in size with greater than 50%  respiratory variability, suggesting right atrial pressure of 3 mmHg.   Neuro/Psych negative neurological ROS  negative psych ROS   GI/Hepatic GERD  ,  Endo/Other  negative endocrine  ROS  Renal/GU Renal Insufficiency and ESRFRenal disease  negative genitourinary   Musculoskeletal  (+) Arthritis , Osteoarthritis,    Abdominal   Peds negative pediatric ROS (+)  Hematology  (+) anemia ,   Anesthesia Other Findings   Reproductive/Obstetrics negative OB ROS                           Anesthesia Physical Anesthesia Plan  ASA: 4  Anesthesia Plan: General   Post-op Pain Management:    Induction: Intravenous  PONV Risk Score and Plan: 2 and Ondansetron, Dexamethasone and Midazolam  Airway Management Planned: LMA  Additional Equipment:   Intra-op Plan:   Post-operative Plan: Extubation in OR  Informed Consent: I have reviewed the patients History and Physical, chart, labs and discussed the procedure including the risks, benefits and alternatives for the proposed anesthesia with the patient or authorized representative who has indicated his/her understanding and acceptance.     Dental advisory given  Plan Discussed with: Anesthesiologist and CRNA  Anesthesia Plan Comments: (Very poor dentition. Advised he is at risk for dislodgement of a tooth. He understands and agrees to proceed. Norton Blizzard, MD      PAT note written 12/05/2020 by Myra Gianotti, PA-C. )      Anesthesia Quick Evaluation

## 2020-12-05 NOTE — Progress Notes (Signed)
Anesthesia Chart Review:  Case: [id number] Date/Time: 12/06/20 0715   Procedure: LEFT ARM ARTERIOVENOUS (AV) FISTULA CREATION VERSUS GRAFT PLACEMENT (Left)   Anesthesia type: Choice   Pre-op diagnosis: ESRD   Location: [facility] OR ROOM 11 / [facility]   Surgeons: Waynetta Sandy, MD       DISCUSSION: Patient is a 55 year old male scheduled for the above procedure.  History includes never smoker, HTN, Sarcoidosis, CKD stage IV, CHF, GERD, anemia, hernia repair.  [place] admission 10/05/20-10/11/20 for HTN and dyspnea. His PCP sent him to the ED for BP 180/112 and concern for acute CHF. BP 217/145! Labs showed worsening CKD with Cr > 5. Admitted for hypertensive emergency,acute CHF in the setting of acute on chronic renal failure. He was initially treated with IV diuretics and antihypertensives. Nephrology consulted and made some adjustments in BP medications. If Creatinine not improving then would plan vascular surgery consult for hemodialysis access--patient refused consideration of hemodialysis access while hospitalized. Out-patient urology follow-up for hematuria in setting of AKI and asymptomatic 40K colonies/mL Strep mitis/oralis on urine culture. Out-patient follow-up with pulmonology for known pulmonary sarcoidosis. He was discharged home on carvedilol 25 mg BID, clonidine 0.3 mg Q 8 hours, Lasix 60 mg BID, isosorbide-hydralazine 20-37/5 mg 2 tablets BID--by medication list he is now taking hydralazine 50 mg TID and Imdur 30 mg daily (instead of isosorbide-hydralazine) and is up to Lasix 80 mg BID with same doses of Coreg and clonidine.   Since hosptialization, he was referred to vascular surgery by his nephrologist Dr. Carolin Sicks, notes are not currently available to me. He is a same day work-up. BP on 11/18/20 was 190/112, otherwise no recent BP trends available. He is on 5 BP medications as outlined above. He will get vitals, labs, and anesthesia team evaluation on the day of surgery.    VS:  From 11/18/20 VVS visit: BP: (!) 190/112  Pulse: 67  Resp: 20  Temp: 97.9 F (36.6 C)  SpO2: 95%  Weight: 271 lb (122.9 kg)  Height: 6\' 3"  (1.905 m)    PROVIDERS: [facility], [facility] - Dr .Lawson Radar is nephrologist. He has also been referred to the [facility] department to be considered for future renal transplant; however, referral closed after he did not attend the education class requested.    LABS: For day of surgery as indicated. As of 10/11/20, Cr 5.53, glucose 106, H/H 10.8/33.6, PLT 162.   IMAGES: 1V PCXR 10/07/20: FINDINGS: There is atelectatic change in the left lower lung region. There is no edema or airspace opacity. There is cardiomegaly with pulmonary vascularity normal. No adenopathy. No bone lesions. IMPRESSION: No edema or airspace opacity. Left lower lobe atelectasis noted. Stable cardiomegaly.   US Renal 10/06/20: IMPRESSION: Diffusely increased echogenicity of the renal parenchyma which may be secondary to medical renal disease.   EKG: 10/05/20:  Sinus rhythm Prolonged PR interval Probable left atrial enlargement Right bundle branch block Confirmed by Sherwood Gambler [phone]) on 10/05/2020 8:39:39 PM - ST abnormality in anterior leads with ~ 1 mm elevation on ED presentation for hypertensive emergency with admission. 10/06/20 echo LVEF 55-60%, no regional wall motion abnormalities, severe LVH, mild MR.     CV: Echo 10/06/20: IMPRESSIONS   1. Severe concentric LVH is present. Given the patient's history of  poorly controlled hypertension, favors a diagnosis of hypertensive heart  disease.   2. Left ventricular ejection fraction, by estimation, is 55 to 60%. The  left ventricle  has normal function. The left ventricle has no regional  wall motion abnormalities. There is severe concentric left ventricular  hypertrophy. Left ventricular diastolic   parameters are consistent with Grade II diastolic  dysfunction  (pseudonormalization). Elevated left atrial pressure.   3. Right ventricular systolic function is normal. The right ventricular  size is normal. Tricuspid regurgitation signal is inadequate for assessing  PA pressure.   4. The mitral valve is grossly normal. Mild mitral valve regurgitation.  No evidence of mitral stenosis.   5. The aortic valve is tricuspid. Aortic valve regurgitation is trivial.  Mild aortic valve sclerosis is present, with no evidence of aortic valve  stenosis.   6. Aortic dilatation noted. There is mild dilatation of the aortic root,  measuring 40 mm.   7. The inferior vena cava is normal in size with greater than 50%  respiratory variability, suggesting right atrial pressure of 3 mmHg.    Past Medical History:  Diagnosis Date   Anemia    Arthritis    CHF (congestive heart failure) (HCC)    CKD (chronic kidney disease) stage 3, GFR 30-59 ml/min (HCC)    Stage 4   GERD (gastroesophageal reflux disease)    HTN (hypertension)    Pneumonia    Sarcoidosis     Past Surgical History:  Procedure Laterality Date   HERNIA REPAIR     umbilical hernia as a baby    MEDICATIONS: No current facility-administered medications for this encounter.    acetaminophen (TYLENOL) 325 MG tablet   atorvastatin (LIPITOR) 10 MG tablet   carvedilol (COREG) 25 MG tablet   Cholecalciferol (VITAMIN D3) 50 MCG (2000 UT) capsule   cloNIDine (CATAPRES) 0.3 MG tablet   fluticasone (FLONASE) 50 MCG/ACT nasal spray   furosemide (LASIX) 20 MG tablet   hydrALAZINE (APRESOLINE) 100 MG tablet   isosorbide mononitrate (IMDUR) 30 MG 24 hr tablet   isosorbide-hydrALAZINE (BIDIL) 20-37.5 MG tablet   ondansetron (ZOFRAN) 4 MG tablet    Myra Gianotti, PA-C Surgical Short Stay/Anesthesiology [facility] Phone [phone] [facility] Phone [phone] 12/05/2020 3:18 PM

## 2020-12-06 ENCOUNTER — Other Ambulatory Visit: Payer: Self-pay

## 2020-12-06 ENCOUNTER — Ambulatory Visit (HOSPITAL_COMMUNITY)
Admission: RE | Admit: 2020-12-06 | Discharge: 2020-12-06 | Disposition: A | Discharge status: Home / Self Care | Payer: Commercial Managed Care - PPO | Source: Ambulatory Visit | Attending: Vascular Surgery | Admitting: Vascular Surgery

## 2020-12-06 ENCOUNTER — Ambulatory Visit (HOSPITAL_COMMUNITY): Payer: Commercial Managed Care - PPO | Admitting: Physician Assistant

## 2020-12-06 ENCOUNTER — Encounter (HOSPITAL_COMMUNITY)
Admission: RE | Disposition: A | Discharge status: Home / Self Care | Payer: Self-pay | Source: Ambulatory Visit | Attending: Vascular Surgery

## 2020-12-06 ENCOUNTER — Encounter (HOSPITAL_COMMUNITY): Payer: Self-pay | Admitting: Vascular Surgery

## 2020-12-06 DIAGNOSIS — I132 Hypertensive heart and chronic kidney disease with heart failure and with stage 5 chronic kidney disease, or end stage renal disease: Secondary | ICD-10-CM | POA: Insufficient documentation

## 2020-12-06 DIAGNOSIS — Z79899 Other long term (current) drug therapy: Secondary | ICD-10-CM | POA: Insufficient documentation

## 2020-12-06 DIAGNOSIS — N186 End stage renal disease: Secondary | ICD-10-CM | POA: Diagnosis not present

## 2020-12-06 DIAGNOSIS — I509 Heart failure, unspecified: Secondary | ICD-10-CM | POA: Diagnosis not present

## 2020-12-06 DIAGNOSIS — Z8249 Family history of ischemic heart disease and other diseases of the circulatory system: Secondary | ICD-10-CM | POA: Insufficient documentation

## 2020-12-06 DIAGNOSIS — N185 Chronic kidney disease, stage 5: Secondary | ICD-10-CM

## 2020-12-06 DIAGNOSIS — Z888 Allergy status to other drugs, medicaments and biological substances status: Secondary | ICD-10-CM | POA: Diagnosis not present

## 2020-12-06 HISTORY — DX: Pneumonia, unspecified organism: J18.9

## 2020-12-06 HISTORY — DX: Unspecified osteoarthritis, unspecified site: M19.90

## 2020-12-06 HISTORY — DX: Gastro-esophageal reflux disease without esophagitis: K21.9

## 2020-12-06 HISTORY — DX: Anemia, unspecified: D64.9

## 2020-12-06 HISTORY — PX: AV FISTULA PLACEMENT: SHX1204

## 2020-12-06 LAB — NO BLOOD PRODUCTS

## 2020-12-06 LAB — POCT I-STAT, CHEM 8
BUN: 43 mg/dL — ABNORMAL HIGH (ref 6–20)
Calcium, Ion: 1.12 mmol/L — ABNORMAL LOW (ref 1.15–1.40)
Chloride: 109 mmol/L (ref 98–111)
Creatinine, Ser: 6.1 mg/dL — ABNORMAL HIGH (ref 0.61–1.24)
Glucose, Bld: 107 mg/dL — ABNORMAL HIGH (ref 70–99)
HCT: 32 % — ABNORMAL LOW (ref 39.0–52.0)
Hemoglobin: 10.9 g/dL — ABNORMAL LOW (ref 13.0–17.0)
Potassium: 4 mmol/L (ref 3.5–5.1)
Sodium: 139 mmol/L (ref 135–145)
TCO2: 19 mmol/L — ABNORMAL LOW (ref 22–32)

## 2020-12-06 SURGERY — ARTERIOVENOUS (AV) FISTULA CREATION
Anesthesia: General | Laterality: Left

## 2020-12-06 MED ORDER — CHLORHEXIDINE GLUCONATE 4 % EX LIQD: 60.0000 mL | Freq: Once | CUTANEOUS | Status: DC

## 2020-12-06 MED ORDER — ORAL CARE MOUTH RINSE: 15.0000 mL | Freq: Once | OROMUCOSAL | Status: AC

## 2020-12-06 MED ORDER — OXYCODONE HCL 5 MG PO TABS: 5.0000 mg | ORAL_TABLET | Freq: Once | ORAL | Status: DC | PRN

## 2020-12-06 MED ORDER — ONDANSETRON HCL 4 MG/2ML IJ SOLN
INTRAMUSCULAR | Status: DC | PRN
  Administered 2020-12-06: 4 mg via INTRAVENOUS

## 2020-12-06 MED ORDER — LIDOCAINE HCL (PF) 2 % IJ SOLN
INTRAMUSCULAR | Status: DC | PRN
  Administered 2020-12-06: 60 mg via INTRADERMAL

## 2020-12-06 MED ORDER — EPHEDRINE SULFATE 50 MG/ML IJ SOLN
INTRAMUSCULAR | Status: DC | PRN
  Administered 2020-12-06 (×2): 5 mg via INTRAVENOUS

## 2020-12-06 MED ORDER — LIDOCAINE 2% (20 MG/ML) 5 ML SYRINGE
INTRAMUSCULAR | Status: AC
  Filled 2020-12-06: qty 5

## 2020-12-06 MED ORDER — SODIUM CHLORIDE 0.9 % IV SOLN: INTRAVENOUS | Status: DC

## 2020-12-06 MED ORDER — PROMETHAZINE HCL 25 MG/ML IJ SOLN: 6.2500 mg | INTRAMUSCULAR | Status: DC | PRN

## 2020-12-06 MED ORDER — OXYCODONE HCL 5 MG/5ML PO SOLN: 5.0000 mg | Freq: Once | ORAL | Status: DC | PRN

## 2020-12-06 MED ORDER — 0.9 % SODIUM CHLORIDE (POUR BTL) OPTIME
TOPICAL | Status: DC | PRN
  Administered 2020-12-06: 1000 mL

## 2020-12-06 MED ORDER — EPHEDRINE 5 MG/ML INJ
INTRAVENOUS | Status: AC
  Filled 2020-12-06: qty 5

## 2020-12-06 MED ORDER — CEFAZOLIN SODIUM-DEXTROSE 2-3 GM-%(50ML) IV SOLR: INTRAVENOUS | Status: DC | PRN

## 2020-12-06 MED ORDER — DEXAMETHASONE SODIUM PHOSPHATE 10 MG/ML IJ SOLN
INTRAMUSCULAR | Status: AC
  Filled 2020-12-06: qty 1

## 2020-12-06 MED ORDER — FENTANYL CITRATE (PF) 100 MCG/2ML IJ SOLN
25.0000 ug | INTRAMUSCULAR | Status: DC | PRN
  Administered 2020-12-06: 50 ug via INTRAVENOUS

## 2020-12-06 MED ORDER — DEXAMETHASONE SODIUM PHOSPHATE 10 MG/ML IJ SOLN
INTRAMUSCULAR | Status: DC | PRN
  Administered 2020-12-06: 10 mg via INTRAVENOUS

## 2020-12-06 MED ORDER — CEFAZOLIN IN SODIUM CHLORIDE 3-0.9 GM/100ML-% IV SOLN
3.0000 g | INTRAVENOUS | Status: AC
  Administered 2020-12-06: 3 g via INTRAVENOUS
  Filled 2020-12-06: qty 100

## 2020-12-06 MED ORDER — FENTANYL CITRATE (PF) 100 MCG/2ML IJ SOLN
INTRAMUSCULAR | Status: AC
  Filled 2020-12-06: qty 2

## 2020-12-06 MED ORDER — PROPOFOL 10 MG/ML IV BOLUS
INTRAVENOUS | Status: AC
  Filled 2020-12-06: qty 40

## 2020-12-06 MED ORDER — DROPERIDOL 2.5 MG/ML IJ SOLN: 0.6250 mg | Freq: Once | INTRAMUSCULAR | Status: DC | PRN

## 2020-12-06 MED ORDER — PHENYLEPHRINE 40 MCG/ML (10ML) SYRINGE FOR IV PUSH (FOR BLOOD PRESSURE SUPPORT)
PREFILLED_SYRINGE | INTRAVENOUS | Status: DC | PRN
  Administered 2020-12-06: 80 ug via INTRAVENOUS
  Administered 2020-12-06: 40 ug via INTRAVENOUS
  Administered 2020-12-06: 120 ug via INTRAVENOUS

## 2020-12-06 MED ORDER — OXYCODONE-ACETAMINOPHEN 5-325 MG PO TABS: 1.0000 | ORAL_TABLET | ORAL | 0 refills | Status: DC | PRN

## 2020-12-06 MED ORDER — HEPARIN 6000 UNIT IRRIGATION SOLUTION
Status: AC
  Filled 2020-12-06: qty 500

## 2020-12-06 MED ORDER — MIDAZOLAM HCL 2 MG/2ML IJ SOLN
INTRAMUSCULAR | Status: DC | PRN
  Administered 2020-12-06: 2 mg via INTRAVENOUS

## 2020-12-06 MED ORDER — ONDANSETRON HCL 4 MG/2ML IJ SOLN
INTRAMUSCULAR | Status: AC
  Filled 2020-12-06: qty 2

## 2020-12-06 MED ORDER — LIDOCAINE-EPINEPHRINE 1 %-1:100000 IJ SOLN
INTRAMUSCULAR | Status: AC
  Filled 2020-12-06: qty 1

## 2020-12-06 MED ORDER — FENTANYL CITRATE (PF) 250 MCG/5ML IJ SOLN
INTRAMUSCULAR | Status: AC
  Filled 2020-12-06: qty 5

## 2020-12-06 MED ORDER — HEPARIN 6000 UNIT IRRIGATION SOLUTION
Status: DC | PRN
  Administered 2020-12-06: 1

## 2020-12-06 MED ORDER — FENTANYL CITRATE (PF) 100 MCG/2ML IJ SOLN
INTRAMUSCULAR | Status: DC | PRN
  Administered 2020-12-06: 50 ug via INTRAVENOUS

## 2020-12-06 MED ORDER — PHENYLEPHRINE 40 MCG/ML (10ML) SYRINGE FOR IV PUSH (FOR BLOOD PRESSURE SUPPORT)
PREFILLED_SYRINGE | INTRAVENOUS | Status: AC
  Filled 2020-12-06: qty 10

## 2020-12-06 MED ORDER — MIDAZOLAM HCL 2 MG/2ML IJ SOLN
INTRAMUSCULAR | Status: AC
  Filled 2020-12-06: qty 2

## 2020-12-06 MED ORDER — PROPOFOL 10 MG/ML IV BOLUS
INTRAVENOUS | Status: DC | PRN
  Administered 2020-12-06: 50 mg via INTRAVENOUS
  Administered 2020-12-06: 200 mg via INTRAVENOUS

## 2020-12-06 MED ORDER — CHLORHEXIDINE GLUCONATE 0.12 % MT SOLN
15.0000 mL | Freq: Once | OROMUCOSAL | Status: AC
  Administered 2020-12-06: 15 mL via OROMUCOSAL
  Filled 2020-12-06: qty 15

## 2020-12-06 SURGICAL SUPPLY — 34 items
ADH SKN CLS APL DERMABOND .7 (GAUZE/BANDAGES/DRESSINGS) ×1
ARMBAND PINK RESTRICT EXTREMIT (MISCELLANEOUS) ×2 IMPLANT
BAG COUNTER SPONGE SURGICOUNT (BAG) ×1 IMPLANT
BAG SPNG CNTER NS LX DISP (BAG)
CANISTER SUCT 3000ML PPV (MISCELLANEOUS) ×2 IMPLANT
CLIP LIGATING EXTRA MED SLVR (CLIP) ×2 IMPLANT
CLIP LIGATING EXTRA SM BLUE (MISCELLANEOUS) ×2 IMPLANT
CLIP VESOCCLUDE MED 6/CT (CLIP) ×1 IMPLANT
CLIP VESOCCLUDE SM WIDE 6/CT (CLIP) ×1 IMPLANT
COVER PROBE W GEL 5X96 (DRAPES) ×1 IMPLANT
DERMABOND ADVANCED (GAUZE/BANDAGES/DRESSINGS) ×1
DERMABOND ADVANCED .7 DNX12 (GAUZE/BANDAGES/DRESSINGS) ×1 IMPLANT
ELECT REM PT RETURN 9FT ADLT (ELECTROSURGICAL) ×2
ELECTRODE REM PT RTRN 9FT ADLT (ELECTROSURGICAL) ×1 IMPLANT
GAUZE 4X4 16PLY ~~LOC~~+RFID DBL (SPONGE) ×1 IMPLANT
GLOVE SURG ENC MOIS LTX SZ7.5 (GLOVE) ×2 IMPLANT
GOWN STRL REUS W/ TWL LRG LVL3 (GOWN DISPOSABLE) ×2 IMPLANT
GOWN STRL REUS W/ TWL XL LVL3 (GOWN DISPOSABLE) ×1 IMPLANT
GOWN STRL REUS W/TWL LRG LVL3 (GOWN DISPOSABLE) ×4
GOWN STRL REUS W/TWL XL LVL3 (GOWN DISPOSABLE) ×2
INSERT FOGARTY SM (MISCELLANEOUS) IMPLANT
KIT BASIN OR (CUSTOM PROCEDURE TRAY) ×2 IMPLANT
KIT TURNOVER KIT B (KITS) ×2 IMPLANT
NS IRRIG 1000ML POUR BTL (IV SOLUTION) ×2 IMPLANT
PACK CV ACCESS (CUSTOM PROCEDURE TRAY) ×2 IMPLANT
PAD ARMBOARD 7.5X6 YLW CONV (MISCELLANEOUS) ×4 IMPLANT
SPONGE T-LAP 18X18 ~~LOC~~+RFID (SPONGE) ×1 IMPLANT
SUT MNCRL AB 4-0 PS2 18 (SUTURE) ×2 IMPLANT
SUT PROLENE 6 0 BV (SUTURE) ×2 IMPLANT
SUT VIC AB 3-0 SH 27 (SUTURE) ×2
SUT VIC AB 3-0 SH 27X BRD (SUTURE) ×1 IMPLANT
TOWEL GREEN STERILE (TOWEL DISPOSABLE) ×2 IMPLANT
UNDERPAD 30X36 HEAVY ABSORB (UNDERPADS AND DIAPERS) ×2 IMPLANT
WATER STERILE IRR 1000ML POUR (IV SOLUTION) ×2 IMPLANT

## 2020-12-06 NOTE — Anesthesia Procedure Notes (Signed)
Procedure Name: LMA Insertion Date/Time: 12/06/2020 7:36 AM Performed by: Donnelly Angelica, RN Pre-anesthesia Checklist: Patient identified, Emergency Drugs available, Suction available and Patient being monitored Patient Re-evaluated:Patient Re-evaluated prior to induction Oxygen Delivery Method: Circle System Utilized Preoxygenation: Pre-oxygenation with 100% oxygen Induction Type: IV induction Ventilation: Mask ventilation without difficulty LMA: LMA inserted LMA Size: 4.0 and 4.5 Number of attempts: 1 Airway Equipment and Method: Bite block Placement Confirmation: positive ETCO2 Tube secured with: Tape Dental Injury: Teeth and Oropharynx as per pre-operative assessment

## 2020-12-06 NOTE — Anesthesia Postprocedure Evaluation (Signed)
Anesthesia Post Note  Patient: Ian Gregory.  Procedure(s) Performed: LEFT ARM BRACHIOCEPHALIC FISTULA CREATION (Left)     Patient location during evaluation: PACU Anesthesia Type: General Level of consciousness: awake and alert Pain management: pain level controlled Vital Signs Assessment: post-procedure vital signs reviewed and stable Respiratory status: spontaneous breathing, nonlabored ventilation and respiratory function stable Cardiovascular status: stable and blood pressure returned to baseline Postop Assessment: no apparent nausea or vomiting Anesthetic complications: no   No notable events documented.  Last Vitals:  Vitals:   12/06/20 0930 12/06/20 0943  BP: (!) 153/101 (!) 151/103  Pulse: 63 63  Resp:    Temp:  (!) 36.2 C  SpO2: 97% 94%    Last Pain:  Vitals:   12/06/20 0943  TempSrc:   PainSc: 8                  Candra R Shellie Goettl

## 2020-12-06 NOTE — Interval H&P Note (Signed)
History and Physical Interval Note:  12/06/2020 7:18 AM  Ian Gregory.  has presented today for surgery, with the diagnosis of ESRD.  The various methods of treatment have been discussed with the patient and family. After consideration of risks, benefits and other options for treatment, the patient has consented to  Procedure(s): LEFT ARM ARTERIOVENOUS (AV) FISTULA CREATION VERSUS GRAFT PLACEMENT (Left) as a surgical intervention.  The patient's history has been reviewed, patient examined, no change in status, stable for surgery.  I have reviewed the patient's chart and labs.  Questions were answered to the patient's satisfaction.     Servando Snare

## 2020-12-06 NOTE — Op Note (Signed)
    Patient name: Ian Gregory. MRN: [id number] DOB: 1965/12/24 Sex: male  12/06/2020 Pre-operative Diagnosis: ckd Post-operative diagnosis:  Same Surgeon:  Erlene Quan C. Donzetta Matters, MD Procedure Performed: Left brachial artery to basilic vein AV fistula creation  Indications: 55 year old male with history of chronic kidney disease.  He is now indicated for permanent access.  He is right-hand dominant.  He appears to have suitable basilic and marginal cephalic vein by preoperative imaging.  We have discussed the risk benefits alternatives and he agrees to proceed.  Findings: The cephalic vein in the upper arm was quite diminutive measuring less than 3 mm.  The basilic vein was actually quite large up until a branch point just above the antecubitum.  This was easily dilated to 3 and half millimeters.  At completion there was good flow through the fistula and a palpable radial artery pulse the wrist.  These were both confirmed with Doppler.   Procedure:  The patient was identified in the holding area and taken to the operating room where is placed supine on upper table and LMA anesthesia was induced.  He was sterilely prepped and draped in the left upper extremity usual fashion, antibiotics were ministered a timeout was called.  Ultrasound was used to identify what appeared to be a suitable basilic vein in the upper arm as well as the brachial artery which was quite large measuring approximately 5 mm and free of disease.  A transverse incision was made above the antecubitum.  We dissected down to the vein.  We divided branches between clips and ties.  We then dissected through the deep fascia of the brachial artery.  This was quite large.  We placed a vessel loop around this.  Veins was transected distally and tied off.  It was serially dilated to 3-1/2 mm flushed with heparinized saline marked for orientation and clamped.  The artery was then clamped distally proximally opened longitudinally we flushed  distally only with heparinized saline given the large size.  We then sewed the vein after speculating it and decide with 6-0 Prolene suture.  Prior completion without flushing all directions.  Upon completion there was good flow in the vein confirmed with Doppler.  We did free up some of the soft tissue around the vein to allow to seat nicely.  There was a strong radial artery signal which ultimately became palpable at the wrist.  We irrigated the wound and obtain hemostasis and closed in layers of Vicryl and Monocryl.  He was awakened from anesthesia having tolerated procedure without any complication.  All counts were correct at completion.  EBL: 20cc   Frederika Hukill C. Donzetta Matters, MD [facility] Office: [phone] Pager: [phone]

## 2020-12-06 NOTE — Transfer of Care (Signed)
Immediate Anesthesia Transfer of Care Note  Patient: Ian Gregory.  Procedure(s) Performed: LEFT ARM BRACHIOCEPHALIC FISTULA CREATION (Left)  Patient Location: PACU  Anesthesia Type:General  Level of Consciousness: drowsy  Airway & Oxygen Therapy: Patient Spontanous Breathing and Patient connected to face mask oxygen  Post-op Assessment: Report given to RN, Post -op Vital signs reviewed and stable and Patient moving all extremities X 4  Post vital signs: Reviewed and stable  Last Vitals:  Vitals Value Taken Time  BP 136/89 12/06/20 0843  Temp    Pulse 64 12/06/20 0843  Resp 12 12/06/20 0843  SpO2 100 % 12/06/20 0843  Vitals shown include unvalidated device data.  Last Pain:  Vitals:   12/06/20 0615  TempSrc:   PainSc: 0-No pain      Patients Stated Pain Goal: 8 (37/48/27 0786)  Complications: No notable events documented.

## 2020-12-07 ENCOUNTER — Encounter (HOSPITAL_COMMUNITY): Payer: Self-pay | Admitting: Vascular Surgery

## 2020-12-30 ENCOUNTER — Other Ambulatory Visit: Payer: Self-pay | Admitting: *Deleted

## 2020-12-30 DIAGNOSIS — N186 End stage renal disease: Secondary | ICD-10-CM

## 2021-01-13 ENCOUNTER — Other Ambulatory Visit: Payer: Self-pay

## 2021-01-13 ENCOUNTER — Ambulatory Visit (INDEPENDENT_AMBULATORY_CARE_PROVIDER_SITE_OTHER): Payer: Commercial Managed Care - PPO | Admitting: Physician Assistant

## 2021-01-13 ENCOUNTER — Ambulatory Visit (HOSPITAL_COMMUNITY)
Admission: RE | Admit: 2021-01-13 | Discharge: 2021-01-13 | Disposition: A | Discharge status: Home / Self Care | Payer: Commercial Managed Care - PPO | Source: Ambulatory Visit | Attending: Vascular Surgery | Admitting: Vascular Surgery

## 2021-01-13 VITALS — BP 199/124 | HR 85 | Temp 97.9°F | Resp 20 | Ht 75.0 in | Wt 266.1 lb

## 2021-01-13 DIAGNOSIS — N186 End stage renal disease: Secondary | ICD-10-CM | POA: Diagnosis present

## 2021-01-13 DIAGNOSIS — N184 Chronic kidney disease, stage 4 (severe): Secondary | ICD-10-CM

## 2021-01-13 NOTE — Progress Notes (Signed)
    Postoperative Access Visit   History of Present Illness   Ian Gregory. is a 55 y.o. year old male who presents for postoperative follow-up for: Left brachiobasilic AV fistula 01/13/04. The patient's wounds are well healed.  The patient notes no steal symptoms.  The patient is able to complete their activities of daily living.   He is not currently on hemodialysis. His Nephrologist is Dr. Carolin Sicks. He has his next follow up with him in September   Physical Examination   Vitals:   01/13/21 0852  BP: (!) 199/124  Pulse: 85  Resp: 20  Temp: 97.9 F (36.6 C)  TempSrc: Temporal  SpO2: 98%  Weight: 266 lb 1.6 oz (120.7 kg)  Height: 6\' 3"  (1.905 m)   Body mass index is 33.26 kg/m.  left arm Incision is well healed, 2+ radial pulse, hand grip is 5/5, sensation in digits is intact, palpable thrill, bruit can  be auscultated. Fistula is deep in left upper arm    Non invasive vascular lab: 01/13/21 Findings:  +--------------------+----------+-----------------+--------+  AVF                 PSV (cm/s)Flow Vol (mL/min)Comments  +--------------------+----------+-----------------+--------+  Native artery inflow   292          2325                 +--------------------+----------+-----------------+--------+  AVF Anastomosis        675                               +--------------------+----------+-----------------+--------+      +------------+----------+-------------+----------+--------+  OUTFLOW VEINPSV (cm/s)Diameter (cm)Depth (cm)Describe  +------------+----------+-------------+----------+--------+  Dist UA        800        0.33        1.00             +------------+----------+-------------+----------+--------+  Dist UA        512        0.30        1.05             +------------+----------+-------------+----------+--------+  AC Fossa       715        0.37        1.06              +------------+----------+-------------+----------+--------+   Medical Decision Making   Ian Gregory. is a 55 y.o. year old male who presents s/p Left brachiobasilic AV fistula 3/97/67. Incision is well healed. Patent is without signs or symptoms of steal syndrome. His duplex today shows adequate volume flow however fistula has not matured as much as we would like. He is not currently on dialysis so I have recommended that he  exercise the arm more and we will have him return in 4 weeks with repeat duplex. I also discussed with him that at some point he will likely need BV transposition but want to make sure that the fistula is going to mature first before scheduling this.  Karoline Caldwell, PA-C [facility] [place] Office: [phone]  Clinic MD: Donzetta Matters

## 2021-01-18 ENCOUNTER — Other Ambulatory Visit: Payer: Self-pay

## 2021-01-18 DIAGNOSIS — N184 Chronic kidney disease, stage 4 (severe): Secondary | ICD-10-CM

## 2021-02-15 ENCOUNTER — Other Ambulatory Visit: Payer: Self-pay

## 2021-02-15 ENCOUNTER — Ambulatory Visit (HOSPITAL_COMMUNITY)
Admission: RE | Admit: 2021-02-15 | Discharge: 2021-02-15 | Disposition: A | Discharge status: Home / Self Care | Payer: Commercial Managed Care - PPO | Source: Ambulatory Visit | Attending: Vascular Surgery | Admitting: Vascular Surgery

## 2021-02-15 ENCOUNTER — Ambulatory Visit (INDEPENDENT_AMBULATORY_CARE_PROVIDER_SITE_OTHER): Payer: Commercial Managed Care - PPO | Admitting: Physician Assistant

## 2021-02-15 VITALS — BP 155/94 | HR 62 | Temp 97.7°F | Resp 20 | Ht 75.0 in | Wt 269.6 lb

## 2021-02-15 DIAGNOSIS — N184 Chronic kidney disease, stage 4 (severe): Secondary | ICD-10-CM | POA: Insufficient documentation

## 2021-02-15 NOTE — Progress Notes (Signed)
Established Dialysis Access   History of Present Illness   Sahej Schrieber. is a 55 y.o. (02/10/1966) male who presents for re-evaluation of permanent access.  He is status post basilic vein fistula creation in his left arm on 12/06/2020 by Dr. Donzetta Matters.  He denies signs or symptoms of steal syndrome in his left hand.  Incision of left arm is healed.  He is not yet on hemodialysis.  He follows monthly with his nephrologist.  He is still making good urine.  He does not have any edema of bilateral lower extremities.  His nephrologist is Dr. Carolin Sicks.   Current Outpatient Medications  Medication Sig Dispense Refill   acetaminophen (TYLENOL) 325 MG tablet Take 2 tablets (650 mg total) by mouth every 6 (six) hours as needed for mild pain, fever or headache. 20 tablet 0   atorvastatin (LIPITOR) 10 MG tablet Take 10 mg by mouth daily.     carvedilol (COREG) 25 MG tablet Take 1 tablet (25 mg total) by mouth 2 (two) times daily with a meal. 60 tablet 1   Cholecalciferol (VITAMIN D3) 50 MCG (2000 UT) capsule Take 2,000 Units by mouth daily.     cloNIDine (CATAPRES) 0.3 MG tablet Take 1 tablet (0.3 mg total) by mouth every 8 (eight) hours. 60 tablet 11   fluticasone (FLONASE) 50 MCG/ACT nasal spray Place 1 spray into both nostrils daily as needed for allergies.     furosemide (LASIX) 20 MG tablet Take 3 tablets (60 mg total) by mouth 2 (two) times daily. (Patient taking differently: Take 80 mg by mouth 2 (two) times daily.) 60 tablet 0   hydrALAZINE (APRESOLINE) 50 MG tablet Take 50 mg by mouth 3 (three) times daily.     isosorbide mononitrate (IMDUR) 30 MG 24 hr tablet Take 30 mg by mouth daily.     isosorbide-hydrALAZINE (BIDIL) 20-37.5 MG tablet Take 2 tablets by mouth 2 (two) times daily. 60 tablet 0   metolazone (ZAROXOLYN) 2.5 MG tablet Take by mouth.     omeprazole (PRILOSEC) 40 MG capsule Take 40 mg by mouth daily.     ondansetron (ZOFRAN) 4 MG tablet Take 1 tablet (4 mg total) by mouth every 6  (six) hours as needed for nausea. 20 tablet 0   oxyCODONE-acetaminophen (PERCOCET) 5-325 MG tablet Take 1 tablet by mouth every 4 (four) hours as needed for severe pain. 20 tablet 0   No current facility-administered medications for this visit.    REVIEW OF SYSTEMS (negative unless checked):   Cardiac:  []  Chest pain or chest pressure? []  Shortness of breath upon activity? []  Shortness of breath when lying flat? []  Irregular heart rhythm?  Vascular:  []  Pain in calf, thigh, or hip brought on by walking? []  Pain in feet at night that wakes you up from your sleep? []  Blood clot in your veins? []  Leg swelling?  Pulmonary:  []  Oxygen at home? []  Productive cough? []  Wheezing?  Neurologic:  []  Sudden weakness in arms or legs? []  Sudden numbness in arms or legs? []  Sudden onset of difficult speaking or slurred speech? []  Temporary loss of vision in one eye? []  Problems with dizziness?  Gastrointestinal:  []  Blood in stool? []  Vomited blood?  Genitourinary:  []  Burning when urinating? []  Blood in urine?  Psychiatric:  []  Major depression  Hematologic:  []  Bleeding problems? []  Problems with blood clotting?  Dermatologic:  []  Rashes or ulcers?  Constitutional:  []  Fever or chills?  Ear/Nose/Throat:  []   Change in hearing? []  Nose bleeds? []  Sore throat?  Musculoskeletal:  []  Back pain? []  Joint pain? []  Muscle pain?   Physical Examination   Vitals:   02/15/21 1353  BP: (!) 155/94  Pulse: 62  Resp: 20  Temp: 97.7 F (36.5 C)  TempSrc: Temporal  SpO2: 98%  Weight: 269 lb 9.6 oz (122.3 kg)  Height: 6\' 3"  (1.905 m)   Body mass index is 33.7 kg/m.  General:  WDWN in NAD; vital signs documented above Gait: Not observed HENT: WNL, normocephalic Pulmonary: normal non-labored breathing , without Rales, rhonchi,  wheezing Cardiac: regular HR Abdomen: soft, NT, no masses Skin: without rashes Vascular Exam/Pulses:  Right Left  Radial 2+ (normal) 2+  (normal)   Extremities: without ischemic changes, without Gangrene , without cellulitis; without open wounds;  Musculoskeletal: no muscle wasting or atrophy  Neurologic: A&O X 3;  No focal weakness or paresthesias are detected Psychiatric:  The pt has Normal affect.   Non-invasive Vascular Imaging   Mature left basilic vein fistula greater than 6 mm in diameter    Medical Decision Making   Jhayden Demuro. is a 55 y.o. male who presents status post left arm basilic vein fistula creation.  Not yet on hemodialysis. Patent left arm basilic vein fistula without signs or symptoms of steal syndrome Based on physical exam and fistula duplex, fistula is ready for second stage transposition Plan will be left arm second stage basilic vein transposition in the near future with Dr. Donzetta Matters Risk, benefits, and alternatives to access surgery were discussed.   The patient is aware the risks include but are not limited to: bleeding, infection, steal syndrome, nerve damage, thrombosis, failure to mature, and need for additional procedures.   The patient agrees to proceed with the procedure.   Dagoberto Ligas PA-C [facility] [place] Office: [phone]  Clinic MD: Donzetta Matters

## 2021-02-15 NOTE — H&P (View-Only) (Signed)
Established Dialysis Access   History of Present Illness   Massimiliano Rohleder. is a 55 y.o. (11/11/1965) male who presents for re-evaluation of permanent access.  He is status post basilic vein fistula creation in his left arm on 12/06/2020 by Dr. Donzetta Matters.  He denies signs or symptoms of steal syndrome in his left hand.  Incision of left arm is healed.  He is not yet on hemodialysis.  He follows monthly with his nephrologist.  He is still making good urine.  He does not have any edema of bilateral lower extremities.  His nephrologist is Dr. Carolin Sicks.   Current Outpatient Medications  Medication Sig Dispense Refill   acetaminophen (TYLENOL) 325 MG tablet Take 2 tablets (650 mg total) by mouth every 6 (six) hours as needed for mild pain, fever or headache. 20 tablet 0   atorvastatin (LIPITOR) 10 MG tablet Take 10 mg by mouth daily.     carvedilol (COREG) 25 MG tablet Take 1 tablet (25 mg total) by mouth 2 (two) times daily with a meal. 60 tablet 1   Cholecalciferol (VITAMIN D3) 50 MCG (2000 UT) capsule Take 2,000 Units by mouth daily.     cloNIDine (CATAPRES) 0.3 MG tablet Take 1 tablet (0.3 mg total) by mouth every 8 (eight) hours. 60 tablet 11   fluticasone (FLONASE) 50 MCG/ACT nasal spray Place 1 spray into both nostrils daily as needed for allergies.     furosemide (LASIX) 20 MG tablet Take 3 tablets (60 mg total) by mouth 2 (two) times daily. (Patient taking differently: Take 80 mg by mouth 2 (two) times daily.) 60 tablet 0   hydrALAZINE (APRESOLINE) 50 MG tablet Take 50 mg by mouth 3 (three) times daily.     isosorbide mononitrate (IMDUR) 30 MG 24 hr tablet Take 30 mg by mouth daily.     isosorbide-hydrALAZINE (BIDIL) 20-37.5 MG tablet Take 2 tablets by mouth 2 (two) times daily. 60 tablet 0   metolazone (ZAROXOLYN) 2.5 MG tablet Take by mouth.     omeprazole (PRILOSEC) 40 MG capsule Take 40 mg by mouth daily.     ondansetron (ZOFRAN) 4 MG tablet Take 1 tablet (4 mg total) by mouth every 6  (six) hours as needed for nausea. 20 tablet 0   oxyCODONE-acetaminophen (PERCOCET) 5-325 MG tablet Take 1 tablet by mouth every 4 (four) hours as needed for severe pain. 20 tablet 0   No current facility-administered medications for this visit.    REVIEW OF SYSTEMS (negative unless checked):   Cardiac:  []  Chest pain or chest pressure? []  Shortness of breath upon activity? []  Shortness of breath when lying flat? []  Irregular heart rhythm?  Vascular:  []  Pain in calf, thigh, or hip brought on by walking? []  Pain in feet at night that wakes you up from your sleep? []  Blood clot in your veins? []  Leg swelling?  Pulmonary:  []  Oxygen at home? []  Productive cough? []  Wheezing?  Neurologic:  []  Sudden weakness in arms or legs? []  Sudden numbness in arms or legs? []  Sudden onset of difficult speaking or slurred speech? []  Temporary loss of vision in one eye? []  Problems with dizziness?  Gastrointestinal:  []  Blood in stool? []  Vomited blood?  Genitourinary:  []  Burning when urinating? []  Blood in urine?  Psychiatric:  []  Major depression  Hematologic:  []  Bleeding problems? []  Problems with blood clotting?  Dermatologic:  []  Rashes or ulcers?  Constitutional:  []  Fever or chills?  Ear/Nose/Throat:  []   Change in hearing? []  Nose bleeds? []  Sore throat?  Musculoskeletal:  []  Back pain? []  Joint pain? []  Muscle pain?   Physical Examination   Vitals:   02/15/21 1353  BP: (!) 155/94  Pulse: 62  Resp: 20  Temp: 97.7 F (36.5 C)  TempSrc: Temporal  SpO2: 98%  Weight: 269 lb 9.6 oz (122.3 kg)  Height: 6\' 3"  (1.905 m)   Body mass index is 33.7 kg/m.  General:  WDWN in NAD; vital signs documented above Gait: Not observed HENT: WNL, normocephalic Pulmonary: normal non-labored breathing , without Rales, rhonchi,  wheezing Cardiac: regular HR Abdomen: soft, NT, no masses Skin: without rashes Vascular Exam/Pulses:  Right Left  Radial 2+ (normal) 2+  (normal)   Extremities: without ischemic changes, without Gangrene , without cellulitis; without open wounds;  Musculoskeletal: no muscle wasting or atrophy  Neurologic: A&O X 3;  No focal weakness or paresthesias are detected Psychiatric:  The pt has Normal affect.   Non-invasive Vascular Imaging   Mature left basilic vein fistula greater than 6 mm in diameter    Medical Decision Making   Lawsen Arnott. is a 55 y.o. male who presents status post left arm basilic vein fistula creation.  Not yet on hemodialysis. Patent left arm basilic vein fistula without signs or symptoms of steal syndrome Based on physical exam and fistula duplex, fistula is ready for second stage transposition Plan will be left arm second stage basilic vein transposition in the near future with Dr. Donzetta Matters Risk, benefits, and alternatives to access surgery were discussed.   The patient is aware the risks include but are not limited to: bleeding, infection, steal syndrome, nerve damage, thrombosis, failure to mature, and need for additional procedures.   The patient agrees to proceed with the procedure.   Dagoberto Ligas PA-C [facility] [place] Office: [phone]  Clinic MD: Donzetta Matters

## 2021-02-16 ENCOUNTER — Other Ambulatory Visit: Payer: Self-pay

## 2021-03-02 ENCOUNTER — Encounter (HOSPITAL_COMMUNITY): Payer: Self-pay | Admitting: Vascular Surgery

## 2021-03-02 ENCOUNTER — Other Ambulatory Visit: Payer: Self-pay

## 2021-03-02 NOTE — Progress Notes (Signed)
PCP - Dr. Coletta Memos ([place]) Cardiologist - denies EKG - 10/05/20 Chest x-ray - 10/05/20 ECHO - 10/06/20 Cardiac Cath -  CPAP -   ERAS Protcol - n/a  COVID TEST- n/a  Anesthesia review: n/a  -------------  SDW INSTRUCTIONS:  Your procedure is scheduled on Friday 10/14. Please report to [facility] Main Entrance "A" at 0530 A.M., and check in at the Admitting office. Call this number if you have problems the morning of surgery: [phone]   Remember: Do not eat or drink after midnight the night before your surgery   Medications to take morning of surgery with a sip of water include: atorvastatin (LIPITOR) carvedilol (COREG)  cloNIDine (CATAPRES)  hydrALAZINE (APRESOLINE) isosorbide mononitrate (IMDUR) omeprazole (PRILOSEC)  If needed: acetaminophen (TYLENOL)  fluticasone (FLONASE) ondansetron (ZOFRAN) oxyCODONE-acetaminophen (PERCOCET)   As of today, STOP taking any Aspirin (unless otherwise instructed by your surgeon), Aleve, Naproxen, Ibuprofen, Motrin, Advil, Goody's, BC's, all herbal medications, fish oil, and all vitamins.    The Morning of Surgery Do not wear jewelry Do not wear lotions, powders, colognes, or deodorant Do not shave 48 hours prior to surgery.   Men may shave face and neck. Do not bring valuables to the hospital. [facility] is not responsible for any belongings or valuables.  If you are a smoker, DO NOT Smoke 24 hours prior to surgery  If you wear a CPAP at night please bring your mask the morning of surgery   Remember that you must have someone to transport you home after your surgery, and remain with you for 24 hours if you are discharged the same day.  Please bring cases for contacts, glasses, hearing aids, dentures or bridgework because it cannot be worn into surgery.   Patients discharged the day of surgery will not be allowed to drive home.   Please shower the NIGHT BEFORE/MORNING OF SURGERY (use antibacterial  soap like DIAL soap if possible). Wear comfortable clothes the morning of surgery. Oral Hygiene is also important to reduce your risk of infection.  Remember - BRUSH YOUR TEETH THE MORNING OF SURGERY WITH YOUR REGULAR TOOTHPASTE  Patient denies shortness of breath, fever, cough and chest pain.

## 2021-03-03 ENCOUNTER — Encounter (HOSPITAL_COMMUNITY): Payer: Self-pay | Admitting: Vascular Surgery

## 2021-03-03 ENCOUNTER — Ambulatory Visit (HOSPITAL_COMMUNITY): Payer: Commercial Managed Care - PPO | Admitting: Anesthesiology

## 2021-03-03 ENCOUNTER — Ambulatory Visit (HOSPITAL_COMMUNITY)
Admission: RE | Admit: 2021-03-03 | Discharge: 2021-03-03 | Disposition: A | Discharge status: Home / Self Care | Payer: Commercial Managed Care - PPO | Attending: Vascular Surgery | Admitting: Vascular Surgery

## 2021-03-03 ENCOUNTER — Other Ambulatory Visit: Payer: Self-pay | Admitting: Physician Assistant

## 2021-03-03 ENCOUNTER — Encounter (HOSPITAL_COMMUNITY)
Admission: RE | Disposition: A | Discharge status: Home / Self Care | Payer: Self-pay | Source: Home / Self Care | Attending: Vascular Surgery

## 2021-03-03 DIAGNOSIS — Z6832 Body mass index (BMI) 32.0-32.9, adult: Secondary | ICD-10-CM | POA: Diagnosis not present

## 2021-03-03 DIAGNOSIS — Z79899 Other long term (current) drug therapy: Secondary | ICD-10-CM | POA: Insufficient documentation

## 2021-03-03 DIAGNOSIS — I129 Hypertensive chronic kidney disease with stage 1 through stage 4 chronic kidney disease, or unspecified chronic kidney disease: Secondary | ICD-10-CM | POA: Insufficient documentation

## 2021-03-03 DIAGNOSIS — T82898A Other specified complication of vascular prosthetic devices, implants and grafts, initial encounter: Secondary | ICD-10-CM | POA: Diagnosis not present

## 2021-03-03 DIAGNOSIS — K219 Gastro-esophageal reflux disease without esophagitis: Secondary | ICD-10-CM | POA: Diagnosis not present

## 2021-03-03 DIAGNOSIS — E669 Obesity, unspecified: Secondary | ICD-10-CM | POA: Diagnosis not present

## 2021-03-03 DIAGNOSIS — N186 End stage renal disease: Secondary | ICD-10-CM | POA: Diagnosis not present

## 2021-03-03 DIAGNOSIS — N183 Chronic kidney disease, stage 3 unspecified: Secondary | ICD-10-CM | POA: Diagnosis not present

## 2021-03-03 DIAGNOSIS — D631 Anemia in chronic kidney disease: Secondary | ICD-10-CM | POA: Insufficient documentation

## 2021-03-03 HISTORY — PX: BASCILIC VEIN TRANSPOSITION: SHX5742

## 2021-03-03 LAB — POCT I-STAT, CHEM 8
BUN: 66 mg/dL — ABNORMAL HIGH (ref 6–20)
Calcium, Ion: 1.03 mmol/L — ABNORMAL LOW (ref 1.15–1.40)
Chloride: 107 mmol/L (ref 98–111)
Creatinine, Ser: 7.4 mg/dL — ABNORMAL HIGH (ref 0.61–1.24)
Glucose, Bld: 114 mg/dL — ABNORMAL HIGH (ref 70–99)
HCT: 35 % — ABNORMAL LOW (ref 39.0–52.0)
Hemoglobin: 11.9 g/dL — ABNORMAL LOW (ref 13.0–17.0)
Potassium: 3.5 mmol/L (ref 3.5–5.1)
Sodium: 140 mmol/L (ref 135–145)
TCO2: 22 mmol/L (ref 22–32)

## 2021-03-03 SURGERY — TRANSPOSITION, VEIN, BASILIC
Anesthesia: General | Site: Arm Upper | Laterality: Left

## 2021-03-03 MED ORDER — PROPOFOL 10 MG/ML IV BOLUS
INTRAVENOUS | Status: DC | PRN
  Administered 2021-03-03: 200 mg via INTRAVENOUS

## 2021-03-03 MED ORDER — OXYCODONE HCL 5 MG PO TABS
ORAL_TABLET | ORAL | Status: AC
  Filled 2021-03-03: qty 1

## 2021-03-03 MED ORDER — 0.9 % SODIUM CHLORIDE (POUR BTL) OPTIME
TOPICAL | Status: DC | PRN
  Administered 2021-03-03: 1000 mL

## 2021-03-03 MED ORDER — HEPARIN 6000 UNIT IRRIGATION SOLUTION
Status: AC
  Filled 2021-03-03: qty 500

## 2021-03-03 MED ORDER — CEFAZOLIN SODIUM-DEXTROSE 2-4 GM/100ML-% IV SOLN
2.0000 g | INTRAVENOUS | Status: AC
  Administered 2021-03-03: 2 g via INTRAVENOUS
  Filled 2021-03-03: qty 100

## 2021-03-03 MED ORDER — FENTANYL CITRATE (PF) 250 MCG/5ML IJ SOLN
INTRAMUSCULAR | Status: AC
  Filled 2021-03-03: qty 5

## 2021-03-03 MED ORDER — ONDANSETRON HCL 4 MG/2ML IJ SOLN
INTRAMUSCULAR | Status: DC | PRN
  Administered 2021-03-03: 4 mg via INTRAVENOUS

## 2021-03-03 MED ORDER — CHLORHEXIDINE GLUCONATE 4 % EX LIQD: 60.0000 mL | Freq: Once | CUTANEOUS | Status: DC

## 2021-03-03 MED ORDER — OXYCODONE HCL 5 MG/5ML PO SOLN: 5.0000 mg | Freq: Once | ORAL | Status: AC | PRN

## 2021-03-03 MED ORDER — PHENYLEPHRINE 40 MCG/ML (10ML) SYRINGE FOR IV PUSH (FOR BLOOD PRESSURE SUPPORT)
PREFILLED_SYRINGE | INTRAVENOUS | Status: DC | PRN
Start: 2021-03-03 — End: 2021-03-03
  Administered 2021-03-03: 120 ug via INTRAVENOUS
  Administered 2021-03-03 (×2): 80 ug via INTRAVENOUS
  Administered 2021-03-03: 120 ug via INTRAVENOUS

## 2021-03-03 MED ORDER — HYDROMORPHONE HCL 1 MG/ML IJ SOLN
INTRAMUSCULAR | Status: AC
  Filled 2021-03-03: qty 1

## 2021-03-03 MED ORDER — DEXAMETHASONE SODIUM PHOSPHATE 10 MG/ML IJ SOLN
INTRAMUSCULAR | Status: AC
  Filled 2021-03-03: qty 1

## 2021-03-03 MED ORDER — CHLORHEXIDINE GLUCONATE 0.12 % MT SOLN
15.0000 mL | Freq: Once | OROMUCOSAL | Status: AC
  Administered 2021-03-03: 15 mL via OROMUCOSAL
  Filled 2021-03-03: qty 15

## 2021-03-03 MED ORDER — PROTAMINE SULFATE 10 MG/ML IV SOLN
INTRAVENOUS | Status: AC
  Filled 2021-03-03: qty 5

## 2021-03-03 MED ORDER — MIDAZOLAM HCL 2 MG/2ML IJ SOLN
INTRAMUSCULAR | Status: AC
  Filled 2021-03-03: qty 2

## 2021-03-03 MED ORDER — OXYCODONE-ACETAMINOPHEN 10-325 MG PO TABS: 1.0000 | ORAL_TABLET | Freq: Four times a day (QID) | ORAL | 0 refills | Status: DC | PRN

## 2021-03-03 MED ORDER — MIDAZOLAM HCL 5 MG/5ML IJ SOLN
INTRAMUSCULAR | Status: DC | PRN
  Administered 2021-03-03: 2 mg via INTRAVENOUS

## 2021-03-03 MED ORDER — PHENYLEPHRINE 40 MCG/ML (10ML) SYRINGE FOR IV PUSH (FOR BLOOD PRESSURE SUPPORT)
PREFILLED_SYRINGE | INTRAVENOUS | Status: AC
  Filled 2021-03-03: qty 10

## 2021-03-03 MED ORDER — MEPERIDINE HCL 25 MG/ML IJ SOLN: 6.2500 mg | INTRAMUSCULAR | Status: DC | PRN

## 2021-03-03 MED ORDER — HEPARIN SODIUM (PORCINE) 1000 UNIT/ML IJ SOLN
INTRAMUSCULAR | Status: AC
  Filled 2021-03-03: qty 1

## 2021-03-03 MED ORDER — PROMETHAZINE HCL 25 MG/ML IJ SOLN: 6.2500 mg | INTRAMUSCULAR | Status: DC | PRN

## 2021-03-03 MED ORDER — SODIUM CHLORIDE 0.9 % IV SOLN: INTRAVENOUS | Status: DC

## 2021-03-03 MED ORDER — ORAL CARE MOUTH RINSE: 15.0000 mL | Freq: Once | OROMUCOSAL | Status: AC

## 2021-03-03 MED ORDER — PHENYLEPHRINE HCL-NACL 20-0.9 MG/250ML-% IV SOLN
INTRAVENOUS | Status: DC | PRN
  Administered 2021-03-03: 25 ug/min via INTRAVENOUS

## 2021-03-03 MED ORDER — PROPOFOL 10 MG/ML IV BOLUS
INTRAVENOUS | Status: AC
  Filled 2021-03-03: qty 40

## 2021-03-03 MED ORDER — FENTANYL CITRATE (PF) 250 MCG/5ML IJ SOLN
INTRAMUSCULAR | Status: DC | PRN
  Administered 2021-03-03 (×2): 50 ug via INTRAVENOUS

## 2021-03-03 MED ORDER — HEPARIN 6000 UNIT IRRIGATION SOLUTION
Status: DC | PRN
  Administered 2021-03-03: 1

## 2021-03-03 MED ORDER — ONDANSETRON HCL 4 MG/2ML IJ SOLN
INTRAMUSCULAR | Status: AC
  Filled 2021-03-03: qty 2

## 2021-03-03 MED ORDER — OXYCODONE HCL 5 MG PO TABS
5.0000 mg | ORAL_TABLET | Freq: Once | ORAL | Status: AC | PRN
  Administered 2021-03-03: 5 mg via ORAL

## 2021-03-03 MED ORDER — LIDOCAINE 2% (20 MG/ML) 5 ML SYRINGE
INTRAMUSCULAR | Status: AC
  Filled 2021-03-03: qty 5

## 2021-03-03 MED ORDER — LIDOCAINE 2% (20 MG/ML) 5 ML SYRINGE
INTRAMUSCULAR | Status: DC | PRN
  Administered 2021-03-03: 60 mg via INTRAVENOUS

## 2021-03-03 MED ORDER — HYDROMORPHONE HCL 1 MG/ML IJ SOLN: 0.2500 mg | INTRAMUSCULAR | Status: DC | PRN

## 2021-03-03 MED ORDER — HYDROMORPHONE HCL 1 MG/ML IJ SOLN
0.2500 mg | INTRAMUSCULAR | Status: DC | PRN
  Administered 2021-03-03: 0.25 mg via INTRAVENOUS
  Administered 2021-03-03: 0.5 mg via INTRAVENOUS
  Administered 2021-03-03: 0.25 mg via INTRAVENOUS
  Administered 2021-03-03 (×2): 0.5 mg via INTRAVENOUS

## 2021-03-03 MED ORDER — DEXAMETHASONE SODIUM PHOSPHATE 10 MG/ML IJ SOLN
INTRAMUSCULAR | Status: DC | PRN
  Administered 2021-03-03: 10 mg via INTRAVENOUS

## 2021-03-03 MED ORDER — OXYCODONE-ACETAMINOPHEN 5-325 MG PO TABS: 1.0000 | ORAL_TABLET | Freq: Four times a day (QID) | ORAL | 0 refills | Status: AC | PRN

## 2021-03-03 SURGICAL SUPPLY — 39 items
ADH SKN CLS APL DERMABOND .7 (GAUZE/BANDAGES/DRESSINGS) ×2
ARMBAND PINK RESTRICT EXTREMIT (MISCELLANEOUS) ×2 IMPLANT
BAG COUNTER SPONGE SURGICOUNT (BAG) ×2 IMPLANT
BAG SPNG CNTER NS LX DISP (BAG) ×1
CANISTER SUCT 3000ML PPV (MISCELLANEOUS) ×2 IMPLANT
CLIP LIGATING EXTRA MED SLVR (CLIP) ×2 IMPLANT
CLIP LIGATING EXTRA SM BLUE (MISCELLANEOUS) ×2 IMPLANT
CLIP VESOCCLUDE MED 6/CT (CLIP) IMPLANT
CLIP VESOCCLUDE SM WIDE 24/CT (CLIP) IMPLANT
CLIP VESOCCLUDE SM WIDE 6/CT (CLIP) IMPLANT
COVER PROBE W GEL 5X96 (DRAPES) ×2 IMPLANT
DERMABOND ADVANCED (GAUZE/BANDAGES/DRESSINGS) ×2
DERMABOND ADVANCED .7 DNX12 (GAUZE/BANDAGES/DRESSINGS) ×1 IMPLANT
ELECT REM PT RETURN 9FT ADLT (ELECTROSURGICAL) ×2
ELECTRODE REM PT RTRN 9FT ADLT (ELECTROSURGICAL) ×1 IMPLANT
GLOVE SURG ENC MOIS LTX SZ7.5 (GLOVE) ×2 IMPLANT
GLOVE SURG MICRO LTX SZ6 (GLOVE) ×1 IMPLANT
GLOVE SURG MICRO LTX SZ6.5 (GLOVE) ×1 IMPLANT
GLOVE SURG POLYISO LF SZ7.5 (GLOVE) ×1 IMPLANT
GOWN STRL REUS W/ TWL LRG LVL3 (GOWN DISPOSABLE) ×2 IMPLANT
GOWN STRL REUS W/ TWL XL LVL3 (GOWN DISPOSABLE) ×1 IMPLANT
GOWN STRL REUS W/TWL LRG LVL3 (GOWN DISPOSABLE) ×6
GOWN STRL REUS W/TWL XL LVL3 (GOWN DISPOSABLE) ×2
KIT BASIN OR (CUSTOM PROCEDURE TRAY) ×2 IMPLANT
KIT TURNOVER KIT B (KITS) ×2 IMPLANT
NS IRRIG 1000ML POUR BTL (IV SOLUTION) ×2 IMPLANT
PACK CV ACCESS (CUSTOM PROCEDURE TRAY) ×2 IMPLANT
PAD ARMBOARD 7.5X6 YLW CONV (MISCELLANEOUS) ×4 IMPLANT
SUT MNCRL AB 4-0 PS2 18 (SUTURE) ×3 IMPLANT
SUT PROLENE 5 0 C 1 24 (SUTURE) ×2 IMPLANT
SUT PROLENE 6 0 BV (SUTURE) ×4 IMPLANT
SUT SILK 2 0 SH (SUTURE) IMPLANT
SUT SILK 3 0 (SUTURE) ×2
SUT SILK 3-0 18XBRD TIE 12 (SUTURE) IMPLANT
SUT VIC AB 3-0 SH 27 (SUTURE) ×6
SUT VIC AB 3-0 SH 27X BRD (SUTURE) ×1 IMPLANT
TOWEL GREEN STERILE (TOWEL DISPOSABLE) ×2 IMPLANT
UNDERPAD 30X36 HEAVY ABSORB (UNDERPADS AND DIAPERS) ×2 IMPLANT
WATER STERILE IRR 1000ML POUR (IV SOLUTION) ×2 IMPLANT

## 2021-03-03 NOTE — Addendum Note (Signed)
Addendum  created 03/03/21 1212 by Jenne Campus, CRNA   Intraprocedure Meds edited

## 2021-03-03 NOTE — Anesthesia Postprocedure Evaluation (Signed)
Anesthesia Post Note  Patient: Ian Gregory.  Procedure(s) Performed: LEFT SECOND STAGE BASILIC VEIN TRANSPOSITION (Left: Arm Upper)     Patient location during evaluation: PACU Anesthesia Type: General Level of consciousness: awake and alert, oriented and patient cooperative Pain management: pain level controlled Vital Signs Assessment: post-procedure vital signs reviewed and stable Respiratory status: spontaneous breathing, nonlabored ventilation and respiratory function stable Cardiovascular status: blood pressure returned to baseline and stable Postop Assessment: no apparent nausea or vomiting Anesthetic complications: no   No notable events documented.  Last Vitals:  Vitals:   03/03/21 1030 03/03/21 1045  BP: (!) 150/98 (!) 149/102  Pulse: 69 68  Resp: 20 20  Temp:    SpO2: 94% 95%    Last Pain:  Vitals:   03/03/21 1045  TempSrc:   PainSc: [place]

## 2021-03-03 NOTE — Anesthesia Preprocedure Evaluation (Addendum)
Anesthesia Evaluation  Patient identified by MRN, date of birth, ID band Patient awake    Reviewed: Allergy & Precautions, NPO status , Patient's Chart, lab work & pertinent test results, reviewed documented beta blocker date and time   Airway Mallampati: II  TM Distance: >3 FB Neck ROM: Full    Dental  (+) Dental Advisory Given, Poor Dentition, Missing, Chipped,    Pulmonary  sarcoid   Pulmonary exam normal breath sounds clear to auscultation       Cardiovascular hypertension, Pt. on medications and Pt. 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  negative genitourinary   Musculoskeletal  (+) Arthritis , Osteoarthritis,    Abdominal (+) + obese,   Peds negative pediatric ROS (+)  Hematology  (+) Blood dyscrasia, anemia , REFUSES BLOOD PRODUCTS, hct 35   Anesthesia Other Findings   Reproductive/Obstetrics negative OB ROS                           Anesthesia Physical Anesthesia Plan  ASA: 3  Anesthesia Plan: General   Post-op Pain Management:    Induction: Intravenous  PONV Risk Score and Plan: 2 and Ondansetron, Dexamethasone, Midazolam and Treatment may vary due to age or medical condition  Airway Management Planned: LMA  Additional Equipment: None  Intra-op Plan:   Post-operative Plan: Extubation in OR  Informed Consent: I have reviewed the patients History and Physical, chart, labs and discussed the procedure including the risks, benefits and alternatives for the proposed anesthesia with the patient or authorized representative who has indicated his/her understanding and acceptance.     Dental advisory given  Plan Discussed with: CRNA  Anesthesia Plan  Comments:         Anesthesia Quick Evaluation

## 2021-03-03 NOTE — Interval H&P Note (Signed)
History and Physical Interval Note:  03/03/2021 7:21 AM  Ian Gregory.  has presented today for surgery, with the diagnosis of CKD IV.  The various methods of treatment have been discussed with the patient and family. After consideration of risks, benefits and other options for treatment, the patient has consented to  Procedure(s): LEFT SECOND STAGE BASILIC VEIN TRANSPOSITION (Left) as a surgical intervention.  The patient's history has been reviewed, patient examined, no change in status, stable for surgery.  I have reviewed the patient's chart and labs.  Questions were answered to the patient's satisfaction.     Servando Snare

## 2021-03-03 NOTE — Discharge Instructions (Signed)
° °  [facility] ° °Discharge Instructions ° °AV Fistula or Graft Surgery for Dialysis Access ° °Please refer to the following instructions for your post-procedure care. Your surgeon or physician assistant will discuss any changes with you. ° °Activity ° °You may drive the day following your surgery, if you are comfortable and no longer taking prescription pain medication. Resume full activity as the soreness in your incision resolves. ° °Bathing/Showering ° °You may shower after you go home. Keep your incision dry for 48 hours. Do not soak in a bathtub, hot tub, or swim until the incision heals completely. You may not shower if you have a hemodialysis catheter. ° °Incision Care ° °Clean your incision with mild soap and water after 48 hours. Pat the area dry with a clean towel. You do not need a bandage unless otherwise instructed. Do not apply any ointments or creams to your incision. You may have skin glue on your incision. Do not peel it off. It will come off on its own in about one week. Your arm may swell a bit after surgery. To reduce swelling use pillows to elevate your arm so it is above your heart. Your doctor will tell you if you need to lightly wrap your arm with an ACE bandage. ° °Diet ° °Resume your normal diet. There are not special food restrictions following this procedure. In order to heal from your surgery, it is CRITICAL to get adequate nutrition. Your body requires vitamins, minerals, and protein. Vegetables are the best source of vitamins and minerals. Vegetables also provide the perfect balance of protein. Processed food has little nutritional value, so try to avoid this. ° °Medications ° °Resume taking all of your medications. If your incision is causing pain, you may take over-the counter pain relievers such as acetaminophen (Tylenol). If you were prescribed a stronger pain medication, please be aware these medications can cause nausea and constipation. Prevent  nausea by taking the medication with a snack or meal. Avoid constipation by drinking plenty of fluids and eating foods with high amount of fiber, such as fruits, vegetables, and grains. Do not take Tylenol if you are taking prescription pain medications. ° ° ° ° °Follow up °Your surgeon may want to see you in the office following your access surgery. If so, this will be arranged at the time of your surgery. ° °Please call us immediately for any of the following conditions: ° °Increased pain, redness, drainage (pus) from your incision site °Fever of 101 degrees or higher °Severe or worsening pain at your incision site °Hand pain or numbness. ° °Reduce your risk of vascular disease: ° °Stop smoking. If you would like help, call [facility] at 1-800-QUIT-NOW (1-800-784-8669) or [place] at 336-586-4000 ° °Manage your cholesterol °Maintain a desired weight °Control your diabetes °Keep your blood pressure down ° °Dialysis ° °It will take several weeks to several months for your new dialysis access to be ready for use. Your surgeon will determine when it is OK to use it. Your nephrologist will continue to direct your dialysis. You can continue to use your Permcath until your new access is ready for use. ° °If you have any questions, please call the office at [phone]. ° °

## 2021-03-03 NOTE — Op Note (Signed)
    Patient name: Willliam Pettet. MRN: [id number] DOB: 1965/07/03 Sex: male  03/03/2021 Pre-operative Diagnosis: chronic kidney disease Post-operative diagnosis:  Same Surgeon:  Eda Paschal. Donzetta Matters, MD Procedure Performed: Revision of left arm basilic vein fistula with transposition  Indications: 55 year old male with chronic kidney disease.  He has a previously placed first stage basilic vein fistula.  He is now indicated for revision with transposition.  Findings: There was dense scar tissue around the anastomosis.  The fistula itself branched into another vein in the mid arm that went into the deep system and there were multiple communications that were divided.  After doing this we did have approximately a 7 mm vein throughout its course this was tunneled laterally and sewn end-to-end to the anastomosis and at completion there was a very strong thrill and a palpable radial artery pulse the wrist.  Procedure:  The patient was identified in the holding area and taken to the operating was placed supine on upper table and LMA anesthesia induced.  He was gently prepped draped in the left upper extremity usual fashion, antibiotics were ministered and a timeout was called.  We began by opening his previous incision and above the antecubitum.  We dissected down there was dense scar tissue there.  We made a second incision in the mid arm and dissected down to the fistula.  We were able to dissected back to the anastomosis through this incision.  The nerve was protected throughout its course.  We mobilized all way back to the arteriovenous anastomosis.  Branches were divided between clips and ties.  We made a third incision near the axilla.  There were multiple communications to a deeper vein that were divided between ties.  Ultimately we had isolated the entirety of the basilic vein fistula.  We marked for orientation.  We clamped to the antecubitum and transected.  I then clamped near the axilla flushed with  heparinized saline there were no leaking areas.  It was reclamped and tunneled laterally.  Both ends were spatulated and sewn end-to-end with 6-0 Prolene suture.  Prior completion without flushing all directions.  Upon completion there was a very strong thrill in the fistula and a palpable radial artery pulse the wrist.  We irrigated the wounds we obtain hemostasis we closed in layers of Vicryl and Monocryl.  Dermabond was placed at the skin level.  He was awakened from anesthesia having tolerated procedure without any complication all counts were correct at completion  EBL: 50cc   Willis Holquin C. Donzetta Matters, MD [facility] Office: [phone] Pager: [phone]

## 2021-03-03 NOTE — Transfer of Care (Signed)
Immediate Anesthesia Transfer of Care Note  Patient: Ian Gregory.  Procedure(s) Performed: LEFT SECOND STAGE BASILIC VEIN TRANSPOSITION (Left: Arm Upper)  Patient Location: PACU  Anesthesia Type:General  Level of Consciousness: awake, oriented and patient cooperative  Airway & Oxygen Therapy: Patient Spontanous Breathing and Patient connected to nasal cannula oxygen  Post-op Assessment: Report given to RN and Post -op Vital signs reviewed and stable  Post vital signs: Reviewed  Last Vitals:  Vitals Value Taken Time  BP 161/110 03/03/21 1001  Temp 36.4 C 03/03/21 1000  Pulse 71 03/03/21 1003  Resp 16 03/03/21 1003  SpO2 99 % 03/03/21 1003  Vitals shown include unvalidated device data.  Last Pain:  Vitals:   03/03/21 0627  TempSrc:   PainSc: 0-No pain         Complications: No notable events documented.

## 2021-03-03 NOTE — Anesthesia Procedure Notes (Addendum)
Procedure Name: LMA Insertion Date/Time: 03/03/2021 7:50 AM Performed by: Jenne Campus, CRNA Pre-anesthesia Checklist: Patient identified, Emergency Drugs available, Suction available and Patient being monitored Patient Re-evaluated:Patient Re-evaluated prior to induction Oxygen Delivery Method: Circle System Utilized Preoxygenation: Pre-oxygenation with 100% oxygen Induction Type: IV induction Ventilation: Mask ventilation without difficulty LMA: LMA inserted LMA Size: 5.0 Number of attempts: 1 Airway Equipment and Method: Bite block Placement Confirmation: positive ETCO2 and breath sounds checked- equal and bilateral Tube secured with: Tape Dental Injury: Teeth and Oropharynx as per pre-operative assessment

## 2021-03-04 ENCOUNTER — Emergency Department (HOSPITAL_COMMUNITY): Payer: Commercial Managed Care - PPO

## 2021-03-04 ENCOUNTER — Encounter (HOSPITAL_COMMUNITY): Payer: Self-pay | Admitting: Vascular Surgery

## 2021-03-04 ENCOUNTER — Emergency Department (HOSPITAL_COMMUNITY)
Admission: EM | Admit: 2021-03-04 | Discharge: 2021-03-04 | Disposition: A | Discharge status: Home / Self Care | Payer: Commercial Managed Care - PPO | Attending: Emergency Medicine | Admitting: Emergency Medicine

## 2021-03-04 DIAGNOSIS — I130 Hypertensive heart and chronic kidney disease with heart failure and stage 1 through stage 4 chronic kidney disease, or unspecified chronic kidney disease: Secondary | ICD-10-CM | POA: Diagnosis not present

## 2021-03-04 DIAGNOSIS — Z79899 Other long term (current) drug therapy: Secondary | ICD-10-CM | POA: Diagnosis not present

## 2021-03-04 DIAGNOSIS — Y838 Other surgical procedures as the cause of abnormal reaction of the patient, or of later complication, without mention of misadventure at the time of the procedure: Secondary | ICD-10-CM | POA: Diagnosis not present

## 2021-03-04 DIAGNOSIS — I5043 Acute on chronic combined systolic (congestive) and diastolic (congestive) heart failure: Secondary | ICD-10-CM | POA: Diagnosis not present

## 2021-03-04 DIAGNOSIS — T8131XA Disruption of external operation (surgical) wound, not elsewhere classified, initial encounter: Secondary | ICD-10-CM | POA: Insufficient documentation

## 2021-03-04 DIAGNOSIS — N184 Chronic kidney disease, stage 4 (severe): Secondary | ICD-10-CM | POA: Diagnosis not present

## 2021-03-04 DIAGNOSIS — T8130XA Disruption of wound, unspecified, initial encounter: Secondary | ICD-10-CM

## 2021-03-04 DIAGNOSIS — Z8616 Personal history of COVID-19: Secondary | ICD-10-CM | POA: Insufficient documentation

## 2021-03-04 LAB — COMPREHENSIVE METABOLIC PANEL
ALT: 15 U/L (ref 0–44)
AST: 24 U/L (ref 15–41)
Albumin: 3.5 g/dL (ref 3.5–5.0)
Alkaline Phosphatase: 62 U/L (ref 38–126)
Anion gap: 16 — ABNORMAL HIGH (ref 5–15)
BUN: 74 mg/dL — ABNORMAL HIGH (ref 6–20)
CO2: 19 mmol/L — ABNORMAL LOW (ref 22–32)
Calcium: 8.8 mg/dL — ABNORMAL LOW (ref 8.9–10.3)
Chloride: 102 mmol/L (ref 98–111)
Creatinine, Ser: 7.27 mg/dL — ABNORMAL HIGH (ref 0.61–1.24)
GFR, Estimated: 8 mL/min — ABNORMAL LOW (ref >60)
Glucose, Bld: 172 mg/dL — ABNORMAL HIGH (ref 70–99)
Potassium: 3.5 mmol/L (ref 3.5–5.1)
Sodium: 137 mmol/L (ref 135–145)
Total Bilirubin: 0.7 mg/dL (ref 0.3–1.2)
Total Protein: 7.1 g/dL (ref 6.5–8.1)

## 2021-03-04 LAB — CBC WITH DIFFERENTIAL/PLATELET
Abs Immature Granulocytes: 0.06 10*3/uL (ref 0.00–0.07)
Basophils Absolute: 0 10*3/uL (ref 0.0–0.1)
Basophils Relative: 0 %
Eosinophils Absolute: 0 10*3/uL (ref 0.0–0.5)
Eosinophils Relative: 0 %
HCT: 35.5 % — ABNORMAL LOW (ref 39.0–52.0)
Hemoglobin: 11.2 g/dL — ABNORMAL LOW (ref 13.0–17.0)
Immature Granulocytes: 1 %
Lymphocytes Relative: 7 %
Lymphs Abs: 0.8 10*3/uL (ref 0.7–4.0)
MCH: 29.6 pg (ref 26.0–34.0)
MCHC: 31.5 g/dL (ref 30.0–36.0)
MCV: 93.7 fL (ref 80.0–100.0)
Monocytes Absolute: 0.7 10*3/uL (ref 0.1–1.0)
Monocytes Relative: 6 %
Neutro Abs: 9.6 10*3/uL — ABNORMAL HIGH (ref 1.7–7.7)
Neutrophils Relative %: 86 %
Platelets: 156 10*3/uL (ref 150–400)
RBC: 3.79 MIL/uL — ABNORMAL LOW (ref 4.22–5.81)
RDW: 13.2 % (ref 11.5–15.5)
WBC: 11.1 10*3/uL — ABNORMAL HIGH (ref 4.0–10.5)
nRBC: 0 % (ref 0.0–0.2)

## 2021-03-04 LAB — PROTIME-INR
INR: 1 (ref 0.8–1.2)
Prothrombin Time: 13.6 seconds (ref 11.4–15.2)

## 2021-03-04 LAB — LACTIC ACID, PLASMA: Lactic Acid, Venous: 2 mmol/L (ref 0.5–1.9)

## 2021-03-04 NOTE — ED Provider Notes (Signed)
Emergency Medicine Provider Triage Evaluation Note  [name]. , a 55 y.o. male  was evaluated in triage.  Pt complains of open wound.  Patient had a fistula placed in the left arm yesterday, this morning he noticed the wound was open.  He has pain in that site, denies any fever or purulent drainage.  No chest pain, headache, patient took his blood pressure medicine this morning..  Review of Systems  Positive: Open wound Negative: Fever, headache, chest pain  Physical Exam  BP (!) 216/116 (BP Location: Right Arm)   Pulse (!) 102   Temp 99.2 F (37.3 C) (Oral)   Resp 16   SpO2 97%  Gen:   Awake, no distress   Resp:  Normal effort  MSK:   Moves extremities without difficulty  Other:  Open wound to the left arm, no surrounding fluctuance.  Not actively bleeding.  Medical Decision Making  Medically screening exam initiated at 11:17 AM.  Appropriate orders placed.  [name]. was informed that the remainder of the evaluation will be completed by another provider, this initial triage assessment does not replace that evaluation, and the importance of remaining in the ED until their evaluation is complete.  Patient is mildly tachycardic with an elevated temperature, suspicion for sepsis.   Sherrill Raring, PA-C 03/04/21 1118    Davonna Belling, MD 03/04/21 2044

## 2021-03-04 NOTE — ED Provider Notes (Signed)
[facility] EMERGENCY DEPARTMENT Provider Note   CSN: [id number] Arrival date & time: 03/04/21  1053     History No chief complaint on file.   Ian Gregory. is a 55 y.o. male presenting with left upper extremity wound after vascular surgery procedure.  PMH significant for stage IV CKD, HTN, sarcoidosis, CHF.  Patient had a surgical procedure with vascular surgery yesterday. He noticed last night that there was an open wound. He has not had any fevers, not noticed any increased pain or erythema as he is current on medications.     Past Medical History:  Diagnosis Date   Anemia    Arthritis    CHF (congestive heart failure) (HCC)    CKD (chronic kidney disease) stage 3, GFR 30-59 ml/min (HCC)    Stage 4   GERD (gastroesophageal reflux disease)    HTN (hypertension)    Pneumonia    Sarcoidosis     Patient Active Problem List   Diagnosis Date Noted   ARF (acute renal failure) ([place]) 10/05/2020   Pneumonia due to COVID-19 virus 06/02/2019   Mild peripheral edema 05/28/2017   SOB (shortness of breath)    Acute on chronic systolic CHF (congestive heart failure) (HCC)    Acute on chronic diastolic CHF (congestive heart failure) ([place])    CAP (community acquired pneumonia) 05/05/2017   Hypertensive crisis 05/05/2017   CKD (chronic kidney disease) stage 3, GFR 30-59 ml/min (HCC) 05/05/2017   Acute CHF (congestive heart failure) ([place]) 05/05/2017   Hypertensive crisis without congestive heart failure    Chest cold    Hypertensive emergency    Thrombocytopenia ([place]) 05/13/2015   Accelerated hypertension 05/12/2015   Acute renal failure superimposed on stage 3 chronic kidney disease ([place]) 05/12/2015   Anemia of chronic renal failure, stage 3 (moderate) ([place]) 05/12/2015   Acute respiratory failure with hypoxemia ([place]) 05/12/2015   SIRS (systemic inflammatory response syndrome) ([place]) 05/12/2015   Lobar pneumonia, unspecified organism ([place]) 05/12/2015    Pulmonary sarcoidosis/clinical dx only  08/07/2013    Past Surgical History:  Procedure Laterality Date   AV FISTULA PLACEMENT Left 12/06/2020   Procedure: LEFT ARM BRACHIOCEPHALIC FISTULA CREATION;  Surgeon: Waynetta Sandy, MD;  Location: [facility];  Service: Vascular;  Laterality: Left;   [place] Left 03/03/2021   Procedure: LEFT SECOND STAGE BASILIC VEIN TRANSPOSITION;  Surgeon: Waynetta Sandy, MD;  [facility];  Service: Vascular;  Laterality: Left;   HERNIA REPAIR     umbilical hernia as a baby       Family History  Problem Relation Age of Onset   Hypertension Mother    Kidney failure Mother    Diabetes Father    Hypertension Father     Social History   Tobacco Use   Smoking status: Never   Smokeless tobacco: Never  Vaping Use   Vaping Use: Never used  Substance Use Topics   Alcohol use: Yes    Comment: beer on wkends   Drug use: No    Comment: last drink 09/29/20, per pt    Home Medications Prior to Admission medications   Medication Sig Start Date End Date Taking? Authorizing Provider  acetaminophen (TYLENOL) 500 MG tablet Take 1,000 mg by mouth every 6 (six) hours as needed for moderate pain.    [provider]  atorvastatin (LIPITOR) 10 MG tablet Take 10 mg by mouth daily. 09/10/20   [provider]  carvedilol (COREG) 25 MG tablet  Take 1 tablet (25 mg total) by mouth 2 (two) times daily with a meal. 05/08/17   Barton Dubois, MD  Cholecalciferol (VITAMIN D3) 50 MCG (2000 UT) capsule Take 2,000 Units by mouth daily. 09/11/20   [provider]  cloNIDine (CATAPRES) 0.3 MG tablet Take 1 tablet (0.3 mg total) by mouth every 8 (eight) hours. 10/11/20   Sheikh, Omair Latif, DO  fluticasone (FLONASE) 50 MCG/ACT nasal spray Place 1 spray into both nostrils daily as needed for allergies. 08/29/20   [provider]  furosemide (LASIX) 80 MG tablet Take 160 mg by mouth 2 (two) times daily.     [provider]  hydrALAZINE (APRESOLINE) 50 MG tablet Take 50 mg by mouth 3 (three) times daily. 10/18/20   [provider]  isosorbide mononitrate (IMDUR) 30 MG 24 hr tablet Take 30 mg by mouth daily. 11/17/20   [provider]  isosorbide-hydrALAZINE (BIDIL) 20-37.5 MG tablet Take 2 tablets by mouth 2 (two) times daily. Patient not taking: Reported on 03/01/2021 10/11/20   Raiford Noble Latif, DO  metolazone (ZAROXOLYN) 2.5 MG tablet Take 2.5 mg by mouth once a week. 02/08/21   [provider]  omeprazole (PRILOSEC) 40 MG capsule Take 40 mg by mouth daily.    [provider]  ondansetron (ZOFRAN) 4 MG tablet Take 1 tablet (4 mg total) by mouth every 6 (six) hours as needed for nausea. 10/11/20   Raiford Noble Latif, DO  oxyCODONE-acetaminophen (PERCOCET) 5-325 MG tablet Take 1 tablet by mouth every 6 (six) hours as needed for severe pain. 03/03/21 03/03/22  Baglia, Corrina, PA-C  sodium bicarbonate 650 MG tablet Take 650 mg by mouth 2 (two) times daily.    [provider]  lisinopril-hydrochlorothiazide (PRINZIDE,ZESTORETIC) 20-25 MG per tablet Take 1 tablet by mouth daily.  06/27/13  [provider]    Allergies    Amlodipine  Review of Systems   Review of Systems  Constitutional:  Negative for activity change, appetite change, chills, diaphoresis, fatigue and fever.  HENT:  Negative for congestion and trouble swallowing.   Eyes:  Negative for visual disturbance.  Respiratory:  Negative for chest tightness and shortness of breath.   Cardiovascular:  Negative for chest pain and palpitations.  Gastrointestinal:  Negative for abdominal pain, constipation, diarrhea and vomiting.  Musculoskeletal:  Negative for back pain and joint swelling.  Skin:  Positive for wound (open wound on left upper arm).  Neurological:  Negative for dizziness, weakness, light-headedness and headaches.   Physical Exam Updated Vital Signs BP (!) 159/103    Pulse 74   Temp (!) 97.5 F (36.4 C) (Oral)   Resp 18   SpO2 97%   Physical Exam Constitutional:      Appearance: Normal appearance.  HENT:     Head: Normocephalic and atraumatic.     Nose: Nose normal.     Mouth/Throat:     Mouth: Mucous membranes are moist.     Pharynx: Oropharynx is clear.  Eyes:     Extraocular Movements: Extraocular movements intact.     Conjunctiva/sclera: Conjunctivae normal.  Cardiovascular:     Rate and Rhythm: Normal rate and regular rhythm.  Pulmonary:     Effort: Pulmonary effort is normal.     Breath sounds: Normal breath sounds.  Abdominal:     General: Abdomen is flat.     Palpations: Abdomen is soft.  Musculoskeletal:     Cervical back: Normal range of motion and neck supple.  Skin:  General: Skin is warm and dry.     Comments: Open skin wound as pictured below without signs of infection  Neurological:     Mental Status: He is alert.       ED Results / Procedures / Treatments   Labs (all labs ordered are listed, but only abnormal results are displayed) Labs Reviewed  COMPREHENSIVE METABOLIC PANEL - Abnormal; Notable for the following components:      Result Value   CO2 19 (*)    Glucose, Bld 172 (*)    BUN 74 (*)    Creatinine, Ser 7.27 (*)    Calcium 8.8 (*)    GFR, Estimated 8 (*)    Anion gap 16 (*)    All other components within normal limits  LACTIC ACID, PLASMA - Abnormal; Notable for the following components:   Lactic Acid, Venous 2.0 (*)    All other components within normal limits  CBC WITH DIFFERENTIAL/PLATELET - Abnormal; Notable for the following components:   WBC 11.1 (*)    RBC 3.79 (*)    Hemoglobin 11.2 (*)    HCT 35.5 (*)    Neutro Abs 9.6 (*)    All other components within normal limits  CULTURE, BLOOD (ROUTINE X 2)  CULTURE, BLOOD (ROUTINE X 2)  PROTIME-INR  LACTIC ACID, PLASMA  URINALYSIS, ROUTINE W REFLEX MICROSCOPIC    EKG None  Radiology DG Chest 2 View  Result Date:  03/04/2021 CLINICAL DATA:  Suspected sepsis EXAM: CHEST - 2 VIEW COMPARISON:  10/07/2020 FINDINGS: Basilar atelectasis or scarring. Of normal heart size. No pleural effusion. No acute osseous abnormality. IMPRESSION: Bibasilar atelectasis/scarring. Electronically Signed   By: Macy Mis M.D.   On: 03/04/2021 11:55    Procedures Procedures   Medications Ordered in ED Medications - No data to display  ED Course  I have reviewed the triage vital signs and the nursing notes.  Pertinent labs & imaging results that were available during my care of the patient were reviewed by me and considered in my medical decision making (see chart for details).    MDM Rules/Calculators/A&P  Ian Gregory. is a 55 y.o. male presenting with left upper extremity wound after vascular surgery procedure.  PMH significant for stage IV CKD, HTN, sarcoidosis, CHF, anemia.  Patient had left second stage basilic vein transposition completed yesterday by Dr. Donzetta Matters. Presents today with the skin layer of the most superior incision open. No drainage or erythema of the area.  WBC was elevated at 11.1 as well as lactic acid of 2, but would also be expected given recent surgery; hemoglobin stable at 11.2. Kidney function at baseline.   Consulted with vascular surgery Dr. Carlis Abbott who was able to view the images. Recommendation is to do wet-to-dry dressing, which can be changed daily with saline. He will set up a wound check for next week in the office as well as home health but patient will need supplies for the weekend. Will do education on changes with nursing.   Final Clinical Impression(s) / ED Diagnoses Final diagnoses:  Wound dehiscence    Rx / DC Orders ED Discharge Orders     None        Rise Patience, DO 03/04/21 1509    Lucrezia Starch, MD 03/06/21 [phone]

## 2021-03-04 NOTE — ED Triage Notes (Signed)
Pt had a revision of L arm fistula yesterday morning and states surgical site opened yesterday evening.  Not actively bleeding.

## 2021-03-04 NOTE — Discharge Instructions (Signed)
We will do wet-to-dry dressings on your arm that can be changed once daily with saline. You will have an appointment set up with you vascular surgeon for a wound check this week and they will also work on getting you set up with home health to help you.

## 2021-03-07 ENCOUNTER — Other Ambulatory Visit: Payer: Self-pay

## 2021-03-07 ENCOUNTER — Ambulatory Visit (INDEPENDENT_AMBULATORY_CARE_PROVIDER_SITE_OTHER): Payer: Commercial Managed Care - PPO | Admitting: Physician Assistant

## 2021-03-07 VITALS — BP 174/106 | HR 66 | Temp 98.0°F | Ht 75.0 in | Wt 266.9 lb

## 2021-03-07 DIAGNOSIS — N184 Chronic kidney disease, stage 4 (severe): Secondary | ICD-10-CM

## 2021-03-07 DIAGNOSIS — T8131XA Disruption of external operation (surgical) wound, not elsewhere classified, initial encounter: Secondary | ICD-10-CM

## 2021-03-07 NOTE — Progress Notes (Signed)
  POST OPERATIVE DIALYSIS ACCESS OFFICE NOTE    CC:  F/u for dialysis access surgery  HPI:  This is a 55 y.o. male who is s/p left basilic vein AVF who underwent second stage transposition procedure on 03/03/2021 by Dr. Donzetta Matters.  He presented to the ED the next day with dehiscence of his proximal upper arm wound. He denies hand pain, numbness, or fever.  CKD stage 3 not yet requiring HD.   Allergies  Allergen Reactions   Amlodipine Swelling    Current Outpatient Medications  Medication Sig Dispense Refill   acetaminophen (TYLENOL) 500 MG tablet Take 1,000 mg by mouth every 6 (six) hours as needed for moderate pain.     atorvastatin (LIPITOR) 10 MG tablet Take 10 mg by mouth daily.     carvedilol (COREG) 25 MG tablet Take 1 tablet (25 mg total) by mouth 2 (two) times daily with a meal. 60 tablet 1   Cholecalciferol (VITAMIN D3) 50 MCG (2000 UT) capsule Take 2,000 Units by mouth daily.     cloNIDine (CATAPRES) 0.3 MG tablet Take 1 tablet (0.3 mg total) by mouth every 8 (eight) hours. 60 tablet 11   fluticasone (FLONASE) 50 MCG/ACT nasal spray Place 1 spray into both nostrils daily as needed for allergies.     furosemide (LASIX) 80 MG tablet Take 160 mg by mouth 2 (two) times daily.     hydrALAZINE (APRESOLINE) 50 MG tablet Take 50 mg by mouth 3 (three) times daily.     isosorbide mononitrate (IMDUR) 30 MG 24 hr tablet Take 30 mg by mouth daily.     isosorbide-hydrALAZINE (BIDIL) 20-37.5 MG tablet Take 2 tablets by mouth 2 (two) times daily. (Patient not taking: Reported on 03/01/2021) 60 tablet 0   metolazone (ZAROXOLYN) 2.5 MG tablet Take 2.5 mg by mouth once a week.     omeprazole (PRILOSEC) 40 MG capsule Take 40 mg by mouth daily.     ondansetron (ZOFRAN) 4 MG tablet Take 1 tablet (4 mg total) by mouth every 6 (six) hours as needed for nausea. 20 tablet 0   oxyCODONE-acetaminophen (PERCOCET) 5-325 MG tablet Take 1 tablet by mouth every 6 (six) hours as needed for severe pain. 20 tablet 0    sodium bicarbonate 650 MG tablet Take 650 mg by mouth 2 (two) times daily.     No current facility-administered medications for this visit.     ROS:  See HPI  Vitals:   03/07/21 1546  BP: (!) 174/106  Pulse: 66  Temp: 98 F (36.7 C)  SpO2: 96%     Physical Exam:  General appearance: awake, alert in NAD Respirations: unlabored; no dyspnea at rest Left upper extremity: Mild upper arm edema and ecchymosis. Hand is warm with 5/5 grip strength. Motor function and sensation intact. Good bruit and thrill in fistula. 2+ radial pulse. Incision(s): Proximal upper arm incision with skin edge separation. No drainage or signs of infection. AC and distal skip incisions well approximated. No active bleeding or hematoma.        Assessment/Plan:   -pt does not have evidence of steal syndrome -his proximal left upper arm incision is healing by secondary intention. Continue damp to dry dressing changes. Will place ace wrap for edema. Instructed patient to elevate left arm above heart level several times a day. -follow-up in 3 weeks  Barbie Banner, PA-C 03/07/2021 3:38 PM [facility] [phone]  Clinic MD:  Dr. Stanford Breed

## 2021-03-08 ENCOUNTER — Telehealth: Payer: Self-pay

## 2021-03-08 NOTE — Telephone Encounter (Signed)
S/w Lattie Haw @ [facility] - unable to service pt due to [employer] plan - called and s/w pt and he stated that he is not having any trouble with the dressing changes. Pt agreed to not having home health services at this time. Advised to give our office a call or if after hours report to the nearest ED,  if he has trouble with the dressing changes or if he experiences signs of infection or abnormal wound changes - pt verbalized understanding and agreed with this plan

## 2021-03-09 LAB — CULTURE, BLOOD (ROUTINE X 2): Culture: NO GROWTH

## 2021-03-14 ENCOUNTER — Telehealth: Payer: Self-pay

## 2021-03-14 NOTE — Telephone Encounter (Signed)
Patient is s/p L BVT on 10/14 - calls today to report that over the last 2-3 days his arm has started to drain yellowish pus. Says he has no sense of smell, so unsure if drainage is malodorous. He denies fever or chills. Says his arm is swollen and sore. Placed on schedule tomorrow for wound check.

## 2021-03-15 ENCOUNTER — Other Ambulatory Visit: Payer: Self-pay

## 2021-03-15 ENCOUNTER — Ambulatory Visit (INDEPENDENT_AMBULATORY_CARE_PROVIDER_SITE_OTHER): Payer: Commercial Managed Care - PPO | Admitting: Physician Assistant

## 2021-03-15 VITALS — BP 161/100 | HR 67 | Temp 98.3°F | Resp 16 | Ht 75.0 in | Wt 265.5 lb

## 2021-03-15 DIAGNOSIS — N184 Chronic kidney disease, stage 4 (severe): Secondary | ICD-10-CM

## 2021-03-15 DIAGNOSIS — T8131XA Disruption of external operation (surgical) wound, not elsewhere classified, initial encounter: Secondary | ICD-10-CM

## 2021-03-15 MED ORDER — DAKINS (1/2 STRENGTH) 0.25 % EX SOLN: 1.0000 "application " | Freq: Once | CUTANEOUS | 0 refills | Status: AC

## 2021-03-15 NOTE — Progress Notes (Signed)
  POST OPERATIVE OFFICE NOTE    CC:  F/u for surgery  HPI:  This is a 55 y.o. male who is s/p second stage basilic fistula creation  on 03/03/21 by Dr. Donzetta Matters.  He had a superficial dehiscence of the proximal incision.  He was placed on wet to dry dressing changes daily.  Cultures taken in the ER with no growth for 5 days.    Pt returns today for follow up.  Pt states he has had no fever or chills.  Allergies  Allergen Reactions   Amlodipine Swelling    Current Outpatient Medications  Medication Sig Dispense Refill   acetaminophen (TYLENOL) 500 MG tablet Take 1,000 mg by mouth every 6 (six) hours as needed for moderate pain.     atorvastatin (LIPITOR) 10 MG tablet Take 10 mg by mouth daily.     carvedilol (COREG) 25 MG tablet Take 1 tablet (25 mg total) by mouth 2 (two) times daily with a meal. 60 tablet 1   Cholecalciferol (VITAMIN D3) 50 MCG (2000 UT) capsule Take 2,000 Units by mouth daily.     cloNIDine (CATAPRES) 0.3 MG tablet Take 1 tablet (0.3 mg total) by mouth every 8 (eight) hours. 60 tablet 11   fluticasone (FLONASE) 50 MCG/ACT nasal spray Place 1 spray into both nostrils daily as needed for allergies.     furosemide (LASIX) 80 MG tablet Take 160 mg by mouth 2 (two) times daily.     hydrALAZINE (APRESOLINE) 50 MG tablet Take 50 mg by mouth 3 (three) times daily.     isosorbide mononitrate (IMDUR) 30 MG 24 hr tablet Take 30 mg by mouth daily.     isosorbide-hydrALAZINE (BIDIL) 20-37.5 MG tablet Take 2 tablets by mouth 2 (two) times daily. (Patient not taking: Reported on 03/07/2021) 60 tablet 0   metolazone (ZAROXOLYN) 2.5 MG tablet Take 2.5 mg by mouth once a week.     omeprazole (PRILOSEC) 40 MG capsule Take 40 mg by mouth daily. (Patient not taking: Reported on 03/15/2021)     ondansetron (ZOFRAN) 4 MG tablet Take 1 tablet (4 mg total) by mouth every 6 (six) hours as needed for nausea. 20 tablet 0   oxyCODONE-acetaminophen (PERCOCET) 5-325 MG tablet Take 1 tablet by mouth  every 6 (six) hours as needed for severe pain. 20 tablet 0   sodium bicarbonate 650 MG tablet Take 650 mg by mouth 2 (two) times daily.     No current facility-administered medications for this visit.     ROS:  See HPI  Physical Exam:     Incision:  beefy red base with green drainage on the old dressing. Extremities:  motor, sensation and palpable radial pulse left UE.    Assessment/Plan:  This is a 55 y.o. male who is s/p:second stage basilic vein fistula with proximal incision superficial dehiscence.  He has pseudomonas drainage on the old dressing.   I ordered Dakin's solution wet to dry dressing changes daily.  He will pick up the prescription tomorrow at [facility] on [address]  F/U in 2 weeks for wound check.  He is currently not requiring HD.     Roxy Horseman PA-C  [facility] [phone]   Clinic MD:  Donzetta Matters

## 2021-03-28 ENCOUNTER — Ambulatory Visit: Payer: Commercial Managed Care - PPO

## 2021-11-23 HISTORY — PX: LAPAROSCOPY: SHX197

## 2021-12-05 ENCOUNTER — Encounter: Payer: Self-pay | Admitting: Gastroenterology

## 2022-01-04 ENCOUNTER — Encounter: Payer: Commercial Managed Care - PPO | Admitting: Gastroenterology

## 2022-01-23 ENCOUNTER — Encounter: Payer: Self-pay | Admitting: Gastroenterology

## 2022-01-23 ENCOUNTER — Ambulatory Visit (INDEPENDENT_AMBULATORY_CARE_PROVIDER_SITE_OTHER): Payer: BC Managed Care – PPO | Admitting: Gastroenterology

## 2022-01-23 VITALS — BP 106/68 | HR 69 | Ht 75.0 in | Wt 263.4 lb

## 2022-01-23 DIAGNOSIS — Z1212 Encounter for screening for malignant neoplasm of rectum: Secondary | ICD-10-CM

## 2022-01-23 DIAGNOSIS — Z800 Family history of malignant neoplasm of digestive organs: Secondary | ICD-10-CM | POA: Diagnosis not present

## 2022-01-23 DIAGNOSIS — Z992 Dependence on renal dialysis: Secondary | ICD-10-CM

## 2022-01-23 DIAGNOSIS — N186 End stage renal disease: Secondary | ICD-10-CM

## 2022-01-23 DIAGNOSIS — D869 Sarcoidosis, unspecified: Secondary | ICD-10-CM

## 2022-01-23 DIAGNOSIS — I509 Heart failure, unspecified: Secondary | ICD-10-CM

## 2022-01-23 DIAGNOSIS — Z1211 Encounter for screening for malignant neoplasm of colon: Secondary | ICD-10-CM

## 2022-01-23 NOTE — Progress Notes (Signed)
Chief Complaint: Colon cancer screening   Referring Provider:     Phineas Inches, MD    HPI:     Ian Gregory. is a 56 y.o. male with a history of interstitial glomerulonephritis, ESRD on HD MWF, LUE AV fistula, CHF (TTE 09/2021: EF 55-60%, moderate LVH), HTN, HLD, sarcoidosis, arthritis, referred to the [facility]ology Clinic for evaluation of colonoscopy for routine colon cancer screening and as part of his kidney transplant work-up.  No previous colon cancer screening.  He is otherwise without any active GI symptoms.  No hematochezia, melena, change in bowel habits.  Maintains good p.o. intake, weight stable.    Fhx notable for mother with CRC, age <60 at dx.   On 11/23/2021, he underwent diagnostic laparoscopy with biopsy at [facility] for evaluation of a few enlarged and several prominent retroperitoneal and mesenteric lymph nodes noted on CT A/P during kidney transplant evaluation screening.  Path: Nonnecrotizing granulomatous lymphadenitis, compatible with history of sarcoidosis.  Negative for malignancy.    -09/29/2021: Nuc med myocardial perfusion scan: EF > 60%, no evidence of ischemia or scar - 09/29/2021: CT A/P: Debris-filled stomach, otherwise normal appearing GI tract.  Normal liver, spleen, pancreas.  Mild calcified atherosclerotic disease, mild bilateral renal cortical atrophy can panel with chronic medical renal disease, few enlarged and several prominent retroperitoneal and mesenteric lymph nodes   Past Medical History:  Diagnosis Date   Anemia    Arthritis    CHF (congestive heart failure) (HCC)    CKD (chronic kidney disease) stage 3, GFR 30-59 ml/min (HCC)    Stage 4   GERD (gastroesophageal reflux disease)    HTN (hypertension)    Pneumonia    Sarcoidosis      Past Surgical History:  Procedure Laterality Date   AV FISTULA PLACEMENT Left 12/06/2020   Procedure: LEFT ARM BRACHIOCEPHALIC FISTULA CREATION;  Surgeon: Waynetta Sandy, MD;   Location: [place];  Service: Vascular;  Laterality: Left;   [place] Left 03/03/2021   Procedure: LEFT SECOND STAGE BASILIC VEIN TRANSPOSITION;  Surgeon: Waynetta Sandy, MD;  Location: [facility];  Service: Vascular;  Laterality: Left;   HERNIA REPAIR     umbilical hernia as a baby   LAPAROSCOPY  11/23/2021   [facility]   Family History  Problem Relation Age of Onset   Hypertension Mother    Kidney failure Mother    Colon cancer Mother    Diabetes Father    Hypertension Father    Esophageal cancer Neg Hx    Social History   Tobacco Use   Smoking status: Never   Smokeless tobacco: Never  Vaping Use   Vaping Use: Never used  Substance Use Topics   Alcohol use: Yes    Comment: beer on wkends   Drug use: No    Comment: last drink 09/29/20, per pt   Current Outpatient Medications  Medication Sig Dispense Refill   acetaminophen (TYLENOL) 500 MG tablet Take 1,000 mg by mouth every 6 (six) hours as needed for moderate pain.     atorvastatin (LIPITOR) 10 MG tablet Take 10 mg by mouth daily.     carvedilol (COREG) 25 MG tablet Take 1 tablet (25 mg total) by mouth 2 (two) times daily with a meal. 60 tablet 1   Cholecalciferol (VITAMIN D3) 50 MCG (2000 UT) capsule Take 2,000 Units by mouth daily as needed (3 times day MWF).  cloNIDine (CATAPRES) 0.3 MG tablet Take 1 tablet (0.3 mg total) by mouth every 8 (eight) hours. 60 tablet 11   fluticasone (FLONASE) 50 MCG/ACT nasal spray Place 1 spray into both nostrils daily as needed for allergies.     hydrALAZINE (APRESOLINE) 50 MG tablet Take 50 mg by mouth 3 (three) times daily.     isosorbide mononitrate (IMDUR) 30 MG 24 hr tablet Take 30 mg by mouth daily.     oxyCODONE-acetaminophen (PERCOCET) 5-325 MG tablet Take 1 tablet by mouth every 6 (six) hours as needed for severe pain. 20 tablet 0   sevelamer carbonate (RENVELA) 800 MG tablet Take 1 tablet by mouth with breakfast, with lunch, and with evening meal.      tadalafil (CIALIS) 10 MG tablet Take 10 mg by mouth daily as needed.     ondansetron (ZOFRAN) 4 MG tablet Take 1 tablet (4 mg total) by mouth every 6 (six) hours as needed for nausea. (Patient not taking: Reported on 01/23/2022) 20 tablet 0   No current facility-administered medications for this visit.   Allergies  Allergen Reactions   Amlodipine Swelling     Review of Systems: All systems reviewed and negative except where noted in HPI.     Physical Exam:    Wt Readings from Last 3 Encounters:  01/23/22 263 lb 6 oz (119.5 kg)  03/15/21 265 lb 8 oz (120.4 kg)  03/07/21 266 lb 14.4 oz (121.1 kg)    BP 106/68   Pulse 69   Ht $R'6\' 3"'aN$  (1.905 m)   Wt 263 lb 6 oz (119.5 kg)   BMI 32.92 kg/m  Constitutional:  Pleasant, in no acute distress. Psychiatric: Normal mood and affect. Behavior is normal. Neck supple. No cervical LAD. Cardiovascular: Normal rate, regular rhythm. No edema Pulmonary/chest: Effort normal and breath sounds normal. No wheezing, rales or rhonchi. Abdominal: Soft, nondistended, nontender. Neurological: Alert and oriented to person place and time. Skin: Skin is warm and dry. No rashes noted.   ASSESSMENT AND PLAN;   1) Family history of colon cancer 2) Colon cancer screening - Due for initial CRC screening.  Discussed screening modalities to include optical colonoscopy, Cologuard, FIT kit testing, virtual colonoscopy, and he would like to proceed with optical colonoscopy. - Schedule colonoscopy to be performed at [facility] due to elevated periprocedural risks  3) ESRD on HD 4) CHF 5) Sarcoidosis - Procedure to be scheduled at [facility] due to elevated periprocedural risks   The indications, risks, and benefits of colonoscopy were explained to the patient in detail. Risks include but are not limited to bleeding, perforation, adverse reaction to medications, and cardiopulmonary compromise. Sequelae include but are not limited to the possibility of surgery,  hospitalization, and mortality. The patient verbalized understanding and wished to proceed. All questions answered, referred to the scheduler and bowel prep ordered. Further recommendations pending results of the exam.     Lavena Bullion, DO, FACG  01/23/2022, 8:33 AM   Cc: Phineas Inches, MD

## 2022-01-23 NOTE — Patient Instructions (Addendum)
_______________________________________________________  If you are age 56 or older, your body mass index should be between 23-30. Your Body mass index is 32.92 kg/m. If this is out of the aforementioned range listed, please consider follow up with your Primary Care Provider.  If you are age 75 or younger, your body mass index should be between 19-25. Your Body mass index is 32.92 kg/m. If this is out of the aformentioned range listed, please consider follow up with your Primary Care Provider.   ________________________________________________________  The [facility] providers would like to encourage you to use [facility] to communicate with providers for non-urgent requests or questions.  Due to long hold times on the telephone, sending your provider a message by [facility] may be a faster and more efficient way to get a response.  Please allow 48 business hours for a response.  Please remember that this is for non-urgent requests.  _______________________________________________________  Ian Gregory have been scheduled for a colonoscopy. Please follow written instructions given to you at your visit today.  Please pick up your prep supplies at the pharmacy within the next 1-3 days. If you use inhalers (even only as needed), please bring them with you on the day of your procedure.  It was a pleasure to see you today!  Vito Cirigliano, D.O.

## 2022-03-08 ENCOUNTER — Encounter (HOSPITAL_COMMUNITY): Payer: Self-pay | Admitting: Gastroenterology

## 2022-03-08 NOTE — Progress Notes (Signed)
Attempted to obtain medical history via telephone, unable to reach at this time. HIPAA compliant voicemail message left requesting return call to pre surgical testing department. 

## 2022-03-15 ENCOUNTER — Ambulatory Visit (HOSPITAL_COMMUNITY): Payer: BC Managed Care – PPO | Admitting: Anesthesiology

## 2022-03-15 ENCOUNTER — Ambulatory Visit (HOSPITAL_COMMUNITY)
Admission: RE | Admit: 2022-03-15 | Discharge: 2022-03-15 | Disposition: A | Discharge status: Home / Self Care | Payer: BC Managed Care – PPO | Attending: Gastroenterology | Admitting: Gastroenterology

## 2022-03-15 ENCOUNTER — Encounter (HOSPITAL_COMMUNITY)
Admission: RE | Disposition: A | Discharge status: Home / Self Care | Payer: Self-pay | Source: Home / Self Care | Attending: Gastroenterology

## 2022-03-15 ENCOUNTER — Encounter (HOSPITAL_COMMUNITY): Payer: Self-pay | Admitting: Gastroenterology

## 2022-03-15 ENCOUNTER — Other Ambulatory Visit: Payer: Self-pay

## 2022-03-15 DIAGNOSIS — M1990 Unspecified osteoarthritis, unspecified site: Secondary | ICD-10-CM | POA: Diagnosis not present

## 2022-03-15 DIAGNOSIS — Z1212 Encounter for screening for malignant neoplasm of rectum: Secondary | ICD-10-CM

## 2022-03-15 DIAGNOSIS — N186 End stage renal disease: Secondary | ICD-10-CM | POA: Diagnosis not present

## 2022-03-15 DIAGNOSIS — Z79899 Other long term (current) drug therapy: Secondary | ICD-10-CM | POA: Insufficient documentation

## 2022-03-15 DIAGNOSIS — K635 Polyp of colon: Secondary | ICD-10-CM

## 2022-03-15 DIAGNOSIS — Z6833 Body mass index (BMI) 33.0-33.9, adult: Secondary | ICD-10-CM | POA: Diagnosis not present

## 2022-03-15 DIAGNOSIS — E669 Obesity, unspecified: Secondary | ICD-10-CM | POA: Insufficient documentation

## 2022-03-15 DIAGNOSIS — Z1211 Encounter for screening for malignant neoplasm of colon: Secondary | ICD-10-CM | POA: Diagnosis present

## 2022-03-15 DIAGNOSIS — D125 Benign neoplasm of sigmoid colon: Secondary | ICD-10-CM | POA: Diagnosis not present

## 2022-03-15 DIAGNOSIS — Z800 Family history of malignant neoplasm of digestive organs: Secondary | ICD-10-CM | POA: Diagnosis not present

## 2022-03-15 DIAGNOSIS — I120 Hypertensive chronic kidney disease with stage 5 chronic kidney disease or end stage renal disease: Secondary | ICD-10-CM | POA: Insufficient documentation

## 2022-03-15 HISTORY — PX: POLYPECTOMY: SHX5525

## 2022-03-15 HISTORY — PX: COLONOSCOPY WITH PROPOFOL: SHX5780

## 2022-03-15 SURGERY — COLONOSCOPY WITH PROPOFOL
Anesthesia: Monitor Anesthesia Care

## 2022-03-15 MED ORDER — PROPOFOL 500 MG/50ML IV EMUL
INTRAVENOUS | Status: DC | PRN
  Administered 2022-03-15: 125 ug/kg/min via INTRAVENOUS

## 2022-03-15 MED ORDER — ONDANSETRON HCL 4 MG/2ML IJ SOLN
INTRAMUSCULAR | Status: DC | PRN
  Administered 2022-03-15: 4 mg via INTRAVENOUS

## 2022-03-15 MED ORDER — PROPOFOL 10 MG/ML IV BOLUS
INTRAVENOUS | Status: AC
  Filled 2022-03-15: qty 20

## 2022-03-15 MED ORDER — PROPOFOL 10 MG/ML IV BOLUS
INTRAVENOUS | Status: DC | PRN
  Administered 2022-03-15: 30 mg via INTRAVENOUS

## 2022-03-15 MED ORDER — PROPOFOL 500 MG/50ML IV EMUL
INTRAVENOUS | Status: AC
  Filled 2022-03-15: qty 50

## 2022-03-15 MED ORDER — SODIUM CHLORIDE 0.9 % IV SOLN: INTRAVENOUS | Status: DC

## 2022-03-15 MED ORDER — LIDOCAINE 2% (20 MG/ML) 5 ML SYRINGE
INTRAMUSCULAR | Status: DC | PRN
  Administered 2022-03-15: 60 mg via INTRAVENOUS

## 2022-03-15 SURGICAL SUPPLY — 22 items

## 2022-03-15 NOTE — H&P (Signed)
GASTROENTEROLOGY PROCEDURE H&P NOTE   Primary Care Physician: [facility]    Reason for Procedure:  Colon Cancer screening, family history of colon cancer  Plan:    Colonoscopy  Patient is appropriate for endoscopic procedure(s) at [facility] Endoscopy unit.  The nature of the procedure, as well as the risks, benefits, and alternatives were carefully and thoroughly reviewed with the patient. Ample time for discussion and questions allowed. The patient understood, was satisfied, and agreed to proceed.     HPI: Ian Gregory. is a 56 y.o. male who presents for colonoscopy for colon cancer screening.  Family history notable for mother with CRC, diagnosed age <41.  No previous colonoscopy.  History of ESRD and undergoing kidney transplant work-up.  Procedure scheduled at [facility] due to elevated periprocedural risks from underlying comorbidities, to include ESRD, CHF, sarcoidosis.  Past Medical History:  Diagnosis Date   Anemia    Arthritis    CHF (congestive heart failure) (HCC)    CKD (chronic kidney disease) stage 3, GFR 30-59 ml/min (HCC)    Stage 4   GERD (gastroesophageal reflux disease)    HTN (hypertension)    Pneumonia    Sarcoidosis     Past Surgical History:  Procedure Laterality Date   AV FISTULA PLACEMENT Left 12/06/2020   Procedure: LEFT ARM BRACHIOCEPHALIC FISTULA CREATION;  Surgeon: Waynetta Sandy, MD;  Location: [place];  Service: Vascular;  Laterality: Left;   [place] Left 03/03/2021   Procedure: LEFT SECOND STAGE BASILIC VEIN TRANSPOSITION;  Surgeon: Waynetta Sandy, MD;  Location: [place];  Service: Vascular;  Laterality: Left;   HERNIA REPAIR     umbilical hernia as a baby   LAPAROSCOPY  11/23/2021   [facility]    Prior to Admission medications   Medication Sig Start Date End Date Taking? Authorizing Provider  acetaminophen (TYLENOL) 500 MG tablet Take 1,000 mg by mouth  every 6 (six) hours as needed for moderate pain.   Yes [provider]  atorvastatin (LIPITOR) 10 MG tablet Take 10 mg by mouth daily. 09/10/20  Yes [provider]  carvedilol (COREG) 25 MG tablet Take 1 tablet (25 mg total) by mouth 2 (two) times daily with a meal. 05/08/17  Yes Barton Dubois, MD  cloNIDine (CATAPRES) 0.3 MG tablet Take 1 tablet (0.3 mg total) by mouth every 8 (eight) hours. 10/11/20  Yes Sheikh, Omair Latif, DO  hydrALAZINE (APRESOLINE) 50 MG tablet Take 50 mg by mouth 3 (three) times daily. 10/18/20  Yes [provider]  isosorbide mononitrate (IMDUR) 30 MG 24 hr tablet Take 30 mg by mouth daily. 11/17/20  Yes [provider]  sevelamer carbonate (RENVELA) 800 MG tablet Take 800 mg by mouth with breakfast, with lunch, and with evening meal. 12/21/21  Yes [provider]  tadalafil (CIALIS) 10 MG tablet Take 10 mg by mouth daily as needed for erectile dysfunction. 01/06/22  Yes [provider]  fluticasone (FLONASE) 50 MCG/ACT nasal spray Place 1 spray into both nostrils daily as needed for allergies or rhinitis. 08/29/20   [provider]  lisinopril-hydrochlorothiazide (PRINZIDE,ZESTORETIC) 20-25 MG per tablet Take 1 tablet by mouth daily.  06/27/13  [provider]    Current Facility-Administered Medications  Medication Dose Route Frequency Provider Last Rate Last Admin   0.9 %  sodium chloride infusion   Intravenous Continuous Sophee Mckimmy V, DO        Allergies as  of 01/23/2022 - Review Complete 01/23/2022  Allergen Reaction Noted   Amlodipine Swelling 04/26/2020    Family History  Problem Relation Age of Onset   Hypertension Mother    Kidney failure Mother    Colon cancer Mother    Diabetes Father    Hypertension Father    Esophageal cancer Neg Hx     Social History   Socioeconomic History   Marital status: Single    Spouse name: Not on file   Number of children: Not on file   Years of  education: Not on file   Highest education level: Not on file  Occupational History   Occupation: Freight forwarder     Employer: [employer]  Tobacco Use   Smoking status: Never   Smokeless tobacco: Never  Vaping Use   Vaping Use: Never used  Substance and Sexual Activity   Alcohol use: Yes    Comment: beer on wkends   Drug use: No    Comment: last drink 09/29/20, per pt   Sexual activity: Not on file  Other Topics Concern   Not on file  Social History Narrative   Not on file   Social Determinants of Health   Financial Resource Strain: Not on file  Food Insecurity: Not on file  Transportation Needs: Not on file  Physical Activity: Not on file  Stress: Not on file  Social Connections: Not on file  Intimate Partner Violence: Not on file    Physical Exam: Vital signs in last 24 hours: '@BP'$  122/69   Pulse 74   Temp 97.9 F (36.6 C) (Temporal)   Resp 12   Ht '6\' 3"'$  (1.905 m)   Wt 120.2 kg   SpO2 95%   BMI 33.12 kg/m  GEN: NAD EYE: Sclerae anicteric ENT: MMM CV: Non-tachycardic Pulm: CTA b/l GI: Soft, NT/ND NEURO:  Alert & Oriented x Salome, DO [facility]   03/15/2022 10:07 AM

## 2022-03-15 NOTE — Anesthesia Procedure Notes (Signed)
Procedure Name: MAC Date/Time: 03/15/2022 10:45 AM  Performed by: Claudia Desanctis, CRNAPre-anesthesia Checklist: Patient identified, Emergency Drugs available, Suction available and Patient being monitored Patient Re-evaluated:Patient Re-evaluated prior to induction Oxygen Delivery Method: Simple face mask

## 2022-03-15 NOTE — Anesthesia Preprocedure Evaluation (Signed)
Anesthesia Evaluation  Patient identified by MRN, date of birth, ID band Patient awake    Reviewed: Allergy & Precautions, NPO status , Patient's Chart, lab work & pertinent test results, reviewed documented beta blocker date and time   Airway Mallampati: II  TM Distance: >3 FB Neck ROM: Full    Dental  (+) Dental Advisory Given, Poor Dentition, Missing, Chipped,    Pulmonary  sarcoid   Pulmonary exam normal breath sounds clear to auscultation       Cardiovascular hypertension, Pt. on medications and Pt. on home beta blockers Normal cardiovascular exam Rhythm:Regular Rate:Normal     Neuro/Psych negative neurological ROS  negative psych ROS   GI/Hepatic Neg liver ROS, GERD  Medicated and Controlled,  Endo/Other  Obesity BMI 33  Renal/GU ESRF and DialysisRenal diseaseK 3.5  negative genitourinary   Musculoskeletal  (+) Arthritis , Osteoarthritis,    Abdominal (+) + obese,   Peds negative pediatric ROS (+)  Hematology  (+) Blood dyscrasia, anemia , REFUSES BLOOD PRODUCTS, hct 35   Anesthesia Other Findings   Reproductive/Obstetrics negative OB ROS                             Anesthesia Physical  Anesthesia Plan  ASA: 4  Anesthesia Plan: MAC   Post-op Pain Management: Minimal or no pain anticipated   Induction: Intravenous  PONV Risk Score and Plan: 1 and Ondansetron and Treatment may vary due to age or medical condition  Airway Management Planned: Nasal Cannula  Additional Equipment: None  Intra-op Plan:   Post-operative Plan:   Informed Consent: I have reviewed the patients History and Physical, chart, labs and discussed the procedure including the risks, benefits and alternatives for the proposed anesthesia with the patient or authorized representative who has indicated his/her understanding and acceptance.     Dental advisory given  Plan Discussed with:  CRNA  Anesthesia Plan Comments:         Anesthesia Quick Evaluation

## 2022-03-15 NOTE — Anesthesia Postprocedure Evaluation (Signed)
Anesthesia Post Note  Patient: Ian Gregory.  Procedure(s) Performed: COLONOSCOPY WITH PROPOFOL POLYPECTOMY     Patient location during evaluation: PACU Anesthesia Type: MAC Level of consciousness: awake and alert Pain management: pain level controlled Vital Signs Assessment: post-procedure vital signs reviewed and stable Respiratory status: spontaneous breathing, nonlabored ventilation and respiratory function stable Cardiovascular status: blood pressure returned to baseline and stable Postop Assessment: no apparent nausea or vomiting Anesthetic complications: no   No notable events documented.  Last Vitals:  Vitals:   03/15/22 1135 03/15/22 1140  BP: 96/63 106/73  Pulse:  67  Resp:  18  Temp:    SpO2:  93%    Last Pain:  Vitals:   03/15/22 1135  TempSrc:   PainSc: 0-No pain                 Lynda Rainwater

## 2022-03-15 NOTE — Discharge Instructions (Signed)
YOU HAD AN ENDOSCOPIC PROCEDURE TODAY: Refer to the procedure report and other information in the discharge instructions given to you for any specific questions about what was found during the examination. If this information does not answer your questions, please call [place] office at [phone] to clarify.  ° °YOU SHOULD EXPECT: Some feelings of bloating in the abdomen. Passage of more gas than usual. Walking can help get rid of the air that was put into your GI tract during the procedure and reduce the bloating. If you had a lower endoscopy (such as a colonoscopy or flexible sigmoidoscopy) you may notice spotting of blood in your stool or on the toilet paper. Some abdominal soreness may be present for a day or two, also. ° °DIET: Your first meal following the procedure should be a light meal and then it is ok to progress to your normal diet. A half-sandwich or bowl of soup is an example of a good first meal. Heavy or fried foods are harder to digest and may make you feel nauseous or bloated. Drink plenty of fluids but you should avoid alcoholic beverages for 24 hours. If you had a esophageal dilation, please see attached instructions for diet.   ° °ACTIVITY: Your care partner should take you home directly after the procedure. You should plan to take it easy, moving slowly for the rest of the day. You can resume normal activity the day after the procedure however YOU SHOULD NOT DRIVE, use power tools, machinery or perform tasks that involve climbing or major physical exertion for 24 hours (because of the sedation medicines used during the test).  ° °SYMPTOMS TO REPORT IMMEDIATELY: °A gastroenterologist can be reached at any hour. Please call [phone]  for any of the following symptoms:  °Following lower endoscopy (colonoscopy, flexible sigmoidoscopy) °Excessive amounts of blood in the stool  °Significant tenderness, worsening of abdominal pains  °Swelling of the abdomen that is new, acute  °Fever of 100° or  higher  °Following upper endoscopy (EGD, EUS, ERCP, esophageal dilation) °Vomiting of blood or coffee ground material  °New, significant abdominal pain  °New, significant chest pain or pain under the shoulder blades  °Painful or persistently difficult swallowing  °New shortness of breath  °Black, tarry-looking or red, bloody stools ° °FOLLOW UP:  °If any biopsies were taken you will be contacted by phone or by letter within the next 1-3 weeks. Call [phone]  if you have not heard about the biopsies in 3 weeks.  °Please also call with any specific questions about appointments or follow up tests. ° °

## 2022-03-15 NOTE — Transfer of Care (Signed)
Immediate Anesthesia Transfer of Care Note  Patient: Ian Gregory.  Procedure(s) Performed: COLONOSCOPY WITH PROPOFOL POLYPECTOMY  Patient Location: Endoscopy Unit  Anesthesia Type:MAC  Level of Consciousness: drowsy  Airway & Oxygen Therapy: Patient Spontanous Breathing and Patient connected to face mask  Post-op Assessment: Report given to RN and Post -op Vital signs reviewed and stable  Post vital signs: Reviewed and stable  Last Vitals:  Vitals Value Taken Time  BP    Temp    Pulse 65 03/15/22 1113  Resp 13 03/15/22 1113  SpO2 97 % 03/15/22 1113  Vitals shown include unvalidated device data.  Last Pain:  Vitals:   03/15/22 0959  TempSrc: Temporal  PainSc: 0-No pain         Complications: No notable events documented.

## 2022-03-15 NOTE — Op Note (Signed)
[facility] Patient Name: Ian Gregory Procedure Date: 03/15/2022 MRN: [id number] Attending MD: Gerrit Heck , MD, [id number] Date of Birth: Sep 10, 1965 CSN: [id number] Age: 56 Admit Type: Outpatient Procedure:                Colonoscopy Indications:              Screening in patient at increased risk: Colorectal                            cancer in mother before age 69                           This is the patient's first colonoscopy. Otherwise,                            no recent GI symptoms. Providers:                Gerrit Heck, MD, Jaci Carrel, RN, Frazier Richards, Technician Referring MD:              Medicines:                Monitored Anesthesia Care Complications:            No immediate complications. Estimated Blood Loss:     Estimated blood loss was minimal. Procedure:                Pre-Anesthesia Assessment:                           - Prior to the procedure, a History and Physical                            was performed, and patient medications and                            allergies were reviewed. The patient's tolerance of                            previous anesthesia was also reviewed. The risks                            and benefits of the procedure and the sedation                            options and risks were discussed with the patient.                            All questions were answered, and informed consent                            was obtained. Prior Anticoagulants: The patient has                            taken no  anticoagulant or antiplatelet agents. ASA                            Grade Assessment: IV - A patient with severe                            systemic disease that is a constant threat to life.                            After reviewing the risks and benefits, the patient                            was deemed in satisfactory condition to undergo the                             procedure.                           After obtaining informed consent, the colonoscope                            was passed under direct vision. Throughout the                            procedure, the patient's blood pressure, pulse, and                            oxygen saturations were monitored continuously. The                            CF-HQ190L ([id number] Olympus colonoscope was                            introduced through the anus and advanced to the the                            cecum, identified by appendiceal orifice and                            ileocecal valve. The colonoscopy was performed                            without difficulty. The patient tolerated the                            procedure well. Following copious irrigation, the                            quality of the bowel preparation was good. The                            ileocecal valve, appendiceal orifice, and rectum  were photographed. Scope In: 10:47:02 AM Scope Out: 11:05:23 AM Scope Withdrawal Time: 0 hours 12 minutes 3 seconds  Total Procedure Duration: 0 hours 18 minutes 21 seconds  Findings:      The perianal and digital rectal examinations were normal.      A 5 mm polyp was found in the sigmoid colon. The polyp was sessile. The       polyp was removed with a cold snare. Resection and retrieval were       complete. Estimated blood loss was minimal.      The exam was otherwise normal throughout the remainder of the colon.      Retroflexion in the rectum was not performed due to anatomy- narrow       rectal vault. Anterograde views were otherwise normal appearing. Impression:               - One 5 mm polyp in the sigmoid colon, removed with                            a cold snare. Resected and retrieved.                           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 Anterograde views were normal.                           -  The remainder of the colon was normal appearing. Moderate Sedation:      Not Applicable - Patient had care per Anesthesia. Recommendation:           - Patient has a contact number available for                            emergencies. The signs and symptoms of potential                            delayed complications were discussed with the                            patient. Return to normal activities tomorrow.                            Written discharge instructions were provided to the                            patient.                           - Resume previous diet.                           - Continue present medications.                           - Await pathology results.                           -  Repeat colonoscopy in 5 years for surveillance                            due to family history.                           - Return to GI office PRN. 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 All rights reserved. The codes documented in this report are preliminary and upon coder review may  be revised to meet current compliance requirements. Gerrit Heck, MD 03/15/2022 11:14:07 AM Number of Addenda: 0

## 2022-03-15 NOTE — Interval H&P Note (Signed)
History and Physical Interval Note:  03/15/2022 10:09 AM  Ian Gregory.  has presented today for surgery, with the diagnosis of crc screening.  The various methods of treatment have been discussed with the patient and family. After consideration of risks, benefits and other options for treatment, the patient has consented to  Procedure(s): COLONOSCOPY WITH PROPOFOL (N/A) as a surgical intervention.  The patient's history has been reviewed, patient examined, no change in status, stable for surgery.  I have reviewed the patient's chart and labs.  Questions were answered to the patient's satisfaction.     Dominic Pea [name]

## 2022-03-16 LAB — SURGICAL PATHOLOGY

## 2022-03-18 ENCOUNTER — Encounter (HOSPITAL_COMMUNITY): Payer: Self-pay | Admitting: Gastroenterology

## 2023-08-16 ENCOUNTER — Other Ambulatory Visit (HOSPITAL_COMMUNITY)
Admission: EM | Admit: 2023-08-16 | Discharge: 2023-08-16 | Disposition: A | Discharge status: Home / Self Care | Source: Ambulatory Visit

## 2023-08-16 DIAGNOSIS — N390 Urinary tract infection, site not specified: Secondary | ICD-10-CM | POA: Insufficient documentation

## 2023-08-16 DIAGNOSIS — N189 Chronic kidney disease, unspecified: Secondary | ICD-10-CM | POA: Diagnosis not present

## 2023-08-16 DIAGNOSIS — Z9483 Pancreas transplant status: Secondary | ICD-10-CM | POA: Insufficient documentation

## 2023-08-16 DIAGNOSIS — Z789 Other specified health status: Secondary | ICD-10-CM | POA: Diagnosis not present

## 2023-08-16 DIAGNOSIS — B259 Cytomegaloviral disease, unspecified: Secondary | ICD-10-CM | POA: Insufficient documentation

## 2023-08-16 DIAGNOSIS — E1129 Type 2 diabetes mellitus with other diabetic kidney complication: Secondary | ICD-10-CM | POA: Insufficient documentation

## 2023-08-16 DIAGNOSIS — Z114 Encounter for screening for human immunodeficiency virus [HIV]: Secondary | ICD-10-CM | POA: Insufficient documentation

## 2023-08-16 DIAGNOSIS — B9689 Other specified bacterial agents as the cause of diseases classified elsewhere: Secondary | ICD-10-CM | POA: Insufficient documentation

## 2023-08-16 DIAGNOSIS — D899 Disorder involving the immune mechanism, unspecified: Secondary | ICD-10-CM | POA: Insufficient documentation

## 2023-08-16 DIAGNOSIS — D631 Anemia in chronic kidney disease: Secondary | ICD-10-CM | POA: Insufficient documentation

## 2023-08-16 DIAGNOSIS — Z09 Encounter for follow-up examination after completed treatment for conditions other than malignant neoplasm: Secondary | ICD-10-CM | POA: Insufficient documentation

## 2023-08-16 DIAGNOSIS — Z79899 Other long term (current) drug therapy: Secondary | ICD-10-CM | POA: Diagnosis not present

## 2023-08-16 DIAGNOSIS — Z940 Kidney transplant status: Secondary | ICD-10-CM | POA: Insufficient documentation

## 2023-08-16 LAB — BASIC METABOLIC PANEL WITH GFR
Anion gap: 16 — ABNORMAL HIGH (ref 5–15)
BUN: 68 mg/dL — ABNORMAL HIGH (ref 6–20)
CO2: 22 mmol/L (ref 22–32)
Calcium: 9 mg/dL (ref 8.9–10.3)
Chloride: 100 mmol/L (ref 98–111)
Creatinine, Ser: 6.86 mg/dL — ABNORMAL HIGH (ref 0.61–1.24)
GFR, Estimated: 9 mL/min — ABNORMAL LOW (ref >60)
Glucose, Bld: 105 mg/dL — ABNORMAL HIGH (ref 70–99)
Potassium: 4.3 mmol/L (ref 3.5–5.1)
Sodium: 138 mmol/L (ref 135–145)

## 2023-08-16 LAB — CBC WITH DIFFERENTIAL/PLATELET
Abs Immature Granulocytes: 0 10*3/uL (ref 0.00–0.07)
Basophils Absolute: 0 10*3/uL (ref 0.0–0.1)
Basophils Relative: 0 %
Eosinophils Absolute: 0.1 10*3/uL (ref 0.0–0.5)
Eosinophils Relative: 1 %
HCT: 33.9 % — ABNORMAL LOW (ref 39.0–52.0)
Hemoglobin: 11.4 g/dL — ABNORMAL LOW (ref 13.0–17.0)
Lymphocytes Relative: 4 %
Lymphs Abs: 0.2 10*3/uL — ABNORMAL LOW (ref 0.7–4.0)
MCH: 31.5 pg (ref 26.0–34.0)
MCHC: 33.6 g/dL (ref 30.0–36.0)
MCV: 93.6 fL (ref 80.0–100.0)
Monocytes Absolute: 0.7 10*3/uL (ref 0.1–1.0)
Monocytes Relative: 13 %
Neutro Abs: 4.3 10*3/uL (ref 1.7–7.7)
Neutrophils Relative %: 82 %
Platelets: 102 10*3/uL — ABNORMAL LOW (ref 150–400)
RBC: 3.62 MIL/uL — ABNORMAL LOW (ref 4.22–5.81)
RDW: 12.7 % (ref 11.5–15.5)
WBC: 5.3 10*3/uL (ref 4.0–10.5)
nRBC: 0 % (ref 0.0–0.2)
nRBC: 1 /100{WBCs} — ABNORMAL HIGH

## 2023-08-16 LAB — PHOSPHORUS: Phosphorus: 6.6 mg/dL — ABNORMAL HIGH (ref 2.5–4.6)

## 2023-08-16 LAB — MAGNESIUM: Magnesium: 2 mg/dL (ref 1.7–2.4)

## 2023-08-17 LAB — TACROLIMUS LEVEL: Tacrolimus (FK506) - LabCorp: 8.4 ng/mL (ref 5.0–20.0)

## 2023-08-19 ENCOUNTER — Other Ambulatory Visit (HOSPITAL_COMMUNITY)
Admission: RE | Admit: 2023-08-19 | Discharge: 2023-08-19 | Disposition: A | Discharge status: Home / Self Care | Source: Ambulatory Visit | Attending: Nephrology | Admitting: Nephrology

## 2023-08-19 DIAGNOSIS — E1129 Type 2 diabetes mellitus with other diabetic kidney complication: Secondary | ICD-10-CM | POA: Insufficient documentation

## 2023-08-19 DIAGNOSIS — D631 Anemia in chronic kidney disease: Secondary | ICD-10-CM | POA: Diagnosis not present

## 2023-08-19 DIAGNOSIS — E559 Vitamin D deficiency, unspecified: Secondary | ICD-10-CM | POA: Diagnosis not present

## 2023-08-19 DIAGNOSIS — Z9483 Pancreas transplant status: Secondary | ICD-10-CM | POA: Diagnosis not present

## 2023-08-19 DIAGNOSIS — B259 Cytomegaloviral disease, unspecified: Secondary | ICD-10-CM | POA: Diagnosis not present

## 2023-08-19 DIAGNOSIS — Z09 Encounter for follow-up examination after completed treatment for conditions other than malignant neoplasm: Secondary | ICD-10-CM | POA: Diagnosis not present

## 2023-08-19 DIAGNOSIS — D899 Disorder involving the immune mechanism, unspecified: Secondary | ICD-10-CM | POA: Diagnosis present

## 2023-08-19 DIAGNOSIS — Z79899 Other long term (current) drug therapy: Secondary | ICD-10-CM | POA: Insufficient documentation

## 2023-08-19 DIAGNOSIS — N189 Chronic kidney disease, unspecified: Secondary | ICD-10-CM | POA: Diagnosis not present

## 2023-08-19 DIAGNOSIS — Z940 Kidney transplant status: Secondary | ICD-10-CM | POA: Insufficient documentation

## 2023-08-19 DIAGNOSIS — Z789 Other specified health status: Secondary | ICD-10-CM | POA: Insufficient documentation

## 2023-08-19 DIAGNOSIS — Z114 Encounter for screening for human immunodeficiency virus [HIV]: Secondary | ICD-10-CM | POA: Diagnosis not present

## 2023-08-19 LAB — CBC WITH DIFFERENTIAL/PLATELET
Abs Immature Granulocytes: 0 10*3/uL (ref 0.00–0.07)
Basophils Absolute: 0 10*3/uL (ref 0.0–0.1)
Basophils Relative: 0 %
Eosinophils Absolute: 0.1 10*3/uL (ref 0.0–0.5)
Eosinophils Relative: 2 %
HCT: 29.8 % — ABNORMAL LOW (ref 39.0–52.0)
Hemoglobin: 10.1 g/dL — ABNORMAL LOW (ref 13.0–17.0)
Lymphocytes Relative: 2 %
Lymphs Abs: 0.1 10*3/uL — ABNORMAL LOW (ref 0.7–4.0)
MCH: 31.5 pg (ref 26.0–34.0)
MCHC: 33.9 g/dL (ref 30.0–36.0)
MCV: 92.8 fL (ref 80.0–100.0)
Monocytes Absolute: 0.4 10*3/uL (ref 0.1–1.0)
Monocytes Relative: 7 %
Neutro Abs: 5 10*3/uL (ref 1.7–7.7)
Neutrophils Relative %: 89 %
Platelets: 132 10*3/uL — ABNORMAL LOW (ref 150–400)
RBC: 3.21 MIL/uL — ABNORMAL LOW (ref 4.22–5.81)
RDW: 12.8 % (ref 11.5–15.5)
WBC: 5.6 10*3/uL (ref 4.0–10.5)
nRBC: 0 % (ref 0.0–0.2)
nRBC: 0 /100{WBCs}

## 2023-08-19 LAB — BASIC METABOLIC PANEL WITH GFR
Anion gap: 12 (ref 5–15)
BUN: 88 mg/dL — ABNORMAL HIGH (ref 6–20)
CO2: 20 mmol/L — ABNORMAL LOW (ref 22–32)
Calcium: 8.6 mg/dL — ABNORMAL LOW (ref 8.9–10.3)
Chloride: 99 mmol/L (ref 98–111)
Creatinine, Ser: 8.7 mg/dL — ABNORMAL HIGH (ref 0.61–1.24)
GFR, Estimated: 7 mL/min — ABNORMAL LOW (ref >60)
Glucose, Bld: 116 mg/dL — ABNORMAL HIGH (ref 70–99)
Potassium: 4.7 mmol/L (ref 3.5–5.1)
Sodium: 131 mmol/L — ABNORMAL LOW (ref 135–145)

## 2023-08-19 LAB — PHOSPHORUS: Phosphorus: 6.3 mg/dL — ABNORMAL HIGH (ref 2.5–4.6)

## 2023-08-19 LAB — MAGNESIUM: Magnesium: 2.1 mg/dL (ref 1.7–2.4)

## 2023-08-20 LAB — TACROLIMUS LEVEL: Tacrolimus (FK506) - LabCorp: 13.2 ng/mL (ref 5.0–20.0)

## 2023-08-21 ENCOUNTER — Other Ambulatory Visit (HOSPITAL_COMMUNITY)
Admission: RE | Admit: 2023-08-21 | Discharge: 2023-08-21 | Disposition: A | Discharge status: Home / Self Care | Source: Ambulatory Visit

## 2023-08-21 DIAGNOSIS — D899 Disorder involving the immune mechanism, unspecified: Secondary | ICD-10-CM | POA: Diagnosis present

## 2023-08-21 DIAGNOSIS — T8610 Unspecified complication of kidney transplant: Secondary | ICD-10-CM | POA: Insufficient documentation

## 2023-08-21 DIAGNOSIS — Z789 Other specified health status: Secondary | ICD-10-CM | POA: Diagnosis not present

## 2023-08-21 DIAGNOSIS — B259 Cytomegaloviral disease, unspecified: Secondary | ICD-10-CM | POA: Insufficient documentation

## 2023-08-21 DIAGNOSIS — Z79899 Other long term (current) drug therapy: Secondary | ICD-10-CM | POA: Insufficient documentation

## 2023-08-21 DIAGNOSIS — Z940 Kidney transplant status: Secondary | ICD-10-CM | POA: Insufficient documentation

## 2023-08-21 DIAGNOSIS — E1129 Type 2 diabetes mellitus with other diabetic kidney complication: Secondary | ICD-10-CM | POA: Diagnosis not present

## 2023-08-21 DIAGNOSIS — D631 Anemia in chronic kidney disease: Secondary | ICD-10-CM | POA: Diagnosis not present

## 2023-08-21 DIAGNOSIS — N390 Urinary tract infection, site not specified: Secondary | ICD-10-CM | POA: Diagnosis not present

## 2023-08-21 DIAGNOSIS — Z114 Encounter for screening for human immunodeficiency virus [HIV]: Secondary | ICD-10-CM | POA: Insufficient documentation

## 2023-08-21 DIAGNOSIS — Z9483 Pancreas transplant status: Secondary | ICD-10-CM | POA: Diagnosis not present

## 2023-08-21 DIAGNOSIS — E559 Vitamin D deficiency, unspecified: Secondary | ICD-10-CM | POA: Diagnosis present

## 2023-08-21 DIAGNOSIS — Z09 Encounter for follow-up examination after completed treatment for conditions other than malignant neoplasm: Secondary | ICD-10-CM | POA: Insufficient documentation

## 2023-08-21 LAB — BASIC METABOLIC PANEL WITH GFR
Anion gap: 13 (ref 5–15)
BUN: 69 mg/dL — ABNORMAL HIGH (ref 6–20)
CO2: 22 mmol/L (ref 22–32)
Calcium: 8.9 mg/dL (ref 8.9–10.3)
Chloride: 100 mmol/L (ref 98–111)
Creatinine, Ser: 8.39 mg/dL — ABNORMAL HIGH (ref 0.61–1.24)
GFR, Estimated: 7 mL/min — ABNORMAL LOW (ref >60)
Glucose, Bld: 113 mg/dL — ABNORMAL HIGH (ref 70–99)
Potassium: 4.4 mmol/L (ref 3.5–5.1)
Sodium: 135 mmol/L (ref 135–145)

## 2023-08-21 LAB — CBC WITH DIFFERENTIAL/PLATELET
Abs Immature Granulocytes: 0.28 10*3/uL — ABNORMAL HIGH (ref 0.00–0.07)
Basophils Absolute: 0 10*3/uL (ref 0.0–0.1)
Basophils Relative: 0 %
Eosinophils Absolute: 0 10*3/uL (ref 0.0–0.5)
Eosinophils Relative: 1 %
HCT: 29.2 % — ABNORMAL LOW (ref 39.0–52.0)
Hemoglobin: 9.7 g/dL — ABNORMAL LOW (ref 13.0–17.0)
Immature Granulocytes: 6 %
Lymphocytes Relative: 7 %
Lymphs Abs: 0.3 10*3/uL — ABNORMAL LOW (ref 0.7–4.0)
MCH: 31.6 pg (ref 26.0–34.0)
MCHC: 33.2 g/dL (ref 30.0–36.0)
MCV: 95.1 fL (ref 80.0–100.0)
Monocytes Absolute: 0.7 10*3/uL (ref 0.1–1.0)
Monocytes Relative: 15 %
Neutro Abs: 3.3 10*3/uL (ref 1.7–7.7)
Neutrophils Relative %: 71 %
Platelets: 152 10*3/uL (ref 150–400)
RBC: 3.07 MIL/uL — ABNORMAL LOW (ref 4.22–5.81)
RDW: 12.9 % (ref 11.5–15.5)
Smear Review: NORMAL
WBC: 4.6 10*3/uL (ref 4.0–10.5)
nRBC: 0 % (ref 0.0–0.2)

## 2023-08-21 LAB — PHOSPHORUS: Phosphorus: 6.6 mg/dL — ABNORMAL HIGH (ref 2.5–4.6)

## 2023-08-21 LAB — MAGNESIUM: Magnesium: 2 mg/dL (ref 1.7–2.4)

## 2023-08-22 LAB — TACROLIMUS LEVEL: Tacrolimus (FK506) - LabCorp: 20.6 ng/mL — ABNORMAL HIGH (ref 5.0–20.0)

## 2023-08-23 ENCOUNTER — Other Ambulatory Visit (HOSPITAL_COMMUNITY)
Admission: EM | Admit: 2023-08-23 | Discharge: 2023-08-23 | Disposition: A | Discharge status: Home / Self Care | Source: Ambulatory Visit

## 2023-08-23 DIAGNOSIS — Z114 Encounter for screening for human immunodeficiency virus [HIV]: Secondary | ICD-10-CM | POA: Diagnosis not present

## 2023-08-23 DIAGNOSIS — Z79899 Other long term (current) drug therapy: Secondary | ICD-10-CM | POA: Insufficient documentation

## 2023-08-23 DIAGNOSIS — Z789 Other specified health status: Secondary | ICD-10-CM | POA: Diagnosis not present

## 2023-08-23 DIAGNOSIS — Z09 Encounter for follow-up examination after completed treatment for conditions other than malignant neoplasm: Secondary | ICD-10-CM | POA: Insufficient documentation

## 2023-08-23 DIAGNOSIS — N189 Chronic kidney disease, unspecified: Secondary | ICD-10-CM | POA: Diagnosis not present

## 2023-08-23 DIAGNOSIS — E1129 Type 2 diabetes mellitus with other diabetic kidney complication: Secondary | ICD-10-CM | POA: Insufficient documentation

## 2023-08-23 DIAGNOSIS — D899 Disorder involving the immune mechanism, unspecified: Secondary | ICD-10-CM | POA: Diagnosis not present

## 2023-08-23 DIAGNOSIS — Z9483 Pancreas transplant status: Secondary | ICD-10-CM | POA: Diagnosis not present

## 2023-08-23 DIAGNOSIS — N390 Urinary tract infection, site not specified: Secondary | ICD-10-CM | POA: Diagnosis not present

## 2023-08-23 DIAGNOSIS — E559 Vitamin D deficiency, unspecified: Secondary | ICD-10-CM | POA: Insufficient documentation

## 2023-08-23 DIAGNOSIS — B9689 Other specified bacterial agents as the cause of diseases classified elsewhere: Secondary | ICD-10-CM | POA: Insufficient documentation

## 2023-08-23 DIAGNOSIS — D631 Anemia in chronic kidney disease: Secondary | ICD-10-CM | POA: Insufficient documentation

## 2023-08-23 DIAGNOSIS — Z940 Kidney transplant status: Secondary | ICD-10-CM | POA: Insufficient documentation

## 2023-08-23 DIAGNOSIS — B259 Cytomegaloviral disease, unspecified: Secondary | ICD-10-CM | POA: Diagnosis not present

## 2023-08-23 LAB — CBC WITH DIFFERENTIAL/PLATELET
Abs Immature Granulocytes: 0.07 10*3/uL (ref 0.00–0.07)
Basophils Absolute: 0 10*3/uL (ref 0.0–0.1)
Basophils Relative: 0 %
Eosinophils Absolute: 0.1 10*3/uL (ref 0.0–0.5)
Eosinophils Relative: 2 %
HCT: 30.8 % — ABNORMAL LOW (ref 39.0–52.0)
Hemoglobin: 10.3 g/dL — ABNORMAL LOW (ref 13.0–17.0)
Immature Granulocytes: 2 %
Lymphocytes Relative: 6 %
Lymphs Abs: 0.3 10*3/uL — ABNORMAL LOW (ref 0.7–4.0)
MCH: 31.5 pg (ref 26.0–34.0)
MCHC: 33.4 g/dL (ref 30.0–36.0)
MCV: 94.2 fL (ref 80.0–100.0)
Monocytes Absolute: 0.6 10*3/uL (ref 0.1–1.0)
Monocytes Relative: 13 %
Neutro Abs: 3.7 10*3/uL (ref 1.7–7.7)
Neutrophils Relative %: 77 %
Platelets: 174 10*3/uL (ref 150–400)
RBC: 3.27 MIL/uL — ABNORMAL LOW (ref 4.22–5.81)
RDW: 12.8 % (ref 11.5–15.5)
WBC: 4.8 10*3/uL (ref 4.0–10.5)
nRBC: 0 % (ref 0.0–0.2)

## 2023-08-23 LAB — BASIC METABOLIC PANEL WITH GFR
Anion gap: 14 (ref 5–15)
BUN: 49 mg/dL — ABNORMAL HIGH (ref 6–20)
CO2: 22 mmol/L (ref 22–32)
Calcium: 9.2 mg/dL (ref 8.9–10.3)
Chloride: 101 mmol/L (ref 98–111)
Creatinine, Ser: 7.62 mg/dL — ABNORMAL HIGH (ref 0.61–1.24)
GFR, Estimated: 8 mL/min — ABNORMAL LOW (ref >60)
Glucose, Bld: 117 mg/dL — ABNORMAL HIGH (ref 70–99)
Potassium: 4 mmol/L (ref 3.5–5.1)
Sodium: 137 mmol/L (ref 135–145)

## 2023-08-23 LAB — MAGNESIUM: Magnesium: 2 mg/dL (ref 1.7–2.4)

## 2023-08-23 LAB — PHOSPHORUS: Phosphorus: 6.9 mg/dL — ABNORMAL HIGH (ref 2.5–4.6)

## 2023-08-25 LAB — TACROLIMUS LEVEL: Tacrolimus (FK506) - LabCorp: 15.4 ng/mL (ref 5.0–20.0)

## 2023-08-26 ENCOUNTER — Other Ambulatory Visit (HOSPITAL_COMMUNITY)
Admission: EM | Admit: 2023-08-26 | Discharge: 2023-08-26 | Disposition: A | Discharge status: Home / Self Care | Source: Ambulatory Visit

## 2023-08-26 DIAGNOSIS — N189 Chronic kidney disease, unspecified: Secondary | ICD-10-CM | POA: Insufficient documentation

## 2023-08-26 DIAGNOSIS — D899 Disorder involving the immune mechanism, unspecified: Secondary | ICD-10-CM | POA: Diagnosis not present

## 2023-08-26 DIAGNOSIS — Z940 Kidney transplant status: Secondary | ICD-10-CM | POA: Insufficient documentation

## 2023-08-26 DIAGNOSIS — Z789 Other specified health status: Secondary | ICD-10-CM | POA: Insufficient documentation

## 2023-08-26 DIAGNOSIS — N390 Urinary tract infection, site not specified: Secondary | ICD-10-CM | POA: Insufficient documentation

## 2023-08-26 DIAGNOSIS — E1129 Type 2 diabetes mellitus with other diabetic kidney complication: Secondary | ICD-10-CM | POA: Diagnosis not present

## 2023-08-26 DIAGNOSIS — Z79899 Other long term (current) drug therapy: Secondary | ICD-10-CM | POA: Insufficient documentation

## 2023-08-26 DIAGNOSIS — B9689 Other specified bacterial agents as the cause of diseases classified elsewhere: Secondary | ICD-10-CM | POA: Diagnosis not present

## 2023-08-26 DIAGNOSIS — B259 Cytomegaloviral disease, unspecified: Secondary | ICD-10-CM | POA: Insufficient documentation

## 2023-08-26 DIAGNOSIS — D631 Anemia in chronic kidney disease: Secondary | ICD-10-CM | POA: Insufficient documentation

## 2023-08-26 DIAGNOSIS — Z09 Encounter for follow-up examination after completed treatment for conditions other than malignant neoplasm: Secondary | ICD-10-CM | POA: Insufficient documentation

## 2023-08-26 DIAGNOSIS — Z114 Encounter for screening for human immunodeficiency virus [HIV]: Secondary | ICD-10-CM | POA: Diagnosis not present

## 2023-08-26 DIAGNOSIS — Z9483 Pancreas transplant status: Secondary | ICD-10-CM | POA: Diagnosis not present

## 2023-08-26 DIAGNOSIS — E559 Vitamin D deficiency, unspecified: Secondary | ICD-10-CM | POA: Insufficient documentation

## 2023-08-26 LAB — CBC WITH DIFFERENTIAL/PLATELET
Abs Immature Granulocytes: 0.02 10*3/uL (ref 0.00–0.07)
Basophils Absolute: 0 10*3/uL (ref 0.0–0.1)
Basophils Relative: 1 %
Eosinophils Absolute: 0.1 10*3/uL (ref 0.0–0.5)
Eosinophils Relative: 2 %
HCT: 30.8 % — ABNORMAL LOW (ref 39.0–52.0)
Hemoglobin: 10.3 g/dL — ABNORMAL LOW (ref 13.0–17.0)
Immature Granulocytes: 1 %
Lymphocytes Relative: 6 %
Lymphs Abs: 0.2 10*3/uL — ABNORMAL LOW (ref 0.7–4.0)
MCH: 31.6 pg (ref 26.0–34.0)
MCHC: 33.4 g/dL (ref 30.0–36.0)
MCV: 94.5 fL (ref 80.0–100.0)
Monocytes Absolute: 0.5 10*3/uL (ref 0.1–1.0)
Monocytes Relative: 11 %
Neutro Abs: 3.4 10*3/uL (ref 1.7–7.7)
Neutrophils Relative %: 79 %
Platelets: 186 10*3/uL (ref 150–400)
RBC: 3.26 MIL/uL — ABNORMAL LOW (ref 4.22–5.81)
RDW: 12.6 % (ref 11.5–15.5)
WBC: 4.3 10*3/uL (ref 4.0–10.5)
nRBC: 0 % (ref 0.0–0.2)

## 2023-08-26 LAB — BASIC METABOLIC PANEL WITH GFR
Anion gap: 14 (ref 5–15)
BUN: 43 mg/dL — ABNORMAL HIGH (ref 6–20)
CO2: 19 mmol/L — ABNORMAL LOW (ref 22–32)
Calcium: 9.2 mg/dL (ref 8.9–10.3)
Chloride: 104 mmol/L (ref 98–111)
Creatinine, Ser: 7.12 mg/dL — ABNORMAL HIGH (ref 0.61–1.24)
GFR, Estimated: 8 mL/min — ABNORMAL LOW (ref >60)
Glucose, Bld: 138 mg/dL — ABNORMAL HIGH (ref 70–99)
Potassium: 3.8 mmol/L (ref 3.5–5.1)
Sodium: 137 mmol/L (ref 135–145)

## 2023-08-26 LAB — PHOSPHORUS: Phosphorus: 5.6 mg/dL — ABNORMAL HIGH (ref 2.5–4.6)

## 2023-08-26 LAB — MAGNESIUM: Magnesium: 1.8 mg/dL (ref 1.7–2.4)

## 2023-08-28 ENCOUNTER — Other Ambulatory Visit (HOSPITAL_COMMUNITY)
Admission: RE | Admit: 2023-08-28 | Discharge: 2023-08-28 | Disposition: A | Discharge status: Home / Self Care | Source: Ambulatory Visit | Attending: Nephrology | Admitting: Nephrology

## 2023-08-28 DIAGNOSIS — Z789 Other specified health status: Secondary | ICD-10-CM | POA: Diagnosis not present

## 2023-08-28 DIAGNOSIS — E1129 Type 2 diabetes mellitus with other diabetic kidney complication: Secondary | ICD-10-CM | POA: Insufficient documentation

## 2023-08-28 DIAGNOSIS — B259 Cytomegaloviral disease, unspecified: Secondary | ICD-10-CM | POA: Insufficient documentation

## 2023-08-28 DIAGNOSIS — Z09 Encounter for follow-up examination after completed treatment for conditions other than malignant neoplasm: Secondary | ICD-10-CM | POA: Diagnosis not present

## 2023-08-28 DIAGNOSIS — Z940 Kidney transplant status: Secondary | ICD-10-CM | POA: Insufficient documentation

## 2023-08-28 DIAGNOSIS — Z114 Encounter for screening for human immunodeficiency virus [HIV]: Secondary | ICD-10-CM | POA: Diagnosis not present

## 2023-08-28 DIAGNOSIS — D899 Disorder involving the immune mechanism, unspecified: Secondary | ICD-10-CM | POA: Insufficient documentation

## 2023-08-28 DIAGNOSIS — N390 Urinary tract infection, site not specified: Secondary | ICD-10-CM | POA: Insufficient documentation

## 2023-08-28 DIAGNOSIS — T8610 Unspecified complication of kidney transplant: Secondary | ICD-10-CM | POA: Insufficient documentation

## 2023-08-28 DIAGNOSIS — Z9483 Pancreas transplant status: Secondary | ICD-10-CM | POA: Diagnosis not present

## 2023-08-28 DIAGNOSIS — Z79899 Other long term (current) drug therapy: Secondary | ICD-10-CM | POA: Insufficient documentation

## 2023-08-28 DIAGNOSIS — E559 Vitamin D deficiency, unspecified: Secondary | ICD-10-CM | POA: Insufficient documentation

## 2023-08-28 DIAGNOSIS — D631 Anemia in chronic kidney disease: Secondary | ICD-10-CM | POA: Diagnosis not present

## 2023-08-28 LAB — CBC WITH DIFFERENTIAL/PLATELET
Abs Immature Granulocytes: 0.02 10*3/uL (ref 0.00–0.07)
Basophils Absolute: 0 10*3/uL (ref 0.0–0.1)
Basophils Relative: 1 %
Eosinophils Absolute: 0 10*3/uL (ref 0.0–0.5)
Eosinophils Relative: 1 %
HCT: 32.5 % — ABNORMAL LOW (ref 39.0–52.0)
Hemoglobin: 11 g/dL — ABNORMAL LOW (ref 13.0–17.0)
Immature Granulocytes: 1 %
Lymphocytes Relative: 6 %
Lymphs Abs: 0.2 10*3/uL — ABNORMAL LOW (ref 0.7–4.0)
MCH: 31.7 pg (ref 26.0–34.0)
MCHC: 33.8 g/dL (ref 30.0–36.0)
MCV: 93.7 fL (ref 80.0–100.0)
Monocytes Absolute: 0.5 10*3/uL (ref 0.1–1.0)
Monocytes Relative: 12 %
Neutro Abs: 3.5 10*3/uL (ref 1.7–7.7)
Neutrophils Relative %: 79 %
Platelets: 210 10*3/uL (ref 150–400)
RBC: 3.47 MIL/uL — ABNORMAL LOW (ref 4.22–5.81)
RDW: 12.6 % (ref 11.5–15.5)
WBC: 4.3 10*3/uL (ref 4.0–10.5)
nRBC: 0 % (ref 0.0–0.2)

## 2023-08-28 LAB — BASIC METABOLIC PANEL WITH GFR
Anion gap: 15 (ref 5–15)
BUN: 46 mg/dL — ABNORMAL HIGH (ref 6–20)
CO2: 24 mmol/L (ref 22–32)
Calcium: 9.6 mg/dL (ref 8.9–10.3)
Chloride: 97 mmol/L — ABNORMAL LOW (ref 98–111)
Creatinine, Ser: 6.73 mg/dL — ABNORMAL HIGH (ref 0.61–1.24)
GFR, Estimated: 9 mL/min — ABNORMAL LOW (ref >60)
Glucose, Bld: 126 mg/dL — ABNORMAL HIGH (ref 70–99)
Potassium: 3.8 mmol/L (ref 3.5–5.1)
Sodium: 136 mmol/L (ref 135–145)

## 2023-08-28 LAB — PHOSPHORUS: Phosphorus: 5.7 mg/dL — ABNORMAL HIGH (ref 2.5–4.6)

## 2023-08-28 LAB — TACROLIMUS LEVEL: Tacrolimus (FK506) - LabCorp: 12.3 ng/mL (ref 5.0–20.0)

## 2023-08-28 LAB — MAGNESIUM: Magnesium: 1.8 mg/dL (ref 1.7–2.4)

## 2023-08-29 LAB — TACROLIMUS LEVEL: Tacrolimus (FK506) - LabCorp: 12.6 ng/mL (ref 5.0–20.0)

## 2023-09-02 ENCOUNTER — Other Ambulatory Visit (HOSPITAL_COMMUNITY)
Admission: AD | Admit: 2023-09-02 | Discharge: 2023-09-02 | Disposition: A | Discharge status: Home / Self Care | Source: Ambulatory Visit

## 2023-09-02 DIAGNOSIS — D631 Anemia in chronic kidney disease: Secondary | ICD-10-CM | POA: Diagnosis not present

## 2023-09-02 DIAGNOSIS — Z79899 Other long term (current) drug therapy: Secondary | ICD-10-CM | POA: Diagnosis not present

## 2023-09-02 DIAGNOSIS — Z114 Encounter for screening for human immunodeficiency virus [HIV]: Secondary | ICD-10-CM | POA: Insufficient documentation

## 2023-09-02 DIAGNOSIS — E1129 Type 2 diabetes mellitus with other diabetic kidney complication: Secondary | ICD-10-CM | POA: Diagnosis not present

## 2023-09-02 DIAGNOSIS — B259 Cytomegaloviral disease, unspecified: Secondary | ICD-10-CM | POA: Diagnosis not present

## 2023-09-02 DIAGNOSIS — N390 Urinary tract infection, site not specified: Secondary | ICD-10-CM | POA: Diagnosis not present

## 2023-09-02 DIAGNOSIS — N189 Chronic kidney disease, unspecified: Secondary | ICD-10-CM | POA: Insufficient documentation

## 2023-09-02 DIAGNOSIS — B9689 Other specified bacterial agents as the cause of diseases classified elsewhere: Secondary | ICD-10-CM | POA: Insufficient documentation

## 2023-09-02 DIAGNOSIS — Z789 Other specified health status: Secondary | ICD-10-CM | POA: Insufficient documentation

## 2023-09-02 DIAGNOSIS — Z9483 Pancreas transplant status: Secondary | ICD-10-CM | POA: Insufficient documentation

## 2023-09-02 DIAGNOSIS — Z09 Encounter for follow-up examination after completed treatment for conditions other than malignant neoplasm: Secondary | ICD-10-CM | POA: Insufficient documentation

## 2023-09-02 DIAGNOSIS — E559 Vitamin D deficiency, unspecified: Secondary | ICD-10-CM | POA: Insufficient documentation

## 2023-09-02 DIAGNOSIS — Z940 Kidney transplant status: Secondary | ICD-10-CM | POA: Insufficient documentation

## 2023-09-02 DIAGNOSIS — D899 Disorder involving the immune mechanism, unspecified: Secondary | ICD-10-CM | POA: Diagnosis not present

## 2023-09-02 LAB — CBC WITH DIFFERENTIAL/PLATELET
Abs Immature Granulocytes: 0.01 K/uL (ref 0.00–0.07)
Basophils Absolute: 0 K/uL (ref 0.0–0.1)
Basophils Relative: 1 %
Eosinophils Absolute: 0.1 K/uL (ref 0.0–0.5)
Eosinophils Relative: 3 %
HCT: 32.9 % — ABNORMAL LOW (ref 39.0–52.0)
Hemoglobin: 10.8 g/dL — ABNORMAL LOW (ref 13.0–17.0)
Immature Granulocytes: 1 %
Lymphocytes Relative: 14 %
Lymphs Abs: 0.3 K/uL — ABNORMAL LOW (ref 0.7–4.0)
MCH: 31.1 pg (ref 26.0–34.0)
MCHC: 32.8 g/dL (ref 30.0–36.0)
MCV: 94.8 fL (ref 80.0–100.0)
Monocytes Absolute: 0.3 K/uL (ref 0.1–1.0)
Monocytes Relative: 12 %
Neutro Abs: 1.4 K/uL — ABNORMAL LOW (ref 1.7–7.7)
Neutrophils Relative %: 69 %
Platelets: 258 K/uL (ref 150–400)
RBC: 3.47 MIL/uL — ABNORMAL LOW (ref 4.22–5.81)
RDW: 12.9 % (ref 11.5–15.5)
WBC: 2 K/uL — ABNORMAL LOW (ref 4.0–10.5)
nRBC: 0 % (ref 0.0–0.2)

## 2023-09-02 LAB — BASIC METABOLIC PANEL WITH GFR
Anion gap: 11 (ref 5–15)
BUN: 51 mg/dL — ABNORMAL HIGH (ref 6–20)
CO2: 21 mmol/L — ABNORMAL LOW (ref 22–32)
Calcium: 9.7 mg/dL (ref 8.9–10.3)
Chloride: 107 mmol/L (ref 98–111)
Creatinine, Ser: 5.18 mg/dL — ABNORMAL HIGH (ref 0.61–1.24)
GFR, Estimated: 12 mL/min — ABNORMAL LOW (ref >60)
Glucose, Bld: 136 mg/dL — ABNORMAL HIGH (ref 70–99)
Potassium: 3.8 mmol/L (ref 3.5–5.1)
Sodium: 139 mmol/L (ref 135–145)

## 2023-09-02 LAB — MAGNESIUM: Magnesium: 1.5 mg/dL — ABNORMAL LOW (ref 1.7–2.4)

## 2023-09-02 LAB — PHOSPHORUS: Phosphorus: 3.8 mg/dL (ref 2.5–4.6)

## 2023-09-03 LAB — TACROLIMUS LEVEL: Tacrolimus (FK506) - LabCorp: 13.7 ng/mL (ref 5.0–20.0)

## 2023-09-04 ENCOUNTER — Other Ambulatory Visit (HOSPITAL_COMMUNITY)
Admission: EM | Admit: 2023-09-04 | Discharge: 2023-09-04 | Disposition: A | Discharge status: Home / Self Care | Source: Ambulatory Visit | Attending: Nephrology | Admitting: Nephrology

## 2023-09-04 DIAGNOSIS — T8610 Unspecified complication of kidney transplant: Secondary | ICD-10-CM | POA: Diagnosis not present

## 2023-09-04 DIAGNOSIS — N390 Urinary tract infection, site not specified: Secondary | ICD-10-CM | POA: Diagnosis not present

## 2023-09-04 DIAGNOSIS — N189 Chronic kidney disease, unspecified: Secondary | ICD-10-CM | POA: Insufficient documentation

## 2023-09-04 DIAGNOSIS — E1122 Type 2 diabetes mellitus with diabetic chronic kidney disease: Secondary | ICD-10-CM | POA: Diagnosis not present

## 2023-09-04 DIAGNOSIS — D631 Anemia in chronic kidney disease: Secondary | ICD-10-CM | POA: Diagnosis not present

## 2023-09-04 DIAGNOSIS — E559 Vitamin D deficiency, unspecified: Secondary | ICD-10-CM | POA: Diagnosis present

## 2023-09-04 DIAGNOSIS — Z9483 Pancreas transplant status: Secondary | ICD-10-CM | POA: Insufficient documentation

## 2023-09-04 DIAGNOSIS — Z114 Encounter for screening for human immunodeficiency virus [HIV]: Secondary | ICD-10-CM | POA: Diagnosis not present

## 2023-09-04 DIAGNOSIS — E1129 Type 2 diabetes mellitus with other diabetic kidney complication: Secondary | ICD-10-CM | POA: Insufficient documentation

## 2023-09-04 DIAGNOSIS — Z940 Kidney transplant status: Secondary | ICD-10-CM | POA: Insufficient documentation

## 2023-09-04 DIAGNOSIS — Z79899 Other long term (current) drug therapy: Secondary | ICD-10-CM | POA: Diagnosis not present

## 2023-09-04 DIAGNOSIS — Z09 Encounter for follow-up examination after completed treatment for conditions other than malignant neoplasm: Secondary | ICD-10-CM | POA: Insufficient documentation

## 2023-09-04 DIAGNOSIS — D899 Disorder involving the immune mechanism, unspecified: Secondary | ICD-10-CM | POA: Insufficient documentation

## 2023-09-04 DIAGNOSIS — B259 Cytomegaloviral disease, unspecified: Secondary | ICD-10-CM | POA: Diagnosis not present

## 2023-09-04 DIAGNOSIS — Z789 Other specified health status: Secondary | ICD-10-CM | POA: Diagnosis not present

## 2023-09-04 LAB — BASIC METABOLIC PANEL WITH GFR
Anion gap: 11 (ref 5–15)
BUN: 48 mg/dL — ABNORMAL HIGH (ref 6–20)
CO2: 21 mmol/L — ABNORMAL LOW (ref 22–32)
Calcium: 9.7 mg/dL (ref 8.9–10.3)
Chloride: 109 mmol/L (ref 98–111)
Creatinine, Ser: 4.83 mg/dL — ABNORMAL HIGH (ref 0.61–1.24)
GFR, Estimated: 13 mL/min — ABNORMAL LOW (ref >60)
Glucose, Bld: 136 mg/dL — ABNORMAL HIGH (ref 70–99)
Potassium: 4.5 mmol/L (ref 3.5–5.1)
Sodium: 141 mmol/L (ref 135–145)

## 2023-09-04 LAB — CBC WITH DIFFERENTIAL/PLATELET
Abs Immature Granulocytes: 0.02 10*3/uL (ref 0.00–0.07)
Basophils Absolute: 0 10*3/uL (ref 0.0–0.1)
Basophils Relative: 1 %
Eosinophils Absolute: 0.1 10*3/uL (ref 0.0–0.5)
Eosinophils Relative: 3 %
HCT: 34.4 % — ABNORMAL LOW (ref 39.0–52.0)
Hemoglobin: 11.1 g/dL — ABNORMAL LOW (ref 13.0–17.0)
Immature Granulocytes: 1 %
Lymphocytes Relative: 13 %
Lymphs Abs: 0.3 10*3/uL — ABNORMAL LOW (ref 0.7–4.0)
MCH: 31.1 pg (ref 26.0–34.0)
MCHC: 32.3 g/dL (ref 30.0–36.0)
MCV: 96.4 fL (ref 80.0–100.0)
Monocytes Absolute: 0.4 10*3/uL (ref 0.1–1.0)
Monocytes Relative: 18 %
Neutro Abs: 1.5 10*3/uL — ABNORMAL LOW (ref 1.7–7.7)
Neutrophils Relative %: 64 %
Platelets: 266 10*3/uL (ref 150–400)
RBC: 3.57 MIL/uL — ABNORMAL LOW (ref 4.22–5.81)
RDW: 12.9 % (ref 11.5–15.5)
WBC: 2.4 10*3/uL — ABNORMAL LOW (ref 4.0–10.5)
nRBC: 0 % (ref 0.0–0.2)

## 2023-09-04 LAB — PHOSPHORUS: Phosphorus: 3.3 mg/dL (ref 2.5–4.6)

## 2023-09-04 LAB — MAGNESIUM: Magnesium: 1.5 mg/dL — ABNORMAL LOW (ref 1.7–2.4)

## 2023-09-06 ENCOUNTER — Other Ambulatory Visit (HOSPITAL_COMMUNITY)
Admission: EM | Admit: 2023-09-06 | Discharge: 2023-09-06 | Disposition: A | Discharge status: Home / Self Care | Source: Ambulatory Visit

## 2023-09-06 DIAGNOSIS — B259 Cytomegaloviral disease, unspecified: Secondary | ICD-10-CM | POA: Diagnosis not present

## 2023-09-06 DIAGNOSIS — N390 Urinary tract infection, site not specified: Secondary | ICD-10-CM | POA: Diagnosis not present

## 2023-09-06 DIAGNOSIS — Z79899 Other long term (current) drug therapy: Secondary | ICD-10-CM | POA: Diagnosis not present

## 2023-09-06 DIAGNOSIS — T8610 Unspecified complication of kidney transplant: Secondary | ICD-10-CM | POA: Diagnosis not present

## 2023-09-06 DIAGNOSIS — Z9483 Pancreas transplant status: Secondary | ICD-10-CM | POA: Insufficient documentation

## 2023-09-06 DIAGNOSIS — D899 Disorder involving the immune mechanism, unspecified: Secondary | ICD-10-CM | POA: Insufficient documentation

## 2023-09-06 DIAGNOSIS — Z114 Encounter for screening for human immunodeficiency virus [HIV]: Secondary | ICD-10-CM | POA: Diagnosis not present

## 2023-09-06 DIAGNOSIS — D631 Anemia in chronic kidney disease: Secondary | ICD-10-CM | POA: Insufficient documentation

## 2023-09-06 DIAGNOSIS — Z09 Encounter for follow-up examination after completed treatment for conditions other than malignant neoplasm: Secondary | ICD-10-CM | POA: Diagnosis not present

## 2023-09-06 DIAGNOSIS — E1129 Type 2 diabetes mellitus with other diabetic kidney complication: Secondary | ICD-10-CM | POA: Insufficient documentation

## 2023-09-06 DIAGNOSIS — Z940 Kidney transplant status: Secondary | ICD-10-CM | POA: Diagnosis not present

## 2023-09-06 DIAGNOSIS — E559 Vitamin D deficiency, unspecified: Secondary | ICD-10-CM | POA: Insufficient documentation

## 2023-09-06 DIAGNOSIS — Z789 Other specified health status: Secondary | ICD-10-CM | POA: Diagnosis not present

## 2023-09-06 LAB — BASIC METABOLIC PANEL WITH GFR
Anion gap: 13 (ref 5–15)
BUN: 47 mg/dL — ABNORMAL HIGH (ref 6–20)
CO2: 19 mmol/L — ABNORMAL LOW (ref 22–32)
Calcium: 9.9 mg/dL (ref 8.9–10.3)
Chloride: 106 mmol/L (ref 98–111)
Creatinine, Ser: 4.24 mg/dL — ABNORMAL HIGH (ref 0.61–1.24)
GFR, Estimated: 16 mL/min — ABNORMAL LOW (ref >60)
Glucose, Bld: 121 mg/dL — ABNORMAL HIGH (ref 70–99)
Potassium: 4 mmol/L (ref 3.5–5.1)
Sodium: 138 mmol/L (ref 135–145)

## 2023-09-06 LAB — PHOSPHORUS: Phosphorus: 3.3 mg/dL (ref 2.5–4.6)

## 2023-09-06 LAB — CBC WITH DIFFERENTIAL/PLATELET
Abs Immature Granulocytes: 0.01 10*3/uL (ref 0.00–0.07)
Basophils Absolute: 0 10*3/uL (ref 0.0–0.1)
Basophils Relative: 0 %
Eosinophils Absolute: 0 10*3/uL (ref 0.0–0.5)
Eosinophils Relative: 2 %
HCT: 33 % — ABNORMAL LOW (ref 39.0–52.0)
Hemoglobin: 10.8 g/dL — ABNORMAL LOW (ref 13.0–17.0)
Immature Granulocytes: 0 %
Lymphocytes Relative: 14 %
Lymphs Abs: 0.4 10*3/uL — ABNORMAL LOW (ref 0.7–4.0)
MCH: 31.3 pg (ref 26.0–34.0)
MCHC: 32.7 g/dL (ref 30.0–36.0)
MCV: 95.7 fL (ref 80.0–100.0)
Monocytes Absolute: 0.5 10*3/uL (ref 0.1–1.0)
Monocytes Relative: 20 %
Neutro Abs: 1.7 10*3/uL (ref 1.7–7.7)
Neutrophils Relative %: 64 %
Platelets: 257 10*3/uL (ref 150–400)
RBC: 3.45 MIL/uL — ABNORMAL LOW (ref 4.22–5.81)
RDW: 13.2 % (ref 11.5–15.5)
WBC: 2.6 10*3/uL — ABNORMAL LOW (ref 4.0–10.5)
nRBC: 0 % (ref 0.0–0.2)

## 2023-09-06 LAB — TACROLIMUS LEVEL: Tacrolimus (FK506) - LabCorp: 15.8 ng/mL (ref 5.0–20.0)

## 2023-09-06 LAB — MAGNESIUM: Magnesium: 1.4 mg/dL — ABNORMAL LOW (ref 1.7–2.4)

## 2023-09-08 LAB — TACROLIMUS LEVEL: Tacrolimus (FK506) - LabCorp: 13.1 ng/mL (ref 5.0–20.0)

## 2023-09-09 ENCOUNTER — Other Ambulatory Visit (HOSPITAL_COMMUNITY)
Admission: EM | Admit: 2023-09-09 | Discharge: 2023-09-09 | Disposition: A | Discharge status: Home / Self Care | Source: Ambulatory Visit

## 2023-09-09 DIAGNOSIS — T8610 Unspecified complication of kidney transplant: Secondary | ICD-10-CM | POA: Diagnosis not present

## 2023-09-09 DIAGNOSIS — D631 Anemia in chronic kidney disease: Secondary | ICD-10-CM | POA: Insufficient documentation

## 2023-09-09 DIAGNOSIS — Z79899 Other long term (current) drug therapy: Secondary | ICD-10-CM | POA: Diagnosis not present

## 2023-09-09 DIAGNOSIS — Z9483 Pancreas transplant status: Secondary | ICD-10-CM | POA: Insufficient documentation

## 2023-09-09 DIAGNOSIS — N390 Urinary tract infection, site not specified: Secondary | ICD-10-CM | POA: Diagnosis not present

## 2023-09-09 DIAGNOSIS — D899 Disorder involving the immune mechanism, unspecified: Secondary | ICD-10-CM | POA: Insufficient documentation

## 2023-09-09 DIAGNOSIS — E1129 Type 2 diabetes mellitus with other diabetic kidney complication: Secondary | ICD-10-CM | POA: Insufficient documentation

## 2023-09-09 DIAGNOSIS — B259 Cytomegaloviral disease, unspecified: Secondary | ICD-10-CM | POA: Diagnosis not present

## 2023-09-09 DIAGNOSIS — Z789 Other specified health status: Secondary | ICD-10-CM | POA: Diagnosis not present

## 2023-09-09 DIAGNOSIS — Z114 Encounter for screening for human immunodeficiency virus [HIV]: Secondary | ICD-10-CM | POA: Insufficient documentation

## 2023-09-09 DIAGNOSIS — Z940 Kidney transplant status: Secondary | ICD-10-CM | POA: Insufficient documentation

## 2023-09-09 DIAGNOSIS — Z09 Encounter for follow-up examination after completed treatment for conditions other than malignant neoplasm: Secondary | ICD-10-CM | POA: Insufficient documentation

## 2023-09-09 LAB — BASIC METABOLIC PANEL WITH GFR
Anion gap: 10 (ref 5–15)
BUN: 35 mg/dL — ABNORMAL HIGH (ref 6–20)
CO2: 22 mmol/L (ref 22–32)
Calcium: 9.8 mg/dL (ref 8.9–10.3)
Chloride: 107 mmol/L (ref 98–111)
Creatinine, Ser: 4.1 mg/dL — ABNORMAL HIGH (ref 0.61–1.24)
GFR, Estimated: 16 mL/min — ABNORMAL LOW (ref >60)
Glucose, Bld: 118 mg/dL — ABNORMAL HIGH (ref 70–99)
Potassium: 4.3 mmol/L (ref 3.5–5.1)
Sodium: 139 mmol/L (ref 135–145)

## 2023-09-09 LAB — CBC WITH DIFFERENTIAL/PLATELET
Abs Immature Granulocytes: 0.02 10*3/uL (ref 0.00–0.07)
Basophils Absolute: 0 10*3/uL (ref 0.0–0.1)
Basophils Relative: 1 %
Eosinophils Absolute: 0 10*3/uL (ref 0.0–0.5)
Eosinophils Relative: 2 %
HCT: 33.4 % — ABNORMAL LOW (ref 39.0–52.0)
Hemoglobin: 10.9 g/dL — ABNORMAL LOW (ref 13.0–17.0)
Immature Granulocytes: 1 %
Lymphocytes Relative: 11 %
Lymphs Abs: 0.3 10*3/uL — ABNORMAL LOW (ref 0.7–4.0)
MCH: 31 pg (ref 26.0–34.0)
MCHC: 32.6 g/dL (ref 30.0–36.0)
MCV: 94.9 fL (ref 80.0–100.0)
Monocytes Absolute: 0.7 10*3/uL (ref 0.1–1.0)
Monocytes Relative: 25 %
Neutro Abs: 1.7 10*3/uL (ref 1.7–7.7)
Neutrophils Relative %: 60 %
Platelets: 248 10*3/uL (ref 150–400)
RBC: 3.52 MIL/uL — ABNORMAL LOW (ref 4.22–5.81)
RDW: 13.2 % (ref 11.5–15.5)
WBC: 2.7 10*3/uL — ABNORMAL LOW (ref 4.0–10.5)
nRBC: 0 % (ref 0.0–0.2)

## 2023-09-09 LAB — TSH: TSH: 1.261 u[IU]/mL (ref 0.350–4.500)

## 2023-09-11 ENCOUNTER — Other Ambulatory Visit (HOSPITAL_COMMUNITY)
Admission: EM | Admit: 2023-09-11 | Discharge: 2023-09-11 | Disposition: A | Discharge status: Home / Self Care | Source: Ambulatory Visit | Attending: *Deleted | Admitting: *Deleted

## 2023-09-11 DIAGNOSIS — B259 Cytomegaloviral disease, unspecified: Secondary | ICD-10-CM | POA: Diagnosis not present

## 2023-09-11 DIAGNOSIS — Z9483 Pancreas transplant status: Secondary | ICD-10-CM | POA: Diagnosis not present

## 2023-09-11 DIAGNOSIS — Z940 Kidney transplant status: Secondary | ICD-10-CM | POA: Insufficient documentation

## 2023-09-11 DIAGNOSIS — Z09 Encounter for follow-up examination after completed treatment for conditions other than malignant neoplasm: Secondary | ICD-10-CM | POA: Diagnosis not present

## 2023-09-11 DIAGNOSIS — Z114 Encounter for screening for human immunodeficiency virus [HIV]: Secondary | ICD-10-CM | POA: Insufficient documentation

## 2023-09-11 DIAGNOSIS — D899 Disorder involving the immune mechanism, unspecified: Secondary | ICD-10-CM | POA: Diagnosis not present

## 2023-09-11 DIAGNOSIS — E1129 Type 2 diabetes mellitus with other diabetic kidney complication: Secondary | ICD-10-CM | POA: Diagnosis not present

## 2023-09-11 DIAGNOSIS — Z789 Other specified health status: Secondary | ICD-10-CM | POA: Insufficient documentation

## 2023-09-11 DIAGNOSIS — N390 Urinary tract infection, site not specified: Secondary | ICD-10-CM | POA: Diagnosis not present

## 2023-09-11 DIAGNOSIS — D631 Anemia in chronic kidney disease: Secondary | ICD-10-CM | POA: Diagnosis not present

## 2023-09-11 DIAGNOSIS — Z79899 Other long term (current) drug therapy: Secondary | ICD-10-CM | POA: Diagnosis not present

## 2023-09-11 DIAGNOSIS — E559 Vitamin D deficiency, unspecified: Secondary | ICD-10-CM | POA: Diagnosis not present

## 2023-09-11 LAB — BASIC METABOLIC PANEL WITH GFR
Anion gap: 10 (ref 5–15)
BUN: 34 mg/dL — ABNORMAL HIGH (ref 6–20)
CO2: 21 mmol/L — ABNORMAL LOW (ref 22–32)
Calcium: 9.4 mg/dL (ref 8.9–10.3)
Chloride: 106 mmol/L (ref 98–111)
Creatinine, Ser: 3.86 mg/dL — ABNORMAL HIGH (ref 0.61–1.24)
GFR, Estimated: 17 mL/min — ABNORMAL LOW (ref >60)
Glucose, Bld: 118 mg/dL — ABNORMAL HIGH (ref 70–99)
Potassium: 4.4 mmol/L (ref 3.5–5.1)
Sodium: 137 mmol/L (ref 135–145)

## 2023-09-11 LAB — CBC WITH DIFFERENTIAL/PLATELET
Abs Immature Granulocytes: 0.05 10*3/uL (ref 0.00–0.07)
Basophils Absolute: 0 10*3/uL (ref 0.0–0.1)
Basophils Relative: 1 %
Eosinophils Absolute: 0 10*3/uL (ref 0.0–0.5)
Eosinophils Relative: 1 %
HCT: 34.4 % — ABNORMAL LOW (ref 39.0–52.0)
Hemoglobin: 11.2 g/dL — ABNORMAL LOW (ref 13.0–17.0)
Immature Granulocytes: 1 %
Lymphocytes Relative: 12 %
Lymphs Abs: 0.5 10*3/uL — ABNORMAL LOW (ref 0.7–4.0)
MCH: 31 pg (ref 26.0–34.0)
MCHC: 32.6 g/dL (ref 30.0–36.0)
MCV: 95.3 fL (ref 80.0–100.0)
Monocytes Absolute: 0.7 10*3/uL (ref 0.1–1.0)
Monocytes Relative: 19 %
Neutro Abs: 2.6 10*3/uL (ref 1.7–7.7)
Neutrophils Relative %: 66 %
Platelets: 230 10*3/uL (ref 150–400)
RBC: 3.61 MIL/uL — ABNORMAL LOW (ref 4.22–5.81)
RDW: 13.1 % (ref 11.5–15.5)
WBC: 3.8 10*3/uL — ABNORMAL LOW (ref 4.0–10.5)
nRBC: 0 % (ref 0.0–0.2)

## 2023-09-11 LAB — PHOSPHORUS: Phosphorus: 3.1 mg/dL (ref 2.5–4.6)

## 2023-09-11 LAB — MAGNESIUM: Magnesium: 1.6 mg/dL — ABNORMAL LOW (ref 1.7–2.4)

## 2023-09-13 ENCOUNTER — Other Ambulatory Visit (HOSPITAL_COMMUNITY)
Admission: RE | Admit: 2023-09-13 | Discharge: 2023-09-13 | Disposition: A | Discharge status: Home / Self Care | Source: Ambulatory Visit | Attending: *Deleted | Admitting: *Deleted

## 2023-09-13 DIAGNOSIS — Z09 Encounter for follow-up examination after completed treatment for conditions other than malignant neoplasm: Secondary | ICD-10-CM | POA: Insufficient documentation

## 2023-09-13 DIAGNOSIS — D631 Anemia in chronic kidney disease: Secondary | ICD-10-CM | POA: Diagnosis not present

## 2023-09-13 DIAGNOSIS — T8610 Unspecified complication of kidney transplant: Secondary | ICD-10-CM | POA: Insufficient documentation

## 2023-09-13 DIAGNOSIS — N390 Urinary tract infection, site not specified: Secondary | ICD-10-CM | POA: Insufficient documentation

## 2023-09-13 DIAGNOSIS — Z794 Long term (current) use of insulin: Secondary | ICD-10-CM | POA: Diagnosis not present

## 2023-09-13 DIAGNOSIS — D899 Disorder involving the immune mechanism, unspecified: Secondary | ICD-10-CM | POA: Insufficient documentation

## 2023-09-13 DIAGNOSIS — B259 Cytomegaloviral disease, unspecified: Secondary | ICD-10-CM | POA: Diagnosis not present

## 2023-09-13 DIAGNOSIS — Z9483 Pancreas transplant status: Secondary | ICD-10-CM | POA: Diagnosis not present

## 2023-09-13 DIAGNOSIS — E1129 Type 2 diabetes mellitus with other diabetic kidney complication: Secondary | ICD-10-CM | POA: Diagnosis not present

## 2023-09-13 DIAGNOSIS — Z789 Other specified health status: Secondary | ICD-10-CM | POA: Diagnosis not present

## 2023-09-13 DIAGNOSIS — Z79899 Other long term (current) drug therapy: Secondary | ICD-10-CM | POA: Insufficient documentation

## 2023-09-13 DIAGNOSIS — Z114 Encounter for screening for human immunodeficiency virus [HIV]: Secondary | ICD-10-CM | POA: Diagnosis not present

## 2023-09-13 LAB — BASIC METABOLIC PANEL WITH GFR
Anion gap: 10 (ref 5–15)
BUN: 28 mg/dL — ABNORMAL HIGH (ref 6–20)
CO2: 20 mmol/L — ABNORMAL LOW (ref 22–32)
Calcium: 9.4 mg/dL (ref 8.9–10.3)
Chloride: 109 mmol/L (ref 98–111)
Creatinine, Ser: 3.51 mg/dL — ABNORMAL HIGH (ref 0.61–1.24)
GFR, Estimated: 19 mL/min — ABNORMAL LOW (ref >60)
Glucose, Bld: 123 mg/dL — ABNORMAL HIGH (ref 70–99)
Potassium: 3.8 mmol/L (ref 3.5–5.1)
Sodium: 139 mmol/L (ref 135–145)

## 2023-09-13 LAB — CBC WITH DIFFERENTIAL/PLATELET
Abs Immature Granulocytes: 0.06 10*3/uL (ref 0.00–0.07)
Basophils Absolute: 0 10*3/uL (ref 0.0–0.1)
Basophils Relative: 1 %
Eosinophils Absolute: 0 10*3/uL (ref 0.0–0.5)
Eosinophils Relative: 1 %
HCT: 34.1 % — ABNORMAL LOW (ref 39.0–52.0)
Hemoglobin: 11 g/dL — ABNORMAL LOW (ref 13.0–17.0)
Immature Granulocytes: 2 %
Lymphocytes Relative: 9 %
Lymphs Abs: 0.4 10*3/uL — ABNORMAL LOW (ref 0.7–4.0)
MCH: 30.7 pg (ref 26.0–34.0)
MCHC: 32.3 g/dL (ref 30.0–36.0)
MCV: 95.3 fL (ref 80.0–100.0)
Monocytes Absolute: 0.6 10*3/uL (ref 0.1–1.0)
Monocytes Relative: 16 %
Neutro Abs: 3 10*3/uL (ref 1.7–7.7)
Neutrophils Relative %: 71 %
Platelets: 211 10*3/uL (ref 150–400)
RBC: 3.58 MIL/uL — ABNORMAL LOW (ref 4.22–5.81)
RDW: 13.2 % (ref 11.5–15.5)
WBC: 4.1 10*3/uL (ref 4.0–10.5)
nRBC: 0 % (ref 0.0–0.2)

## 2023-09-13 LAB — MAGNESIUM: Magnesium: 1.3 mg/dL — ABNORMAL LOW (ref 1.7–2.4)

## 2023-09-13 LAB — PHOSPHORUS: Phosphorus: 2.7 mg/dL (ref 2.5–4.6)

## 2023-09-13 LAB — TACROLIMUS LEVEL: Tacrolimus (FK506) - LabCorp: 9.1 ng/mL (ref 5.0–20.0)

## 2023-09-14 LAB — TACROLIMUS LEVEL: Tacrolimus (FK506) - LabCorp: 10.2 ng/mL (ref 5.0–20.0)

## 2023-09-16 ENCOUNTER — Other Ambulatory Visit (HOSPITAL_COMMUNITY)
Admission: AD | Admit: 2023-09-16 | Discharge: 2023-09-16 | Disposition: A | Discharge status: Home / Self Care | Source: Ambulatory Visit | Attending: *Deleted | Admitting: *Deleted

## 2023-09-16 DIAGNOSIS — Z79899 Other long term (current) drug therapy: Secondary | ICD-10-CM | POA: Diagnosis not present

## 2023-09-16 DIAGNOSIS — E1122 Type 2 diabetes mellitus with diabetic chronic kidney disease: Secondary | ICD-10-CM | POA: Insufficient documentation

## 2023-09-16 DIAGNOSIS — E559 Vitamin D deficiency, unspecified: Secondary | ICD-10-CM | POA: Insufficient documentation

## 2023-09-16 DIAGNOSIS — Z9483 Pancreas transplant status: Secondary | ICD-10-CM | POA: Diagnosis not present

## 2023-09-16 DIAGNOSIS — D631 Anemia in chronic kidney disease: Secondary | ICD-10-CM | POA: Diagnosis not present

## 2023-09-16 DIAGNOSIS — N390 Urinary tract infection, site not specified: Secondary | ICD-10-CM | POA: Insufficient documentation

## 2023-09-16 DIAGNOSIS — N189 Chronic kidney disease, unspecified: Secondary | ICD-10-CM | POA: Insufficient documentation

## 2023-09-16 DIAGNOSIS — Z789 Other specified health status: Secondary | ICD-10-CM | POA: Diagnosis not present

## 2023-09-16 DIAGNOSIS — E1129 Type 2 diabetes mellitus with other diabetic kidney complication: Secondary | ICD-10-CM | POA: Insufficient documentation

## 2023-09-16 DIAGNOSIS — Z114 Encounter for screening for human immunodeficiency virus [HIV]: Secondary | ICD-10-CM | POA: Insufficient documentation

## 2023-09-16 DIAGNOSIS — T8610 Unspecified complication of kidney transplant: Secondary | ICD-10-CM | POA: Diagnosis not present

## 2023-09-16 DIAGNOSIS — D899 Disorder involving the immune mechanism, unspecified: Secondary | ICD-10-CM | POA: Insufficient documentation

## 2023-09-16 DIAGNOSIS — B259 Cytomegaloviral disease, unspecified: Secondary | ICD-10-CM | POA: Diagnosis not present

## 2023-09-16 DIAGNOSIS — Z09 Encounter for follow-up examination after completed treatment for conditions other than malignant neoplasm: Secondary | ICD-10-CM | POA: Insufficient documentation

## 2023-09-16 LAB — CBC WITH DIFFERENTIAL/PLATELET
Abs Immature Granulocytes: 0.03 10*3/uL (ref 0.00–0.07)
Basophils Absolute: 0 10*3/uL (ref 0.0–0.1)
Basophils Relative: 0 %
Eosinophils Absolute: 0 10*3/uL (ref 0.0–0.5)
Eosinophils Relative: 1 %
HCT: 34.4 % — ABNORMAL LOW (ref 39.0–52.0)
Hemoglobin: 11.3 g/dL — ABNORMAL LOW (ref 13.0–17.0)
Immature Granulocytes: 1 %
Lymphocytes Relative: 13 %
Lymphs Abs: 0.5 10*3/uL — ABNORMAL LOW (ref 0.7–4.0)
MCH: 31 pg (ref 26.0–34.0)
MCHC: 32.8 g/dL (ref 30.0–36.0)
MCV: 94.2 fL (ref 80.0–100.0)
Monocytes Absolute: 0.6 10*3/uL (ref 0.1–1.0)
Monocytes Relative: 16 %
Neutro Abs: 2.5 10*3/uL (ref 1.7–7.7)
Neutrophils Relative %: 69 %
Platelets: 190 10*3/uL (ref 150–400)
RBC: 3.65 MIL/uL — ABNORMAL LOW (ref 4.22–5.81)
RDW: 13.4 % (ref 11.5–15.5)
WBC: 3.6 10*3/uL — ABNORMAL LOW (ref 4.0–10.5)
nRBC: 0 % (ref 0.0–0.2)

## 2023-09-16 LAB — BASIC METABOLIC PANEL WITH GFR
Anion gap: 10 (ref 5–15)
BUN: 27 mg/dL — ABNORMAL HIGH (ref 6–20)
CO2: 21 mmol/L — ABNORMAL LOW (ref 22–32)
Calcium: 9.5 mg/dL (ref 8.9–10.3)
Chloride: 110 mmol/L (ref 98–111)
Creatinine, Ser: 3.13 mg/dL — ABNORMAL HIGH (ref 0.61–1.24)
GFR, Estimated: 22 mL/min — ABNORMAL LOW (ref >60)
Glucose, Bld: 116 mg/dL — ABNORMAL HIGH (ref 70–99)
Potassium: 3.9 mmol/L (ref 3.5–5.1)
Sodium: 141 mmol/L (ref 135–145)

## 2023-09-16 LAB — MAGNESIUM: Magnesium: 1.4 mg/dL — ABNORMAL LOW (ref 1.7–2.4)

## 2023-09-16 LAB — PHOSPHORUS: Phosphorus: 2.3 mg/dL — ABNORMAL LOW (ref 2.5–4.6)

## 2023-09-17 LAB — TACROLIMUS LEVEL: Tacrolimus (FK506) - LabCorp: 10.2 ng/mL (ref 5.0–20.0)

## 2023-09-23 ENCOUNTER — Other Ambulatory Visit (HOSPITAL_COMMUNITY)
Admission: RE | Admit: 2023-09-23 | Discharge: 2023-09-23 | Disposition: A | Discharge status: Home / Self Care | Source: Ambulatory Visit | Attending: *Deleted | Admitting: *Deleted

## 2023-09-23 DIAGNOSIS — Z09 Encounter for follow-up examination after completed treatment for conditions other than malignant neoplasm: Secondary | ICD-10-CM | POA: Diagnosis not present

## 2023-09-23 DIAGNOSIS — N189 Chronic kidney disease, unspecified: Secondary | ICD-10-CM | POA: Insufficient documentation

## 2023-09-23 DIAGNOSIS — Z9483 Pancreas transplant status: Secondary | ICD-10-CM | POA: Insufficient documentation

## 2023-09-23 DIAGNOSIS — Z940 Kidney transplant status: Secondary | ICD-10-CM | POA: Diagnosis present

## 2023-09-23 DIAGNOSIS — D631 Anemia in chronic kidney disease: Secondary | ICD-10-CM | POA: Insufficient documentation

## 2023-09-23 DIAGNOSIS — N390 Urinary tract infection, site not specified: Secondary | ICD-10-CM | POA: Diagnosis not present

## 2023-09-23 DIAGNOSIS — Z79899 Other long term (current) drug therapy: Secondary | ICD-10-CM | POA: Diagnosis not present

## 2023-09-23 DIAGNOSIS — D899 Disorder involving the immune mechanism, unspecified: Secondary | ICD-10-CM | POA: Diagnosis not present

## 2023-09-23 DIAGNOSIS — Z114 Encounter for screening for human immunodeficiency virus [HIV]: Secondary | ICD-10-CM | POA: Diagnosis not present

## 2023-09-23 DIAGNOSIS — E1129 Type 2 diabetes mellitus with other diabetic kidney complication: Secondary | ICD-10-CM | POA: Diagnosis not present

## 2023-09-23 DIAGNOSIS — B259 Cytomegaloviral disease, unspecified: Secondary | ICD-10-CM | POA: Diagnosis not present

## 2023-09-23 DIAGNOSIS — E559 Vitamin D deficiency, unspecified: Secondary | ICD-10-CM | POA: Diagnosis not present

## 2023-09-23 DIAGNOSIS — Z789 Other specified health status: Secondary | ICD-10-CM | POA: Insufficient documentation

## 2023-09-23 LAB — CBC WITH DIFFERENTIAL/PLATELET
Abs Immature Granulocytes: 0.04 10*3/uL (ref 0.00–0.07)
Basophils Absolute: 0 10*3/uL (ref 0.0–0.1)
Basophils Relative: 0 %
Eosinophils Absolute: 0 10*3/uL (ref 0.0–0.5)
Eosinophils Relative: 1 %
HCT: 34.5 % — ABNORMAL LOW (ref 39.0–52.0)
Hemoglobin: 11.3 g/dL — ABNORMAL LOW (ref 13.0–17.0)
Immature Granulocytes: 1 %
Lymphocytes Relative: 13 %
Lymphs Abs: 0.5 10*3/uL — ABNORMAL LOW (ref 0.7–4.0)
MCH: 30.7 pg (ref 26.0–34.0)
MCHC: 32.8 g/dL (ref 30.0–36.0)
MCV: 93.8 fL (ref 80.0–100.0)
Monocytes Absolute: 0.5 10*3/uL (ref 0.1–1.0)
Monocytes Relative: 14 %
Neutro Abs: 2.6 10*3/uL (ref 1.7–7.7)
Neutrophils Relative %: 71 %
Platelets: 156 10*3/uL (ref 150–400)
RBC: 3.68 MIL/uL — ABNORMAL LOW (ref 4.22–5.81)
RDW: 13.4 % (ref 11.5–15.5)
WBC: 3.6 10*3/uL — ABNORMAL LOW (ref 4.0–10.5)
nRBC: 0 % (ref 0.0–0.2)

## 2023-09-23 LAB — BASIC METABOLIC PANEL WITH GFR
Anion gap: 9 (ref 5–15)
BUN: 31 mg/dL — ABNORMAL HIGH (ref 6–20)
CO2: 21 mmol/L — ABNORMAL LOW (ref 22–32)
Calcium: 9.4 mg/dL (ref 8.9–10.3)
Chloride: 108 mmol/L (ref 98–111)
Creatinine, Ser: 2.58 mg/dL — ABNORMAL HIGH (ref 0.61–1.24)
GFR, Estimated: 28 mL/min — ABNORMAL LOW (ref >60)
Glucose, Bld: 127 mg/dL — ABNORMAL HIGH (ref 70–99)
Potassium: 4.1 mmol/L (ref 3.5–5.1)
Sodium: 138 mmol/L (ref 135–145)

## 2023-09-23 LAB — PHOSPHORUS: Phosphorus: 2.1 mg/dL — ABNORMAL LOW (ref 2.5–4.6)

## 2023-09-23 LAB — MAGNESIUM: Magnesium: 1.2 mg/dL — ABNORMAL LOW (ref 1.7–2.4)

## 2023-09-25 ENCOUNTER — Other Ambulatory Visit (HOSPITAL_COMMUNITY)
Admission: RE | Admit: 2023-09-25 | Discharge: 2023-09-25 | Disposition: A | Discharge status: Home / Self Care | Source: Ambulatory Visit

## 2023-09-25 DIAGNOSIS — E1122 Type 2 diabetes mellitus with diabetic chronic kidney disease: Secondary | ICD-10-CM | POA: Insufficient documentation

## 2023-09-25 DIAGNOSIS — E559 Vitamin D deficiency, unspecified: Secondary | ICD-10-CM | POA: Insufficient documentation

## 2023-09-25 DIAGNOSIS — Z940 Kidney transplant status: Secondary | ICD-10-CM | POA: Diagnosis not present

## 2023-09-25 DIAGNOSIS — Z789 Other specified health status: Secondary | ICD-10-CM | POA: Diagnosis not present

## 2023-09-25 DIAGNOSIS — Z79899 Other long term (current) drug therapy: Secondary | ICD-10-CM | POA: Insufficient documentation

## 2023-09-25 DIAGNOSIS — Z9483 Pancreas transplant status: Secondary | ICD-10-CM | POA: Diagnosis not present

## 2023-09-25 DIAGNOSIS — D631 Anemia in chronic kidney disease: Secondary | ICD-10-CM | POA: Diagnosis not present

## 2023-09-25 DIAGNOSIS — Z09 Encounter for follow-up examination after completed treatment for conditions other than malignant neoplasm: Secondary | ICD-10-CM | POA: Diagnosis not present

## 2023-09-25 DIAGNOSIS — D899 Disorder involving the immune mechanism, unspecified: Secondary | ICD-10-CM | POA: Diagnosis present

## 2023-09-25 DIAGNOSIS — N189 Chronic kidney disease, unspecified: Secondary | ICD-10-CM | POA: Diagnosis not present

## 2023-09-25 LAB — BASIC METABOLIC PANEL WITH GFR
Anion gap: 10 (ref 5–15)
BUN: 30 mg/dL — ABNORMAL HIGH (ref 6–20)
CO2: 22 mmol/L (ref 22–32)
Calcium: 9.4 mg/dL (ref 8.9–10.3)
Chloride: 110 mmol/L (ref 98–111)
Creatinine, Ser: 2.75 mg/dL — ABNORMAL HIGH (ref 0.61–1.24)
GFR, Estimated: 26 mL/min — ABNORMAL LOW (ref >60)
Glucose, Bld: 161 mg/dL — ABNORMAL HIGH (ref 70–99)
Potassium: 3.9 mmol/L (ref 3.5–5.1)
Sodium: 142 mmol/L (ref 135–145)

## 2023-09-25 LAB — CBC WITH DIFFERENTIAL/PLATELET
Abs Immature Granulocytes: 0.04 10*3/uL (ref 0.00–0.07)
Basophils Absolute: 0 10*3/uL (ref 0.0–0.1)
Basophils Relative: 0 %
Eosinophils Absolute: 0 10*3/uL (ref 0.0–0.5)
Eosinophils Relative: 1 %
HCT: 34.3 % — ABNORMAL LOW (ref 39.0–52.0)
Hemoglobin: 11.1 g/dL — ABNORMAL LOW (ref 13.0–17.0)
Immature Granulocytes: 1 %
Lymphocytes Relative: 12 %
Lymphs Abs: 0.5 10*3/uL — ABNORMAL LOW (ref 0.7–4.0)
MCH: 30.8 pg (ref 26.0–34.0)
MCHC: 32.4 g/dL (ref 30.0–36.0)
MCV: 95.3 fL (ref 80.0–100.0)
Monocytes Absolute: 0.5 10*3/uL (ref 0.1–1.0)
Monocytes Relative: 14 %
Neutro Abs: 2.7 10*3/uL (ref 1.7–7.7)
Neutrophils Relative %: 72 %
Platelets: 158 10*3/uL (ref 150–400)
RBC: 3.6 MIL/uL — ABNORMAL LOW (ref 4.22–5.81)
RDW: 13.6 % (ref 11.5–15.5)
WBC: 3.8 10*3/uL — ABNORMAL LOW (ref 4.0–10.5)
nRBC: 0 % (ref 0.0–0.2)

## 2023-09-25 LAB — TACROLIMUS LEVEL: Tacrolimus (FK506) - LabCorp: 10.5 ng/mL (ref 5.0–20.0)

## 2023-09-25 LAB — PHOSPHORUS: Phosphorus: 2.6 mg/dL (ref 2.5–4.6)

## 2023-09-25 LAB — MAGNESIUM: Magnesium: 1.2 mg/dL — ABNORMAL LOW (ref 1.7–2.4)

## 2023-09-27 ENCOUNTER — Other Ambulatory Visit (HOSPITAL_COMMUNITY)
Admission: AD | Admit: 2023-09-27 | Discharge: 2023-09-27 | Disposition: A | Discharge status: Home / Self Care | Source: Ambulatory Visit

## 2023-09-27 DIAGNOSIS — D631 Anemia in chronic kidney disease: Secondary | ICD-10-CM | POA: Insufficient documentation

## 2023-09-27 DIAGNOSIS — D899 Disorder involving the immune mechanism, unspecified: Secondary | ICD-10-CM | POA: Insufficient documentation

## 2023-09-27 DIAGNOSIS — N390 Urinary tract infection, site not specified: Secondary | ICD-10-CM | POA: Insufficient documentation

## 2023-09-27 DIAGNOSIS — Z789 Other specified health status: Secondary | ICD-10-CM | POA: Diagnosis not present

## 2023-09-27 DIAGNOSIS — Z114 Encounter for screening for human immunodeficiency virus [HIV]: Secondary | ICD-10-CM | POA: Insufficient documentation

## 2023-09-27 DIAGNOSIS — Z09 Encounter for follow-up examination after completed treatment for conditions other than malignant neoplasm: Secondary | ICD-10-CM | POA: Diagnosis present

## 2023-09-27 DIAGNOSIS — E559 Vitamin D deficiency, unspecified: Secondary | ICD-10-CM | POA: Insufficient documentation

## 2023-09-27 DIAGNOSIS — Z79899 Other long term (current) drug therapy: Secondary | ICD-10-CM | POA: Insufficient documentation

## 2023-09-27 DIAGNOSIS — Z9483 Pancreas transplant status: Secondary | ICD-10-CM | POA: Diagnosis not present

## 2023-09-27 DIAGNOSIS — E1129 Type 2 diabetes mellitus with other diabetic kidney complication: Secondary | ICD-10-CM | POA: Insufficient documentation

## 2023-09-27 DIAGNOSIS — Z940 Kidney transplant status: Secondary | ICD-10-CM | POA: Diagnosis not present

## 2023-09-27 DIAGNOSIS — N189 Chronic kidney disease, unspecified: Secondary | ICD-10-CM | POA: Diagnosis not present

## 2023-09-27 DIAGNOSIS — B259 Cytomegaloviral disease, unspecified: Secondary | ICD-10-CM | POA: Diagnosis not present

## 2023-09-27 LAB — CBC WITH DIFFERENTIAL/PLATELET
Abs Immature Granulocytes: 0.04 10*3/uL (ref 0.00–0.07)
Basophils Absolute: 0 10*3/uL (ref 0.0–0.1)
Basophils Relative: 1 %
Eosinophils Absolute: 0.1 10*3/uL (ref 0.0–0.5)
Eosinophils Relative: 1 %
HCT: 34.8 % — ABNORMAL LOW (ref 39.0–52.0)
Hemoglobin: 11.5 g/dL — ABNORMAL LOW (ref 13.0–17.0)
Immature Granulocytes: 1 %
Lymphocytes Relative: 12 %
Lymphs Abs: 0.5 10*3/uL — ABNORMAL LOW (ref 0.7–4.0)
MCH: 30.8 pg (ref 26.0–34.0)
MCHC: 33 g/dL (ref 30.0–36.0)
MCV: 93.3 fL (ref 80.0–100.0)
Monocytes Absolute: 0.7 10*3/uL (ref 0.1–1.0)
Monocytes Relative: 16 %
Neutro Abs: 2.9 10*3/uL (ref 1.7–7.7)
Neutrophils Relative %: 69 %
Platelets: 183 10*3/uL (ref 150–400)
RBC: 3.73 MIL/uL — ABNORMAL LOW (ref 4.22–5.81)
RDW: 13.5 % (ref 11.5–15.5)
WBC: 4.2 10*3/uL (ref 4.0–10.5)
nRBC: 0 % (ref 0.0–0.2)

## 2023-09-27 LAB — BASIC METABOLIC PANEL WITH GFR
Anion gap: 9 (ref 5–15)
BUN: 26 mg/dL — ABNORMAL HIGH (ref 6–20)
CO2: 21 mmol/L — ABNORMAL LOW (ref 22–32)
Calcium: 9.4 mg/dL (ref 8.9–10.3)
Chloride: 111 mmol/L (ref 98–111)
Creatinine, Ser: 2.55 mg/dL — ABNORMAL HIGH (ref 0.61–1.24)
GFR, Estimated: 29 mL/min — ABNORMAL LOW (ref >60)
Glucose, Bld: 120 mg/dL — ABNORMAL HIGH (ref 70–99)
Potassium: 4 mmol/L (ref 3.5–5.1)
Sodium: 141 mmol/L (ref 135–145)

## 2023-09-27 LAB — PHOSPHORUS: Phosphorus: 2.1 mg/dL — ABNORMAL LOW (ref 2.5–4.6)

## 2023-09-27 LAB — MAGNESIUM: Magnesium: 1.2 mg/dL — ABNORMAL LOW (ref 1.7–2.4)

## 2023-09-27 LAB — TACROLIMUS LEVEL: Tacrolimus (FK506) - LabCorp: 10.4 ng/mL (ref 5.0–20.0)

## 2023-09-28 LAB — BK QUANT PCR (PLASMA/SERUM): BK Quantitaion PCR: NEGATIVE [IU]/mL

## 2023-09-29 LAB — TACROLIMUS LEVEL: Tacrolimus (FK506) - LabCorp: 12.6 ng/mL (ref 5.0–20.0)

## 2023-09-30 ENCOUNTER — Other Ambulatory Visit (HOSPITAL_COMMUNITY)
Admission: AD | Admit: 2023-09-30 | Discharge: 2023-09-30 | Disposition: A | Discharge status: Home / Self Care | Source: Ambulatory Visit | Attending: Neurology | Admitting: Neurology

## 2023-09-30 DIAGNOSIS — Z79899 Other long term (current) drug therapy: Secondary | ICD-10-CM | POA: Diagnosis not present

## 2023-09-30 DIAGNOSIS — E559 Vitamin D deficiency, unspecified: Secondary | ICD-10-CM | POA: Diagnosis not present

## 2023-09-30 DIAGNOSIS — D631 Anemia in chronic kidney disease: Secondary | ICD-10-CM | POA: Diagnosis not present

## 2023-09-30 DIAGNOSIS — E1129 Type 2 diabetes mellitus with other diabetic kidney complication: Secondary | ICD-10-CM | POA: Insufficient documentation

## 2023-09-30 DIAGNOSIS — Z789 Other specified health status: Secondary | ICD-10-CM | POA: Diagnosis not present

## 2023-09-30 DIAGNOSIS — N390 Urinary tract infection, site not specified: Secondary | ICD-10-CM | POA: Insufficient documentation

## 2023-09-30 DIAGNOSIS — T8610 Unspecified complication of kidney transplant: Secondary | ICD-10-CM | POA: Diagnosis not present

## 2023-09-30 DIAGNOSIS — Z114 Encounter for screening for human immunodeficiency virus [HIV]: Secondary | ICD-10-CM | POA: Diagnosis not present

## 2023-09-30 DIAGNOSIS — B259 Cytomegaloviral disease, unspecified: Secondary | ICD-10-CM | POA: Insufficient documentation

## 2023-09-30 DIAGNOSIS — N189 Chronic kidney disease, unspecified: Secondary | ICD-10-CM | POA: Insufficient documentation

## 2023-09-30 DIAGNOSIS — E1122 Type 2 diabetes mellitus with diabetic chronic kidney disease: Secondary | ICD-10-CM | POA: Insufficient documentation

## 2023-09-30 DIAGNOSIS — Z9483 Pancreas transplant status: Secondary | ICD-10-CM | POA: Diagnosis not present

## 2023-09-30 DIAGNOSIS — D899 Disorder involving the immune mechanism, unspecified: Secondary | ICD-10-CM | POA: Insufficient documentation

## 2023-09-30 DIAGNOSIS — Z09 Encounter for follow-up examination after completed treatment for conditions other than malignant neoplasm: Secondary | ICD-10-CM | POA: Insufficient documentation

## 2023-09-30 LAB — BASIC METABOLIC PANEL WITH GFR
Anion gap: 10 (ref 5–15)
BUN: 21 mg/dL — ABNORMAL HIGH (ref 6–20)
CO2: 21 mmol/L — ABNORMAL LOW (ref 22–32)
Calcium: 9.5 mg/dL (ref 8.9–10.3)
Chloride: 108 mmol/L (ref 98–111)
Creatinine, Ser: 2.17 mg/dL — ABNORMAL HIGH (ref 0.61–1.24)
GFR, Estimated: 35 mL/min — ABNORMAL LOW (ref >60)
Glucose, Bld: 117 mg/dL — ABNORMAL HIGH (ref 70–99)
Potassium: 3.9 mmol/L (ref 3.5–5.1)
Sodium: 139 mmol/L (ref 135–145)

## 2023-09-30 LAB — CBC WITH DIFFERENTIAL/PLATELET
Abs Immature Granulocytes: 0.04 10*3/uL (ref 0.00–0.07)
Basophils Absolute: 0 10*3/uL (ref 0.0–0.1)
Basophils Relative: 1 %
Eosinophils Absolute: 0.1 10*3/uL (ref 0.0–0.5)
Eosinophils Relative: 1 %
HCT: 35.2 % — ABNORMAL LOW (ref 39.0–52.0)
Hemoglobin: 11.5 g/dL — ABNORMAL LOW (ref 13.0–17.0)
Immature Granulocytes: 1 %
Lymphocytes Relative: 8 %
Lymphs Abs: 0.5 10*3/uL — ABNORMAL LOW (ref 0.7–4.0)
MCH: 30.8 pg (ref 26.0–34.0)
MCHC: 32.7 g/dL (ref 30.0–36.0)
MCV: 94.4 fL (ref 80.0–100.0)
Monocytes Absolute: 0.9 10*3/uL (ref 0.1–1.0)
Monocytes Relative: 15 %
Neutro Abs: 4.5 10*3/uL (ref 1.7–7.7)
Neutrophils Relative %: 74 %
Platelets: 191 10*3/uL (ref 150–400)
RBC: 3.73 MIL/uL — ABNORMAL LOW (ref 4.22–5.81)
RDW: 13.7 % (ref 11.5–15.5)
WBC: 6 10*3/uL (ref 4.0–10.5)
nRBC: 0 % (ref 0.0–0.2)

## 2023-09-30 LAB — HEPATIC FUNCTION PANEL
ALT: 15 U/L (ref 0–44)
AST: 14 U/L — ABNORMAL LOW (ref 15–41)
Albumin: 3.7 g/dL (ref 3.5–5.0)
Alkaline Phosphatase: 113 U/L (ref 38–126)
Bilirubin, Direct: 0.2 mg/dL (ref 0.0–0.2)
Indirect Bilirubin: 0.7 mg/dL (ref 0.3–0.9)
Total Bilirubin: 0.9 mg/dL (ref 0.0–1.2)
Total Protein: 7.3 g/dL (ref 6.5–8.1)

## 2023-09-30 LAB — PHOSPHORUS: Phosphorus: 1.7 mg/dL — ABNORMAL LOW (ref 2.5–4.6)

## 2023-09-30 LAB — MAGNESIUM: Magnesium: 1.2 mg/dL — ABNORMAL LOW (ref 1.7–2.4)

## 2023-10-01 LAB — TACROLIMUS LEVEL: Tacrolimus (FK506) - LabCorp: 10.8 ng/mL (ref 5.0–20.0)

## 2023-10-02 ENCOUNTER — Other Ambulatory Visit (HOSPITAL_COMMUNITY)
Admission: RE | Admit: 2023-10-02 | Discharge: 2023-10-02 | Disposition: A | Discharge status: Home / Self Care | Source: Ambulatory Visit

## 2023-10-02 DIAGNOSIS — Z789 Other specified health status: Secondary | ICD-10-CM | POA: Insufficient documentation

## 2023-10-02 DIAGNOSIS — Z940 Kidney transplant status: Secondary | ICD-10-CM | POA: Insufficient documentation

## 2023-10-02 DIAGNOSIS — D631 Anemia in chronic kidney disease: Secondary | ICD-10-CM | POA: Insufficient documentation

## 2023-10-02 DIAGNOSIS — B259 Cytomegaloviral disease, unspecified: Secondary | ICD-10-CM | POA: Insufficient documentation

## 2023-10-02 DIAGNOSIS — E559 Vitamin D deficiency, unspecified: Secondary | ICD-10-CM | POA: Insufficient documentation

## 2023-10-02 DIAGNOSIS — Z9483 Pancreas transplant status: Secondary | ICD-10-CM | POA: Insufficient documentation

## 2023-10-02 DIAGNOSIS — Z09 Encounter for follow-up examination after completed treatment for conditions other than malignant neoplasm: Secondary | ICD-10-CM | POA: Insufficient documentation

## 2023-10-02 DIAGNOSIS — D899 Disorder involving the immune mechanism, unspecified: Secondary | ICD-10-CM | POA: Insufficient documentation

## 2023-10-02 DIAGNOSIS — Z79899 Other long term (current) drug therapy: Secondary | ICD-10-CM | POA: Diagnosis not present

## 2023-10-02 DIAGNOSIS — Z114 Encounter for screening for human immunodeficiency virus [HIV]: Secondary | ICD-10-CM | POA: Insufficient documentation

## 2023-10-02 DIAGNOSIS — T8610 Unspecified complication of kidney transplant: Secondary | ICD-10-CM | POA: Diagnosis not present

## 2023-10-02 DIAGNOSIS — E1129 Type 2 diabetes mellitus with other diabetic kidney complication: Secondary | ICD-10-CM | POA: Insufficient documentation

## 2023-10-02 DIAGNOSIS — N390 Urinary tract infection, site not specified: Secondary | ICD-10-CM | POA: Insufficient documentation

## 2023-10-02 LAB — CBC WITH DIFFERENTIAL/PLATELET
Abs Immature Granulocytes: 0.03 10*3/uL (ref 0.00–0.07)
Basophils Absolute: 0 10*3/uL (ref 0.0–0.1)
Basophils Relative: 0 %
Eosinophils Absolute: 0 10*3/uL (ref 0.0–0.5)
Eosinophils Relative: 1 %
HCT: 34.6 % — ABNORMAL LOW (ref 39.0–52.0)
Hemoglobin: 11.5 g/dL — ABNORMAL LOW (ref 13.0–17.0)
Immature Granulocytes: 1 %
Lymphocytes Relative: 8 %
Lymphs Abs: 0.5 10*3/uL — ABNORMAL LOW (ref 0.7–4.0)
MCH: 31 pg (ref 26.0–34.0)
MCHC: 33.2 g/dL (ref 30.0–36.0)
MCV: 93.3 fL (ref 80.0–100.0)
Monocytes Absolute: 0.9 10*3/uL (ref 0.1–1.0)
Monocytes Relative: 14 %
Neutro Abs: 4.6 10*3/uL (ref 1.7–7.7)
Neutrophils Relative %: 76 %
Platelets: 164 10*3/uL (ref 150–400)
RBC: 3.71 MIL/uL — ABNORMAL LOW (ref 4.22–5.81)
RDW: 13.5 % (ref 11.5–15.5)
WBC: 6 10*3/uL (ref 4.0–10.5)
nRBC: 0 % (ref 0.0–0.2)

## 2023-10-02 LAB — MAGNESIUM: Magnesium: 1.3 mg/dL — ABNORMAL LOW (ref 1.7–2.4)

## 2023-10-02 LAB — BASIC METABOLIC PANEL WITH GFR
Anion gap: 7 (ref 5–15)
BUN: 21 mg/dL — ABNORMAL HIGH (ref 6–20)
CO2: 23 mmol/L (ref 22–32)
Calcium: 9.2 mg/dL (ref 8.9–10.3)
Chloride: 109 mmol/L (ref 98–111)
Creatinine, Ser: 2.57 mg/dL — ABNORMAL HIGH (ref 0.61–1.24)
GFR, Estimated: 28 mL/min — ABNORMAL LOW (ref >60)
Glucose, Bld: 120 mg/dL — ABNORMAL HIGH (ref 70–99)
Potassium: 3.6 mmol/L (ref 3.5–5.1)
Sodium: 139 mmol/L (ref 135–145)

## 2023-10-02 LAB — PHOSPHORUS: Phosphorus: 2.2 mg/dL — ABNORMAL LOW (ref 2.5–4.6)

## 2023-10-04 LAB — TACROLIMUS LEVEL: Tacrolimus (FK506) - LabCorp: 9.3 ng/mL (ref 5.0–20.0)

## 2023-10-07 ENCOUNTER — Other Ambulatory Visit (HOSPITAL_COMMUNITY)
Admission: AD | Admit: 2023-10-07 | Discharge: 2023-10-07 | Disposition: A | Discharge status: Home / Self Care | Source: Ambulatory Visit | Attending: *Deleted | Admitting: *Deleted

## 2023-10-07 DIAGNOSIS — Z79899 Other long term (current) drug therapy: Secondary | ICD-10-CM | POA: Diagnosis not present

## 2023-10-07 DIAGNOSIS — Z114 Encounter for screening for human immunodeficiency virus [HIV]: Secondary | ICD-10-CM | POA: Insufficient documentation

## 2023-10-07 DIAGNOSIS — N390 Urinary tract infection, site not specified: Secondary | ICD-10-CM | POA: Insufficient documentation

## 2023-10-07 DIAGNOSIS — E1129 Type 2 diabetes mellitus with other diabetic kidney complication: Secondary | ICD-10-CM | POA: Insufficient documentation

## 2023-10-07 DIAGNOSIS — D899 Disorder involving the immune mechanism, unspecified: Secondary | ICD-10-CM | POA: Insufficient documentation

## 2023-10-07 DIAGNOSIS — Z940 Kidney transplant status: Secondary | ICD-10-CM | POA: Diagnosis not present

## 2023-10-07 DIAGNOSIS — B259 Cytomegaloviral disease, unspecified: Secondary | ICD-10-CM | POA: Insufficient documentation

## 2023-10-07 DIAGNOSIS — Z9483 Pancreas transplant status: Secondary | ICD-10-CM | POA: Insufficient documentation

## 2023-10-07 DIAGNOSIS — E559 Vitamin D deficiency, unspecified: Secondary | ICD-10-CM | POA: Diagnosis not present

## 2023-10-07 DIAGNOSIS — Z789 Other specified health status: Secondary | ICD-10-CM | POA: Insufficient documentation

## 2023-10-07 DIAGNOSIS — D631 Anemia in chronic kidney disease: Secondary | ICD-10-CM | POA: Insufficient documentation

## 2023-10-07 DIAGNOSIS — Z09 Encounter for follow-up examination after completed treatment for conditions other than malignant neoplasm: Secondary | ICD-10-CM | POA: Diagnosis present

## 2023-10-07 DIAGNOSIS — N189 Chronic kidney disease, unspecified: Secondary | ICD-10-CM | POA: Insufficient documentation

## 2023-10-07 LAB — CBC WITH DIFFERENTIAL/PLATELET
Abs Immature Granulocytes: 0.04 10*3/uL (ref 0.00–0.07)
Basophils Absolute: 0 10*3/uL (ref 0.0–0.1)
Basophils Relative: 1 %
Eosinophils Absolute: 0.1 10*3/uL (ref 0.0–0.5)
Eosinophils Relative: 1 %
HCT: 35.3 % — ABNORMAL LOW (ref 39.0–52.0)
Hemoglobin: 11.5 g/dL — ABNORMAL LOW (ref 13.0–17.0)
Immature Granulocytes: 1 %
Lymphocytes Relative: 10 %
Lymphs Abs: 0.5 10*3/uL — ABNORMAL LOW (ref 0.7–4.0)
MCH: 30.2 pg (ref 26.0–34.0)
MCHC: 32.6 g/dL (ref 30.0–36.0)
MCV: 92.7 fL (ref 80.0–100.0)
Monocytes Absolute: 0.8 10*3/uL (ref 0.1–1.0)
Monocytes Relative: 16 %
Neutro Abs: 3.6 10*3/uL (ref 1.7–7.7)
Neutrophils Relative %: 71 %
Platelets: 170 10*3/uL (ref 150–400)
RBC: 3.81 MIL/uL — ABNORMAL LOW (ref 4.22–5.81)
RDW: 13.2 % (ref 11.5–15.5)
WBC: 5 10*3/uL (ref 4.0–10.5)
nRBC: 0 % (ref 0.0–0.2)

## 2023-10-07 LAB — MAGNESIUM: Magnesium: 1.3 mg/dL — ABNORMAL LOW (ref 1.7–2.4)

## 2023-10-07 LAB — BASIC METABOLIC PANEL WITH GFR
Anion gap: 10 (ref 5–15)
BUN: 22 mg/dL — ABNORMAL HIGH (ref 6–20)
CO2: 19 mmol/L — ABNORMAL LOW (ref 22–32)
Calcium: 9.7 mg/dL (ref 8.9–10.3)
Chloride: 108 mmol/L (ref 98–111)
Creatinine, Ser: 2.27 mg/dL — ABNORMAL HIGH (ref 0.61–1.24)
GFR, Estimated: 33 mL/min — ABNORMAL LOW (ref >60)
Glucose, Bld: 109 mg/dL — ABNORMAL HIGH (ref 70–99)
Potassium: 3.5 mmol/L (ref 3.5–5.1)
Sodium: 137 mmol/L (ref 135–145)

## 2023-10-07 LAB — PHOSPHORUS: Phosphorus: 2.2 mg/dL — ABNORMAL LOW (ref 2.5–4.6)

## 2023-10-09 ENCOUNTER — Other Ambulatory Visit (HOSPITAL_COMMUNITY)
Admission: RE | Admit: 2023-10-09 | Discharge: 2023-10-09 | Disposition: A | Discharge status: Home / Self Care | Source: Ambulatory Visit

## 2023-10-09 DIAGNOSIS — N189 Chronic kidney disease, unspecified: Secondary | ICD-10-CM | POA: Insufficient documentation

## 2023-10-09 DIAGNOSIS — N390 Urinary tract infection, site not specified: Secondary | ICD-10-CM | POA: Insufficient documentation

## 2023-10-09 DIAGNOSIS — D899 Disorder involving the immune mechanism, unspecified: Secondary | ICD-10-CM | POA: Diagnosis present

## 2023-10-09 DIAGNOSIS — Z114 Encounter for screening for human immunodeficiency virus [HIV]: Secondary | ICD-10-CM | POA: Diagnosis not present

## 2023-10-09 DIAGNOSIS — Z9483 Pancreas transplant status: Secondary | ICD-10-CM | POA: Diagnosis not present

## 2023-10-09 DIAGNOSIS — E1129 Type 2 diabetes mellitus with other diabetic kidney complication: Secondary | ICD-10-CM | POA: Insufficient documentation

## 2023-10-09 DIAGNOSIS — D631 Anemia in chronic kidney disease: Secondary | ICD-10-CM | POA: Diagnosis not present

## 2023-10-09 DIAGNOSIS — B259 Cytomegaloviral disease, unspecified: Secondary | ICD-10-CM | POA: Insufficient documentation

## 2023-10-09 DIAGNOSIS — E559 Vitamin D deficiency, unspecified: Secondary | ICD-10-CM | POA: Insufficient documentation

## 2023-10-09 DIAGNOSIS — Z789 Other specified health status: Secondary | ICD-10-CM | POA: Diagnosis not present

## 2023-10-09 DIAGNOSIS — Z79899 Other long term (current) drug therapy: Secondary | ICD-10-CM | POA: Diagnosis not present

## 2023-10-09 DIAGNOSIS — T8610 Unspecified complication of kidney transplant: Secondary | ICD-10-CM | POA: Diagnosis not present

## 2023-10-09 DIAGNOSIS — Z09 Encounter for follow-up examination after completed treatment for conditions other than malignant neoplasm: Secondary | ICD-10-CM | POA: Diagnosis not present

## 2023-10-09 LAB — CBC
HCT: 34.6 % — ABNORMAL LOW (ref 39.0–52.0)
Hemoglobin: 11.5 g/dL — ABNORMAL LOW (ref 13.0–17.0)
MCH: 30.6 pg (ref 26.0–34.0)
MCHC: 33.2 g/dL (ref 30.0–36.0)
MCV: 92 fL (ref 80.0–100.0)
Platelets: 161 10*3/uL (ref 150–400)
RBC: 3.76 MIL/uL — ABNORMAL LOW (ref 4.22–5.81)
RDW: 13.2 % (ref 11.5–15.5)
WBC: 4.5 10*3/uL (ref 4.0–10.5)
nRBC: 0 % (ref 0.0–0.2)

## 2023-10-09 LAB — BASIC METABOLIC PANEL WITH GFR
Anion gap: 12 (ref 5–15)
BUN: 21 mg/dL — ABNORMAL HIGH (ref 6–20)
CO2: 19 mmol/L — ABNORMAL LOW (ref 22–32)
Calcium: 9.7 mg/dL (ref 8.9–10.3)
Chloride: 106 mmol/L (ref 98–111)
Creatinine, Ser: 2.27 mg/dL — ABNORMAL HIGH (ref 0.61–1.24)
GFR, Estimated: 33 mL/min — ABNORMAL LOW (ref >60)
Glucose, Bld: 116 mg/dL — ABNORMAL HIGH (ref 70–99)
Potassium: 3.8 mmol/L (ref 3.5–5.1)
Sodium: 137 mmol/L (ref 135–145)

## 2023-10-09 LAB — MAGNESIUM: Magnesium: 1.5 mg/dL — ABNORMAL LOW (ref 1.7–2.4)

## 2023-10-09 LAB — TACROLIMUS LEVEL: Tacrolimus (FK506) - LabCorp: 9.5 ng/mL (ref 5.0–20.0)

## 2023-10-09 LAB — PHOSPHORUS: Phosphorus: 2.1 mg/dL — ABNORMAL LOW (ref 2.5–4.6)

## 2023-10-10 LAB — TACROLIMUS LEVEL: Tacrolimus (FK506) - LabCorp: 10.6 ng/mL (ref 5.0–20.0)

## 2023-10-11 ENCOUNTER — Other Ambulatory Visit (HOSPITAL_COMMUNITY)
Admission: AD | Admit: 2023-10-11 | Discharge: 2023-10-11 | Disposition: A | Discharge status: Home / Self Care | Source: Ambulatory Visit

## 2023-10-11 DIAGNOSIS — D899 Disorder involving the immune mechanism, unspecified: Secondary | ICD-10-CM | POA: Diagnosis present

## 2023-10-11 DIAGNOSIS — Z9483 Pancreas transplant status: Secondary | ICD-10-CM | POA: Diagnosis not present

## 2023-10-11 DIAGNOSIS — N390 Urinary tract infection, site not specified: Secondary | ICD-10-CM | POA: Diagnosis not present

## 2023-10-11 DIAGNOSIS — D631 Anemia in chronic kidney disease: Secondary | ICD-10-CM | POA: Diagnosis not present

## 2023-10-11 DIAGNOSIS — E1129 Type 2 diabetes mellitus with other diabetic kidney complication: Secondary | ICD-10-CM | POA: Insufficient documentation

## 2023-10-11 DIAGNOSIS — Z940 Kidney transplant status: Secondary | ICD-10-CM | POA: Insufficient documentation

## 2023-10-11 DIAGNOSIS — B259 Cytomegaloviral disease, unspecified: Secondary | ICD-10-CM | POA: Diagnosis not present

## 2023-10-11 DIAGNOSIS — Z114 Encounter for screening for human immunodeficiency virus [HIV]: Secondary | ICD-10-CM | POA: Insufficient documentation

## 2023-10-11 DIAGNOSIS — Z789 Other specified health status: Secondary | ICD-10-CM | POA: Insufficient documentation

## 2023-10-11 DIAGNOSIS — Z79899 Other long term (current) drug therapy: Secondary | ICD-10-CM | POA: Diagnosis not present

## 2023-10-11 DIAGNOSIS — T8610 Unspecified complication of kidney transplant: Secondary | ICD-10-CM | POA: Insufficient documentation

## 2023-10-11 DIAGNOSIS — E559 Vitamin D deficiency, unspecified: Secondary | ICD-10-CM | POA: Diagnosis present

## 2023-10-11 LAB — CBC WITH DIFFERENTIAL/PLATELET
Abs Immature Granulocytes: 0.04 10*3/uL (ref 0.00–0.07)
Basophils Absolute: 0 10*3/uL (ref 0.0–0.1)
Basophils Relative: 0 %
Eosinophils Absolute: 0 10*3/uL (ref 0.0–0.5)
Eosinophils Relative: 1 %
HCT: 34.9 % — ABNORMAL LOW (ref 39.0–52.0)
Hemoglobin: 11.5 g/dL — ABNORMAL LOW (ref 13.0–17.0)
Immature Granulocytes: 1 %
Lymphocytes Relative: 9 %
Lymphs Abs: 0.5 10*3/uL — ABNORMAL LOW (ref 0.7–4.0)
MCH: 30.6 pg (ref 26.0–34.0)
MCHC: 33 g/dL (ref 30.0–36.0)
MCV: 92.8 fL (ref 80.0–100.0)
Monocytes Absolute: 0.7 10*3/uL (ref 0.1–1.0)
Monocytes Relative: 13 %
Neutro Abs: 4 10*3/uL (ref 1.7–7.7)
Neutrophils Relative %: 76 %
Platelets: 152 10*3/uL (ref 150–400)
RBC: 3.76 MIL/uL — ABNORMAL LOW (ref 4.22–5.81)
RDW: 13.2 % (ref 11.5–15.5)
WBC: 5.3 10*3/uL (ref 4.0–10.5)
nRBC: 0 % (ref 0.0–0.2)

## 2023-10-11 LAB — BASIC METABOLIC PANEL WITH GFR
Anion gap: 9 (ref 5–15)
BUN: 28 mg/dL — ABNORMAL HIGH (ref 6–20)
CO2: 21 mmol/L — ABNORMAL LOW (ref 22–32)
Calcium: 9.5 mg/dL (ref 8.9–10.3)
Chloride: 107 mmol/L (ref 98–111)
Creatinine, Ser: 2.5 mg/dL — ABNORMAL HIGH (ref 0.61–1.24)
GFR, Estimated: 29 mL/min — ABNORMAL LOW (ref >60)
Glucose, Bld: 134 mg/dL — ABNORMAL HIGH (ref 70–99)
Potassium: 4 mmol/L (ref 3.5–5.1)
Sodium: 137 mmol/L (ref 135–145)

## 2023-10-11 LAB — PHOSPHORUS: Phosphorus: 2.1 mg/dL — ABNORMAL LOW (ref 2.5–4.6)

## 2023-10-11 LAB — MAGNESIUM: Magnesium: 1.7 mg/dL (ref 1.7–2.4)

## 2023-10-14 LAB — TACROLIMUS LEVEL: Tacrolimus (FK506) - LabCorp: 8.9 ng/mL (ref 5.0–20.0)

## 2023-10-22 ENCOUNTER — Other Ambulatory Visit (HOSPITAL_COMMUNITY)
Admission: EM | Admit: 2023-10-22 | Discharge: 2023-10-22 | Disposition: A | Discharge status: Home / Self Care | Source: Ambulatory Visit | Attending: Sports Medicine | Admitting: Sports Medicine

## 2023-10-22 DIAGNOSIS — N390 Urinary tract infection, site not specified: Secondary | ICD-10-CM | POA: Diagnosis not present

## 2023-10-22 DIAGNOSIS — E1129 Type 2 diabetes mellitus with other diabetic kidney complication: Secondary | ICD-10-CM | POA: Diagnosis not present

## 2023-10-22 DIAGNOSIS — Z114 Encounter for screening for human immunodeficiency virus [HIV]: Secondary | ICD-10-CM | POA: Insufficient documentation

## 2023-10-22 DIAGNOSIS — Z9483 Pancreas transplant status: Secondary | ICD-10-CM | POA: Diagnosis not present

## 2023-10-22 DIAGNOSIS — N189 Chronic kidney disease, unspecified: Secondary | ICD-10-CM | POA: Diagnosis not present

## 2023-10-22 DIAGNOSIS — Z940 Kidney transplant status: Secondary | ICD-10-CM | POA: Insufficient documentation

## 2023-10-22 DIAGNOSIS — Z09 Encounter for follow-up examination after completed treatment for conditions other than malignant neoplasm: Secondary | ICD-10-CM | POA: Insufficient documentation

## 2023-10-22 DIAGNOSIS — B259 Cytomegaloviral disease, unspecified: Secondary | ICD-10-CM | POA: Insufficient documentation

## 2023-10-22 DIAGNOSIS — D631 Anemia in chronic kidney disease: Secondary | ICD-10-CM | POA: Diagnosis not present

## 2023-10-22 DIAGNOSIS — Z79899 Other long term (current) drug therapy: Secondary | ICD-10-CM | POA: Diagnosis not present

## 2023-10-22 DIAGNOSIS — E559 Vitamin D deficiency, unspecified: Secondary | ICD-10-CM | POA: Diagnosis not present

## 2023-10-22 DIAGNOSIS — Z789 Other specified health status: Secondary | ICD-10-CM | POA: Diagnosis not present

## 2023-10-22 DIAGNOSIS — D899 Disorder involving the immune mechanism, unspecified: Secondary | ICD-10-CM | POA: Diagnosis not present

## 2023-10-22 LAB — CBC WITH DIFFERENTIAL (CANCER CENTER ONLY)
Abs Immature Granulocytes: 0.03 10*3/uL (ref 0.00–0.07)
Basophils Absolute: 0 10*3/uL (ref 0.0–0.1)
Basophils Relative: 0 %
Eosinophils Absolute: 0.1 10*3/uL (ref 0.0–0.5)
Eosinophils Relative: 1 %
HCT: 35.2 % — ABNORMAL LOW (ref 39.0–52.0)
Hemoglobin: 11.4 g/dL — ABNORMAL LOW (ref 13.0–17.0)
Immature Granulocytes: 1 %
Lymphocytes Relative: 11 %
Lymphs Abs: 0.5 10*3/uL — ABNORMAL LOW (ref 0.7–4.0)
MCH: 30.3 pg (ref 26.0–34.0)
MCHC: 32.4 g/dL (ref 30.0–36.0)
MCV: 93.6 fL (ref 80.0–100.0)
Monocytes Absolute: 0.8 10*3/uL (ref 0.1–1.0)
Monocytes Relative: 17 %
Neutro Abs: 3.3 10*3/uL (ref 1.7–7.7)
Neutrophils Relative %: 70 %
Platelet Count: 183 10*3/uL (ref 150–400)
RBC: 3.76 MIL/uL — ABNORMAL LOW (ref 4.22–5.81)
RDW: 13.2 % (ref 11.5–15.5)
WBC Count: 4.6 10*3/uL (ref 4.0–10.5)
nRBC: 0 % (ref 0.0–0.2)

## 2023-10-22 LAB — BASIC METABOLIC PANEL WITH GFR
Anion gap: 6 (ref 5–15)
BUN: 21 mg/dL — ABNORMAL HIGH (ref 6–20)
CO2: 23 mmol/L (ref 22–32)
Calcium: 9.5 mg/dL (ref 8.9–10.3)
Chloride: 108 mmol/L (ref 98–111)
Creatinine, Ser: 2.53 mg/dL — ABNORMAL HIGH (ref 0.61–1.24)
GFR, Estimated: 29 mL/min — ABNORMAL LOW (ref >60)
Glucose, Bld: 121 mg/dL — ABNORMAL HIGH (ref 70–99)
Potassium: 4.3 mmol/L (ref 3.5–5.1)
Sodium: 137 mmol/L (ref 135–145)

## 2023-10-22 LAB — PHOSPHORUS: Phosphorus: 2.9 mg/dL (ref 2.5–4.6)

## 2023-10-22 LAB — MAGNESIUM: Magnesium: 2 mg/dL (ref 1.7–2.4)

## 2023-10-23 LAB — TACROLIMUS LEVEL: Tacrolimus (FK506) - LabCorp: 10.7 ng/mL (ref 5.0–20.0)

## 2023-10-29 ENCOUNTER — Other Ambulatory Visit (HOSPITAL_COMMUNITY)
Admission: EM | Admit: 2023-10-29 | Discharge: 2023-10-29 | Disposition: A | Discharge status: Home / Self Care | Source: Ambulatory Visit

## 2023-10-29 DIAGNOSIS — E1129 Type 2 diabetes mellitus with other diabetic kidney complication: Secondary | ICD-10-CM | POA: Insufficient documentation

## 2023-10-29 DIAGNOSIS — Z9483 Pancreas transplant status: Secondary | ICD-10-CM | POA: Diagnosis not present

## 2023-10-29 DIAGNOSIS — Z114 Encounter for screening for human immunodeficiency virus [HIV]: Secondary | ICD-10-CM | POA: Diagnosis not present

## 2023-10-29 DIAGNOSIS — Z79899 Other long term (current) drug therapy: Secondary | ICD-10-CM | POA: Insufficient documentation

## 2023-10-29 DIAGNOSIS — D899 Disorder involving the immune mechanism, unspecified: Secondary | ICD-10-CM | POA: Diagnosis not present

## 2023-10-29 DIAGNOSIS — Z940 Kidney transplant status: Secondary | ICD-10-CM | POA: Insufficient documentation

## 2023-10-29 DIAGNOSIS — D631 Anemia in chronic kidney disease: Secondary | ICD-10-CM | POA: Insufficient documentation

## 2023-10-29 DIAGNOSIS — N390 Urinary tract infection, site not specified: Secondary | ICD-10-CM | POA: Insufficient documentation

## 2023-10-29 DIAGNOSIS — B259 Cytomegaloviral disease, unspecified: Secondary | ICD-10-CM | POA: Insufficient documentation

## 2023-10-29 DIAGNOSIS — Z09 Encounter for follow-up examination after completed treatment for conditions other than malignant neoplasm: Secondary | ICD-10-CM | POA: Diagnosis not present

## 2023-10-29 DIAGNOSIS — T8610 Unspecified complication of kidney transplant: Secondary | ICD-10-CM | POA: Insufficient documentation

## 2023-10-29 DIAGNOSIS — E559 Vitamin D deficiency, unspecified: Secondary | ICD-10-CM | POA: Insufficient documentation

## 2023-10-29 DIAGNOSIS — Z789 Other specified health status: Secondary | ICD-10-CM | POA: Insufficient documentation

## 2023-10-29 LAB — CBC WITH DIFFERENTIAL/PLATELET
Abs Immature Granulocytes: 0.03 10*3/uL (ref 0.00–0.07)
Basophils Absolute: 0 10*3/uL (ref 0.0–0.1)
Basophils Relative: 0 %
Eosinophils Absolute: 0.1 10*3/uL (ref 0.0–0.5)
Eosinophils Relative: 2 %
HCT: 35.8 % — ABNORMAL LOW (ref 39.0–52.0)
Hemoglobin: 11.6 g/dL — ABNORMAL LOW (ref 13.0–17.0)
Immature Granulocytes: 1 %
Lymphocytes Relative: 11 %
Lymphs Abs: 0.5 10*3/uL — ABNORMAL LOW (ref 0.7–4.0)
MCH: 30.2 pg (ref 26.0–34.0)
MCHC: 32.4 g/dL (ref 30.0–36.0)
MCV: 93.2 fL (ref 80.0–100.0)
Monocytes Absolute: 0.8 10*3/uL (ref 0.1–1.0)
Monocytes Relative: 17 %
Neutro Abs: 3.3 10*3/uL (ref 1.7–7.7)
Neutrophils Relative %: 69 %
Platelets: 176 10*3/uL (ref 150–400)
RBC: 3.84 MIL/uL — ABNORMAL LOW (ref 4.22–5.81)
RDW: 13.3 % (ref 11.5–15.5)
WBC: 4.8 10*3/uL (ref 4.0–10.5)
nRBC: 0 % (ref 0.0–0.2)

## 2023-10-29 LAB — MAGNESIUM: Magnesium: 1.8 mg/dL (ref 1.7–2.4)

## 2023-10-29 LAB — BASIC METABOLIC PANEL WITH GFR
Anion gap: 8 (ref 5–15)
BUN: 26 mg/dL — ABNORMAL HIGH (ref 6–20)
CO2: 21 mmol/L — ABNORMAL LOW (ref 22–32)
Calcium: 9.6 mg/dL (ref 8.9–10.3)
Chloride: 107 mmol/L (ref 98–111)
Creatinine, Ser: 2.65 mg/dL — ABNORMAL HIGH (ref 0.61–1.24)
GFR, Estimated: 27 mL/min — ABNORMAL LOW (ref >60)
Glucose, Bld: 115 mg/dL — ABNORMAL HIGH (ref 70–99)
Potassium: 4.2 mmol/L (ref 3.5–5.1)
Sodium: 136 mmol/L (ref 135–145)

## 2023-10-29 LAB — PHOSPHORUS: Phosphorus: 2.6 mg/dL (ref 2.5–4.6)

## 2023-10-31 LAB — TACROLIMUS LEVEL: Tacrolimus (FK506) - LabCorp: 11.3 ng/mL (ref 5.0–20.0)

## 2023-11-05 ENCOUNTER — Other Ambulatory Visit (HOSPITAL_COMMUNITY)
Admission: EM | Admit: 2023-11-05 | Discharge: 2023-11-05 | Disposition: A | Discharge status: Home / Self Care | Source: Ambulatory Visit | Attending: *Deleted | Admitting: *Deleted

## 2023-11-05 DIAGNOSIS — B259 Cytomegaloviral disease, unspecified: Secondary | ICD-10-CM | POA: Insufficient documentation

## 2023-11-05 DIAGNOSIS — Z9483 Pancreas transplant status: Secondary | ICD-10-CM | POA: Insufficient documentation

## 2023-11-05 DIAGNOSIS — Z789 Other specified health status: Secondary | ICD-10-CM | POA: Diagnosis not present

## 2023-11-05 DIAGNOSIS — T8610 Unspecified complication of kidney transplant: Secondary | ICD-10-CM | POA: Diagnosis not present

## 2023-11-05 DIAGNOSIS — D899 Disorder involving the immune mechanism, unspecified: Secondary | ICD-10-CM | POA: Insufficient documentation

## 2023-11-05 DIAGNOSIS — Z09 Encounter for follow-up examination after completed treatment for conditions other than malignant neoplasm: Secondary | ICD-10-CM | POA: Diagnosis not present

## 2023-11-05 DIAGNOSIS — Z114 Encounter for screening for human immunodeficiency virus [HIV]: Secondary | ICD-10-CM | POA: Diagnosis not present

## 2023-11-05 DIAGNOSIS — Z940 Kidney transplant status: Secondary | ICD-10-CM | POA: Diagnosis not present

## 2023-11-05 DIAGNOSIS — D631 Anemia in chronic kidney disease: Secondary | ICD-10-CM | POA: Diagnosis not present

## 2023-11-05 DIAGNOSIS — Z79899 Other long term (current) drug therapy: Secondary | ICD-10-CM | POA: Insufficient documentation

## 2023-11-05 DIAGNOSIS — E1129 Type 2 diabetes mellitus with other diabetic kidney complication: Secondary | ICD-10-CM | POA: Diagnosis not present

## 2023-11-05 DIAGNOSIS — N390 Urinary tract infection, site not specified: Secondary | ICD-10-CM | POA: Insufficient documentation

## 2023-11-05 DIAGNOSIS — E559 Vitamin D deficiency, unspecified: Secondary | ICD-10-CM | POA: Insufficient documentation

## 2023-11-05 LAB — CBC WITH DIFFERENTIAL/PLATELET
Abs Immature Granulocytes: 0.02 10*3/uL (ref 0.00–0.07)
Basophils Absolute: 0 10*3/uL (ref 0.0–0.1)
Basophils Relative: 0 %
Eosinophils Absolute: 0.1 10*3/uL (ref 0.0–0.5)
Eosinophils Relative: 1 %
HCT: 36.1 % — ABNORMAL LOW (ref 39.0–52.0)
Hemoglobin: 11.8 g/dL — ABNORMAL LOW (ref 13.0–17.0)
Immature Granulocytes: 0 %
Lymphocytes Relative: 11 %
Lymphs Abs: 0.6 10*3/uL — ABNORMAL LOW (ref 0.7–4.0)
MCH: 29.9 pg (ref 26.0–34.0)
MCHC: 32.7 g/dL (ref 30.0–36.0)
MCV: 91.6 fL (ref 80.0–100.0)
Monocytes Absolute: 0.7 10*3/uL (ref 0.1–1.0)
Monocytes Relative: 15 %
Neutro Abs: 3.5 10*3/uL (ref 1.7–7.7)
Neutrophils Relative %: 73 %
Platelets: 182 10*3/uL (ref 150–400)
RBC: 3.94 MIL/uL — ABNORMAL LOW (ref 4.22–5.81)
RDW: 12.9 % (ref 11.5–15.5)
WBC: 4.9 10*3/uL (ref 4.0–10.5)
nRBC: 0 % (ref 0.0–0.2)

## 2023-11-05 LAB — BASIC METABOLIC PANEL WITH GFR
Anion gap: 11 (ref 5–15)
BUN: 35 mg/dL — ABNORMAL HIGH (ref 6–20)
CO2: 20 mmol/L — ABNORMAL LOW (ref 22–32)
Calcium: 9.8 mg/dL (ref 8.9–10.3)
Chloride: 106 mmol/L (ref 98–111)
Creatinine, Ser: 2.87 mg/dL — ABNORMAL HIGH (ref 0.61–1.24)
GFR, Estimated: 25 mL/min — ABNORMAL LOW (ref >60)
Glucose, Bld: 112 mg/dL — ABNORMAL HIGH (ref 70–99)
Potassium: 4 mmol/L (ref 3.5–5.1)
Sodium: 137 mmol/L (ref 135–145)

## 2023-11-05 LAB — PHOSPHORUS: Phosphorus: 2.6 mg/dL (ref 2.5–4.6)

## 2023-11-05 LAB — MAGNESIUM: Magnesium: 1.6 mg/dL — ABNORMAL LOW (ref 1.7–2.4)

## 2023-11-07 LAB — TACROLIMUS LEVEL: Tacrolimus (FK506) - LabCorp: 6.7 ng/mL (ref 5.0–20.0)

## 2023-11-19 ENCOUNTER — Other Ambulatory Visit (HOSPITAL_COMMUNITY)
Admission: RE | Admit: 2023-11-19 | Discharge: 2023-11-19 | Disposition: A | Discharge status: Home / Self Care | Source: Ambulatory Visit | Attending: *Deleted | Admitting: *Deleted

## 2023-11-19 DIAGNOSIS — Z9483 Pancreas transplant status: Secondary | ICD-10-CM | POA: Insufficient documentation

## 2023-11-19 DIAGNOSIS — D631 Anemia in chronic kidney disease: Secondary | ICD-10-CM | POA: Insufficient documentation

## 2023-11-19 DIAGNOSIS — E559 Vitamin D deficiency, unspecified: Secondary | ICD-10-CM | POA: Diagnosis present

## 2023-11-19 DIAGNOSIS — D899 Disorder involving the immune mechanism, unspecified: Secondary | ICD-10-CM | POA: Diagnosis present

## 2023-11-19 DIAGNOSIS — Z09 Encounter for follow-up examination after completed treatment for conditions other than malignant neoplasm: Secondary | ICD-10-CM | POA: Diagnosis not present

## 2023-11-19 DIAGNOSIS — E1129 Type 2 diabetes mellitus with other diabetic kidney complication: Secondary | ICD-10-CM | POA: Insufficient documentation

## 2023-11-19 DIAGNOSIS — Z114 Encounter for screening for human immunodeficiency virus [HIV]: Secondary | ICD-10-CM | POA: Insufficient documentation

## 2023-11-19 DIAGNOSIS — Z789 Other specified health status: Secondary | ICD-10-CM | POA: Diagnosis not present

## 2023-11-19 DIAGNOSIS — T8610 Unspecified complication of kidney transplant: Secondary | ICD-10-CM | POA: Insufficient documentation

## 2023-11-19 DIAGNOSIS — N390 Urinary tract infection, site not specified: Secondary | ICD-10-CM | POA: Diagnosis not present

## 2023-11-19 DIAGNOSIS — B259 Cytomegaloviral disease, unspecified: Secondary | ICD-10-CM | POA: Insufficient documentation

## 2023-11-19 DIAGNOSIS — Z940 Kidney transplant status: Secondary | ICD-10-CM | POA: Insufficient documentation

## 2023-11-19 LAB — BASIC METABOLIC PANEL WITH GFR
Anion gap: 10 (ref 5–15)
BUN: 21 mg/dL — ABNORMAL HIGH (ref 6–20)
CO2: 20 mmol/L — ABNORMAL LOW (ref 22–32)
Calcium: 9.2 mg/dL (ref 8.9–10.3)
Chloride: 107 mmol/L (ref 98–111)
Creatinine, Ser: 2.22 mg/dL — ABNORMAL HIGH (ref 0.61–1.24)
GFR, Estimated: 34 mL/min — ABNORMAL LOW (ref >60)
Glucose, Bld: 104 mg/dL — ABNORMAL HIGH (ref 70–99)
Potassium: 3.5 mmol/L (ref 3.5–5.1)
Sodium: 137 mmol/L (ref 135–145)

## 2023-11-19 LAB — CBC WITH DIFFERENTIAL/PLATELET
Abs Immature Granulocytes: 0.03 10*3/uL (ref 0.00–0.07)
Basophils Absolute: 0 10*3/uL (ref 0.0–0.1)
Basophils Relative: 1 %
Eosinophils Absolute: 0.1 10*3/uL (ref 0.0–0.5)
Eosinophils Relative: 1 %
HCT: 34.8 % — ABNORMAL LOW (ref 39.0–52.0)
Hemoglobin: 11.5 g/dL — ABNORMAL LOW (ref 13.0–17.0)
Immature Granulocytes: 1 %
Lymphocytes Relative: 12 %
Lymphs Abs: 0.7 10*3/uL (ref 0.7–4.0)
MCH: 30 pg (ref 26.0–34.0)
MCHC: 33 g/dL (ref 30.0–36.0)
MCV: 90.9 fL (ref 80.0–100.0)
Monocytes Absolute: 0.9 10*3/uL (ref 0.1–1.0)
Monocytes Relative: 16 %
Neutro Abs: 4.2 10*3/uL (ref 1.7–7.7)
Neutrophils Relative %: 69 %
Platelets: 202 10*3/uL (ref 150–400)
RBC: 3.83 MIL/uL — ABNORMAL LOW (ref 4.22–5.81)
RDW: 13.1 % (ref 11.5–15.5)
WBC: 6 10*3/uL (ref 4.0–10.5)
nRBC: 0 % (ref 0.0–0.2)

## 2023-11-19 LAB — MAGNESIUM: Magnesium: 1.5 mg/dL — ABNORMAL LOW (ref 1.7–2.4)

## 2023-11-19 LAB — PHOSPHORUS: Phosphorus: 2.3 mg/dL — ABNORMAL LOW (ref 2.5–4.6)

## 2023-11-20 LAB — TACROLIMUS LEVEL: Tacrolimus (FK506) - LabCorp: 5.9 ng/mL (ref 5.0–20.0)

## 2023-11-26 ENCOUNTER — Other Ambulatory Visit (HOSPITAL_COMMUNITY)
Admission: EM | Admit: 2023-11-26 | Discharge: 2023-11-26 | Disposition: A | Discharge status: Home / Self Care | Source: Ambulatory Visit

## 2023-11-26 DIAGNOSIS — D631 Anemia in chronic kidney disease: Secondary | ICD-10-CM | POA: Diagnosis not present

## 2023-11-26 DIAGNOSIS — Z940 Kidney transplant status: Secondary | ICD-10-CM | POA: Insufficient documentation

## 2023-11-26 DIAGNOSIS — B259 Cytomegaloviral disease, unspecified: Secondary | ICD-10-CM | POA: Insufficient documentation

## 2023-11-26 DIAGNOSIS — E1129 Type 2 diabetes mellitus with other diabetic kidney complication: Secondary | ICD-10-CM | POA: Insufficient documentation

## 2023-11-26 DIAGNOSIS — Z114 Encounter for screening for human immunodeficiency virus [HIV]: Secondary | ICD-10-CM | POA: Diagnosis not present

## 2023-11-26 DIAGNOSIS — T8610 Unspecified complication of kidney transplant: Secondary | ICD-10-CM | POA: Insufficient documentation

## 2023-11-26 DIAGNOSIS — D899 Disorder involving the immune mechanism, unspecified: Secondary | ICD-10-CM | POA: Diagnosis not present

## 2023-11-26 DIAGNOSIS — Z9483 Pancreas transplant status: Secondary | ICD-10-CM | POA: Insufficient documentation

## 2023-11-26 DIAGNOSIS — E559 Vitamin D deficiency, unspecified: Secondary | ICD-10-CM | POA: Diagnosis not present

## 2023-11-26 DIAGNOSIS — Z789 Other specified health status: Secondary | ICD-10-CM | POA: Diagnosis not present

## 2023-11-26 DIAGNOSIS — Z09 Encounter for follow-up examination after completed treatment for conditions other than malignant neoplasm: Secondary | ICD-10-CM | POA: Diagnosis not present

## 2023-11-26 DIAGNOSIS — N390 Urinary tract infection, site not specified: Secondary | ICD-10-CM | POA: Diagnosis not present

## 2023-11-26 DIAGNOSIS — Z79899 Other long term (current) drug therapy: Secondary | ICD-10-CM | POA: Diagnosis not present

## 2023-11-26 LAB — MAGNESIUM: Magnesium: 1.4 mg/dL — ABNORMAL LOW (ref 1.7–2.4)

## 2023-11-26 LAB — CBC WITH DIFFERENTIAL/PLATELET
Abs Immature Granulocytes: 0.03 K/uL (ref 0.00–0.07)
Basophils Absolute: 0 K/uL (ref 0.0–0.1)
Basophils Relative: 1 %
Eosinophils Absolute: 0.1 K/uL (ref 0.0–0.5)
Eosinophils Relative: 1 %
HCT: 32.3 % — ABNORMAL LOW (ref 39.0–52.0)
Hemoglobin: 10.5 g/dL — ABNORMAL LOW (ref 13.0–17.0)
Immature Granulocytes: 1 %
Lymphocytes Relative: 12 %
Lymphs Abs: 0.5 K/uL — ABNORMAL LOW (ref 0.7–4.0)
MCH: 29.9 pg (ref 26.0–34.0)
MCHC: 32.5 g/dL (ref 30.0–36.0)
MCV: 92 fL (ref 80.0–100.0)
Monocytes Absolute: 0.7 K/uL (ref 0.1–1.0)
Monocytes Relative: 17 %
Neutro Abs: 3 K/uL (ref 1.7–7.7)
Neutrophils Relative %: 68 %
Platelets: 211 K/uL (ref 150–400)
RBC: 3.51 MIL/uL — ABNORMAL LOW (ref 4.22–5.81)
RDW: 13.3 % (ref 11.5–15.5)
WBC: 4.3 K/uL (ref 4.0–10.5)
nRBC: 0 % (ref 0.0–0.2)

## 2023-11-26 LAB — BASIC METABOLIC PANEL WITH GFR
Anion gap: 11 (ref 5–15)
BUN: 30 mg/dL — ABNORMAL HIGH (ref 6–20)
CO2: 19 mmol/L — ABNORMAL LOW (ref 22–32)
Calcium: 9.4 mg/dL (ref 8.9–10.3)
Chloride: 108 mmol/L (ref 98–111)
Creatinine, Ser: 2.52 mg/dL — ABNORMAL HIGH (ref 0.61–1.24)
GFR, Estimated: 29 mL/min — ABNORMAL LOW (ref >60)
Glucose, Bld: 106 mg/dL — ABNORMAL HIGH (ref 70–99)
Potassium: 3.7 mmol/L (ref 3.5–5.1)
Sodium: 138 mmol/L (ref 135–145)

## 2023-11-26 LAB — PHOSPHORUS: Phosphorus: 2.3 mg/dL — ABNORMAL LOW (ref 2.5–4.6)

## 2023-11-28 LAB — TACROLIMUS LEVEL: Tacrolimus (FK506) - LabCorp: 7.4 ng/mL (ref 5.0–20.0)

## 2023-12-10 ENCOUNTER — Other Ambulatory Visit (HOSPITAL_COMMUNITY)
Admission: EM | Admit: 2023-12-10 | Discharge: 2023-12-10 | Disposition: A | Discharge status: Home / Self Care | Source: Ambulatory Visit | Attending: *Deleted | Admitting: *Deleted

## 2023-12-10 DIAGNOSIS — Z79899 Other long term (current) drug therapy: Secondary | ICD-10-CM | POA: Insufficient documentation

## 2023-12-10 DIAGNOSIS — E559 Vitamin D deficiency, unspecified: Secondary | ICD-10-CM | POA: Insufficient documentation

## 2023-12-10 DIAGNOSIS — Z114 Encounter for screening for human immunodeficiency virus [HIV]: Secondary | ICD-10-CM | POA: Diagnosis not present

## 2023-12-10 DIAGNOSIS — Z9483 Pancreas transplant status: Secondary | ICD-10-CM | POA: Diagnosis not present

## 2023-12-10 DIAGNOSIS — B259 Cytomegaloviral disease, unspecified: Secondary | ICD-10-CM | POA: Insufficient documentation

## 2023-12-10 DIAGNOSIS — D899 Disorder involving the immune mechanism, unspecified: Secondary | ICD-10-CM | POA: Insufficient documentation

## 2023-12-10 DIAGNOSIS — N390 Urinary tract infection, site not specified: Secondary | ICD-10-CM | POA: Diagnosis not present

## 2023-12-10 DIAGNOSIS — Z09 Encounter for follow-up examination after completed treatment for conditions other than malignant neoplasm: Secondary | ICD-10-CM | POA: Insufficient documentation

## 2023-12-10 DIAGNOSIS — Z940 Kidney transplant status: Secondary | ICD-10-CM | POA: Insufficient documentation

## 2023-12-10 DIAGNOSIS — E1129 Type 2 diabetes mellitus with other diabetic kidney complication: Secondary | ICD-10-CM | POA: Insufficient documentation

## 2023-12-10 LAB — CBC WITH DIFFERENTIAL (CANCER CENTER ONLY)
Abs Immature Granulocytes: 0.02 K/uL (ref 0.00–0.07)
Basophils Absolute: 0 K/uL (ref 0.0–0.1)
Basophils Relative: 0 %
Eosinophils Absolute: 0 K/uL (ref 0.0–0.5)
Eosinophils Relative: 1 %
HCT: 33.2 % — ABNORMAL LOW (ref 39.0–52.0)
Hemoglobin: 11 g/dL — ABNORMAL LOW (ref 13.0–17.0)
Immature Granulocytes: 1 %
Lymphocytes Relative: 13 %
Lymphs Abs: 0.5 K/uL — ABNORMAL LOW (ref 0.7–4.0)
MCH: 30.3 pg (ref 26.0–34.0)
MCHC: 33.1 g/dL (ref 30.0–36.0)
MCV: 91.5 fL (ref 80.0–100.0)
Monocytes Absolute: 0.8 K/uL (ref 0.1–1.0)
Monocytes Relative: 20 %
Neutro Abs: 2.5 K/uL (ref 1.7–7.7)
Neutrophils Relative %: 65 %
Platelet Count: 162 K/uL (ref 150–400)
RBC: 3.63 MIL/uL — ABNORMAL LOW (ref 4.22–5.81)
RDW: 13.2 % (ref 11.5–15.5)
WBC Count: 3.8 K/uL — ABNORMAL LOW (ref 4.0–10.5)
nRBC: 0 % (ref 0.0–0.2)

## 2023-12-10 LAB — BASIC METABOLIC PANEL WITH GFR
Anion gap: 10 (ref 5–15)
BUN: 23 mg/dL — ABNORMAL HIGH (ref 6–20)
CO2: 20 mmol/L — ABNORMAL LOW (ref 22–32)
Calcium: 9.3 mg/dL (ref 8.9–10.3)
Chloride: 104 mmol/L (ref 98–111)
Creatinine, Ser: 2.33 mg/dL — ABNORMAL HIGH (ref 0.61–1.24)
GFR, Estimated: 32 mL/min — ABNORMAL LOW (ref >60)
Glucose, Bld: 103 mg/dL — ABNORMAL HIGH (ref 70–99)
Potassium: 3.7 mmol/L (ref 3.5–5.1)
Sodium: 134 mmol/L — ABNORMAL LOW (ref 135–145)

## 2023-12-10 LAB — MAGNESIUM: Magnesium: 1.3 mg/dL — ABNORMAL LOW (ref 1.7–2.4)

## 2023-12-10 LAB — PHOSPHORUS: Phosphorus: 2.5 mg/dL (ref 2.5–4.6)

## 2023-12-12 LAB — TACROLIMUS LEVEL: Tacrolimus (FK506) - LabCorp: 7.5 ng/mL (ref 5.0–20.0)

## 2024-01-01 ENCOUNTER — Other Ambulatory Visit (HOSPITAL_COMMUNITY)
Admission: RE | Admit: 2024-01-01 | Discharge: 2024-01-01 | Disposition: A | Discharge status: Home / Self Care | Source: Ambulatory Visit

## 2024-01-01 DIAGNOSIS — Z9483 Pancreas transplant status: Secondary | ICD-10-CM | POA: Insufficient documentation

## 2024-01-01 DIAGNOSIS — N189 Chronic kidney disease, unspecified: Secondary | ICD-10-CM | POA: Diagnosis not present

## 2024-01-01 DIAGNOSIS — D899 Disorder involving the immune mechanism, unspecified: Secondary | ICD-10-CM | POA: Diagnosis not present

## 2024-01-01 DIAGNOSIS — D631 Anemia in chronic kidney disease: Secondary | ICD-10-CM | POA: Diagnosis not present

## 2024-01-01 DIAGNOSIS — E1122 Type 2 diabetes mellitus with diabetic chronic kidney disease: Secondary | ICD-10-CM | POA: Insufficient documentation

## 2024-01-01 DIAGNOSIS — Z789 Other specified health status: Secondary | ICD-10-CM | POA: Diagnosis not present

## 2024-01-01 DIAGNOSIS — Z09 Encounter for follow-up examination after completed treatment for conditions other than malignant neoplasm: Secondary | ICD-10-CM | POA: Insufficient documentation

## 2024-01-01 DIAGNOSIS — E559 Vitamin D deficiency, unspecified: Secondary | ICD-10-CM | POA: Insufficient documentation

## 2024-01-01 DIAGNOSIS — Z79899 Other long term (current) drug therapy: Secondary | ICD-10-CM | POA: Insufficient documentation

## 2024-01-01 DIAGNOSIS — Z940 Kidney transplant status: Secondary | ICD-10-CM | POA: Insufficient documentation

## 2024-01-01 LAB — CBC WITH DIFFERENTIAL (CANCER CENTER ONLY)
Abs Immature Granulocytes: 0.05 K/uL (ref 0.00–0.07)
Basophils Absolute: 0 K/uL (ref 0.0–0.1)
Basophils Relative: 1 %
Eosinophils Absolute: 0.1 K/uL (ref 0.0–0.5)
Eosinophils Relative: 2 %
HCT: 35.5 % — ABNORMAL LOW (ref 39.0–52.0)
Hemoglobin: 11.4 g/dL — ABNORMAL LOW (ref 13.0–17.0)
Immature Granulocytes: 1 %
Lymphocytes Relative: 12 %
Lymphs Abs: 0.5 K/uL — ABNORMAL LOW (ref 0.7–4.0)
MCH: 29.6 pg (ref 26.0–34.0)
MCHC: 32.1 g/dL (ref 30.0–36.0)
MCV: 92.2 fL (ref 80.0–100.0)
Monocytes Absolute: 0.9 K/uL (ref 0.1–1.0)
Monocytes Relative: 21 %
Neutro Abs: 2.7 K/uL (ref 1.7–7.7)
Neutrophils Relative %: 63 %
Platelet Count: 150 K/uL (ref 150–400)
RBC: 3.85 MIL/uL — ABNORMAL LOW (ref 4.22–5.81)
RDW: 13.1 % (ref 11.5–15.5)
WBC Count: 4.2 K/uL (ref 4.0–10.5)
nRBC: 0 % (ref 0.0–0.2)

## 2024-01-01 LAB — BASIC METABOLIC PANEL WITH GFR
Anion gap: 11 (ref 5–15)
BUN: 21 mg/dL — ABNORMAL HIGH (ref 6–20)
CO2: 23 mmol/L (ref 22–32)
Calcium: 9.7 mg/dL (ref 8.9–10.3)
Chloride: 106 mmol/L (ref 98–111)
Creatinine, Ser: 2.32 mg/dL — ABNORMAL HIGH (ref 0.61–1.24)
GFR, Estimated: 32 mL/min — ABNORMAL LOW (ref >60)
Glucose, Bld: 114 mg/dL — ABNORMAL HIGH (ref 70–99)
Potassium: 4.4 mmol/L (ref 3.5–5.1)
Sodium: 140 mmol/L (ref 135–145)

## 2024-01-01 LAB — PHOSPHORUS: Phosphorus: 2.4 mg/dL — ABNORMAL LOW (ref 2.5–4.6)

## 2024-01-01 LAB — MAGNESIUM: Magnesium: 1.2 mg/dL — ABNORMAL LOW (ref 1.7–2.4)

## 2024-01-02 LAB — TACROLIMUS LEVEL: Tacrolimus (FK506) - LabCorp: 7.8 ng/mL (ref 5.0–20.0)

## 2024-01-16 ENCOUNTER — Other Ambulatory Visit (HOSPITAL_COMMUNITY)
Admission: RE | Admit: 2024-01-16 | Discharge: 2024-01-16 | Disposition: A | Discharge status: Home / Self Care | Source: Ambulatory Visit

## 2024-01-16 DIAGNOSIS — N189 Chronic kidney disease, unspecified: Secondary | ICD-10-CM | POA: Diagnosis not present

## 2024-01-16 DIAGNOSIS — Z940 Kidney transplant status: Secondary | ICD-10-CM | POA: Diagnosis not present

## 2024-01-16 DIAGNOSIS — Z79899 Other long term (current) drug therapy: Secondary | ICD-10-CM | POA: Diagnosis not present

## 2024-01-16 DIAGNOSIS — D899 Disorder involving the immune mechanism, unspecified: Secondary | ICD-10-CM | POA: Diagnosis not present

## 2024-01-16 DIAGNOSIS — N390 Urinary tract infection, site not specified: Secondary | ICD-10-CM | POA: Diagnosis not present

## 2024-01-16 DIAGNOSIS — Z09 Encounter for follow-up examination after completed treatment for conditions other than malignant neoplasm: Secondary | ICD-10-CM | POA: Diagnosis present

## 2024-01-16 DIAGNOSIS — Z9483 Pancreas transplant status: Secondary | ICD-10-CM | POA: Insufficient documentation

## 2024-01-16 DIAGNOSIS — E559 Vitamin D deficiency, unspecified: Secondary | ICD-10-CM | POA: Diagnosis not present

## 2024-01-16 DIAGNOSIS — E1129 Type 2 diabetes mellitus with other diabetic kidney complication: Secondary | ICD-10-CM | POA: Diagnosis not present

## 2024-01-16 DIAGNOSIS — Z789 Other specified health status: Secondary | ICD-10-CM | POA: Diagnosis not present

## 2024-01-16 DIAGNOSIS — D631 Anemia in chronic kidney disease: Secondary | ICD-10-CM | POA: Diagnosis not present

## 2024-01-16 DIAGNOSIS — B259 Cytomegaloviral disease, unspecified: Secondary | ICD-10-CM | POA: Diagnosis not present

## 2024-01-16 DIAGNOSIS — Z114 Encounter for screening for human immunodeficiency virus [HIV]: Secondary | ICD-10-CM | POA: Diagnosis not present

## 2024-01-16 LAB — BASIC METABOLIC PANEL WITH GFR
Anion gap: 9 (ref 5–15)
BUN: 30 mg/dL — ABNORMAL HIGH (ref 6–20)
CO2: 23 mmol/L (ref 22–32)
Calcium: 9.6 mg/dL (ref 8.9–10.3)
Chloride: 107 mmol/L (ref 98–111)
Creatinine, Ser: 2.47 mg/dL — ABNORMAL HIGH (ref 0.61–1.24)
GFR, Estimated: 29 mL/min — ABNORMAL LOW (ref >60)
Glucose, Bld: 107 mg/dL — ABNORMAL HIGH (ref 70–99)
Potassium: 4 mmol/L (ref 3.5–5.1)
Sodium: 139 mmol/L (ref 135–145)

## 2024-01-16 LAB — CBC WITH DIFFERENTIAL/PLATELET
Abs Immature Granulocytes: 0.04 K/uL (ref 0.00–0.07)
Basophils Absolute: 0 K/uL (ref 0.0–0.1)
Basophils Relative: 1 %
Eosinophils Absolute: 0.1 K/uL (ref 0.0–0.5)
Eosinophils Relative: 2 %
HCT: 36.3 % — ABNORMAL LOW (ref 39.0–52.0)
Hemoglobin: 11.7 g/dL — ABNORMAL LOW (ref 13.0–17.0)
Immature Granulocytes: 1 %
Lymphocytes Relative: 13 %
Lymphs Abs: 0.4 K/uL — ABNORMAL LOW (ref 0.7–4.0)
MCH: 29.6 pg (ref 26.0–34.0)
MCHC: 32.2 g/dL (ref 30.0–36.0)
MCV: 91.9 fL (ref 80.0–100.0)
Monocytes Absolute: 0.9 K/uL (ref 0.1–1.0)
Monocytes Relative: 26 %
Neutro Abs: 1.9 K/uL (ref 1.7–7.7)
Neutrophils Relative %: 57 %
Platelets: 162 K/uL (ref 150–400)
RBC: 3.95 MIL/uL — ABNORMAL LOW (ref 4.22–5.81)
RDW: 12.9 % (ref 11.5–15.5)
WBC: 3.4 K/uL — ABNORMAL LOW (ref 4.0–10.5)
nRBC: 0 % (ref 0.0–0.2)

## 2024-01-16 LAB — MAGNESIUM: Magnesium: 1.3 mg/dL — ABNORMAL LOW (ref 1.7–2.4)

## 2024-01-16 LAB — PHOSPHORUS: Phosphorus: 2.6 mg/dL (ref 2.5–4.6)

## 2024-01-18 LAB — TACROLIMUS LEVEL: Tacrolimus (FK506) - LabCorp: 6.8 ng/mL (ref 5.0–20.0)

## 2024-01-28 ENCOUNTER — Other Ambulatory Visit (HOSPITAL_COMMUNITY)
Admission: AD | Admit: 2024-01-28 | Discharge: 2024-01-28 | Disposition: A | Discharge status: Home / Self Care | Source: Ambulatory Visit

## 2024-01-28 DIAGNOSIS — D631 Anemia in chronic kidney disease: Secondary | ICD-10-CM | POA: Diagnosis not present

## 2024-01-28 DIAGNOSIS — Z79899 Other long term (current) drug therapy: Secondary | ICD-10-CM | POA: Insufficient documentation

## 2024-01-28 DIAGNOSIS — Z9483 Pancreas transplant status: Secondary | ICD-10-CM | POA: Diagnosis not present

## 2024-01-28 DIAGNOSIS — N390 Urinary tract infection, site not specified: Secondary | ICD-10-CM | POA: Diagnosis not present

## 2024-01-28 DIAGNOSIS — Z789 Other specified health status: Secondary | ICD-10-CM | POA: Diagnosis not present

## 2024-01-28 DIAGNOSIS — E1129 Type 2 diabetes mellitus with other diabetic kidney complication: Secondary | ICD-10-CM | POA: Diagnosis not present

## 2024-01-28 DIAGNOSIS — Z09 Encounter for follow-up examination after completed treatment for conditions other than malignant neoplasm: Secondary | ICD-10-CM | POA: Insufficient documentation

## 2024-01-28 DIAGNOSIS — D899 Disorder involving the immune mechanism, unspecified: Secondary | ICD-10-CM | POA: Insufficient documentation

## 2024-01-28 DIAGNOSIS — B259 Cytomegaloviral disease, unspecified: Secondary | ICD-10-CM | POA: Diagnosis not present

## 2024-01-28 DIAGNOSIS — Z940 Kidney transplant status: Secondary | ICD-10-CM | POA: Insufficient documentation

## 2024-01-28 DIAGNOSIS — T8610 Unspecified complication of kidney transplant: Secondary | ICD-10-CM | POA: Insufficient documentation

## 2024-01-28 DIAGNOSIS — Z114 Encounter for screening for human immunodeficiency virus [HIV]: Secondary | ICD-10-CM | POA: Insufficient documentation

## 2024-01-28 DIAGNOSIS — E559 Vitamin D deficiency, unspecified: Secondary | ICD-10-CM | POA: Diagnosis present

## 2024-01-28 LAB — CBC WITH DIFFERENTIAL/PLATELET
Abs Immature Granulocytes: 0.03 K/uL (ref 0.00–0.07)
Basophils Absolute: 0 K/uL (ref 0.0–0.1)
Basophils Relative: 0 %
Eosinophils Absolute: 0.1 K/uL (ref 0.0–0.5)
Eosinophils Relative: 3 %
HCT: 36.9 % — ABNORMAL LOW (ref 39.0–52.0)
Hemoglobin: 11.8 g/dL — ABNORMAL LOW (ref 13.0–17.0)
Immature Granulocytes: 1 %
Lymphocytes Relative: 14 %
Lymphs Abs: 0.4 K/uL — ABNORMAL LOW (ref 0.7–4.0)
MCH: 29.3 pg (ref 26.0–34.0)
MCHC: 32 g/dL (ref 30.0–36.0)
MCV: 91.6 fL (ref 80.0–100.0)
Monocytes Absolute: 0.6 K/uL (ref 0.1–1.0)
Monocytes Relative: 20 %
Neutro Abs: 1.9 K/uL (ref 1.7–7.7)
Neutrophils Relative %: 62 %
Platelets: 130 K/uL — ABNORMAL LOW (ref 150–400)
RBC: 4.03 MIL/uL — ABNORMAL LOW (ref 4.22–5.81)
RDW: 13 % (ref 11.5–15.5)
WBC: 3 K/uL — ABNORMAL LOW (ref 4.0–10.5)
nRBC: 0 % (ref 0.0–0.2)

## 2024-01-28 LAB — BASIC METABOLIC PANEL WITH GFR
Anion gap: 13 (ref 5–15)
BUN: 24 mg/dL — ABNORMAL HIGH (ref 6–20)
CO2: 22 mmol/L (ref 22–32)
Calcium: 9.5 mg/dL (ref 8.9–10.3)
Chloride: 106 mmol/L (ref 98–111)
Creatinine, Ser: 2.28 mg/dL — ABNORMAL HIGH (ref 0.61–1.24)
GFR, Estimated: 32 mL/min — ABNORMAL LOW (ref >60)
Glucose, Bld: 113 mg/dL — ABNORMAL HIGH (ref 70–99)
Potassium: 4.1 mmol/L (ref 3.5–5.1)
Sodium: 141 mmol/L (ref 135–145)

## 2024-01-28 LAB — PHOSPHORUS: Phosphorus: 2.9 mg/dL (ref 2.5–4.6)

## 2024-01-28 LAB — MAGNESIUM: Magnesium: 1.4 mg/dL — ABNORMAL LOW (ref 1.7–2.4)

## 2024-01-30 LAB — TACROLIMUS LEVEL: Tacrolimus (FK506) - LabCorp: 6.7 ng/mL (ref 5.0–20.0)

## 2024-02-12 ENCOUNTER — Other Ambulatory Visit (HOSPITAL_COMMUNITY)
Admission: AD | Admit: 2024-02-12 | Discharge: 2024-02-12 | Disposition: A | Discharge status: Home / Self Care | Source: Ambulatory Visit

## 2024-02-12 DIAGNOSIS — N390 Urinary tract infection, site not specified: Secondary | ICD-10-CM | POA: Insufficient documentation

## 2024-02-12 DIAGNOSIS — D631 Anemia in chronic kidney disease: Secondary | ICD-10-CM | POA: Diagnosis not present

## 2024-02-12 DIAGNOSIS — Z940 Kidney transplant status: Secondary | ICD-10-CM | POA: Insufficient documentation

## 2024-02-12 DIAGNOSIS — Z789 Other specified health status: Secondary | ICD-10-CM | POA: Insufficient documentation

## 2024-02-12 DIAGNOSIS — B259 Cytomegaloviral disease, unspecified: Secondary | ICD-10-CM | POA: Insufficient documentation

## 2024-02-12 DIAGNOSIS — Z79899 Other long term (current) drug therapy: Secondary | ICD-10-CM | POA: Diagnosis not present

## 2024-02-12 DIAGNOSIS — E1129 Type 2 diabetes mellitus with other diabetic kidney complication: Secondary | ICD-10-CM | POA: Diagnosis not present

## 2024-02-12 DIAGNOSIS — Z114 Encounter for screening for human immunodeficiency virus [HIV]: Secondary | ICD-10-CM | POA: Diagnosis not present

## 2024-02-12 DIAGNOSIS — Z9483 Pancreas transplant status: Secondary | ICD-10-CM | POA: Diagnosis not present

## 2024-02-12 DIAGNOSIS — D899 Disorder involving the immune mechanism, unspecified: Secondary | ICD-10-CM | POA: Insufficient documentation

## 2024-02-12 DIAGNOSIS — Z09 Encounter for follow-up examination after completed treatment for conditions other than malignant neoplasm: Secondary | ICD-10-CM | POA: Insufficient documentation

## 2024-02-12 DIAGNOSIS — T8610 Unspecified complication of kidney transplant: Secondary | ICD-10-CM | POA: Insufficient documentation

## 2024-02-12 DIAGNOSIS — E559 Vitamin D deficiency, unspecified: Secondary | ICD-10-CM | POA: Diagnosis present

## 2024-02-12 LAB — CBC WITH DIFFERENTIAL/PLATELET
Abs Immature Granulocytes: 0.03 K/uL (ref 0.00–0.07)
Basophils Absolute: 0 K/uL (ref 0.0–0.1)
Basophils Relative: 1 %
Eosinophils Absolute: 0.1 K/uL (ref 0.0–0.5)
Eosinophils Relative: 3 %
HCT: 35 % — ABNORMAL LOW (ref 39.0–52.0)
Hemoglobin: 11.5 g/dL — ABNORMAL LOW (ref 13.0–17.0)
Immature Granulocytes: 1 %
Lymphocytes Relative: 15 %
Lymphs Abs: 0.5 K/uL — ABNORMAL LOW (ref 0.7–4.0)
MCH: 29.6 pg (ref 26.0–34.0)
MCHC: 32.9 g/dL (ref 30.0–36.0)
MCV: 90 fL (ref 80.0–100.0)
Monocytes Absolute: 0.8 K/uL (ref 0.1–1.0)
Monocytes Relative: 25 %
Neutro Abs: 1.8 K/uL (ref 1.7–7.7)
Neutrophils Relative %: 55 %
Platelets: 146 K/uL — ABNORMAL LOW (ref 150–400)
RBC: 3.89 MIL/uL — ABNORMAL LOW (ref 4.22–5.81)
RDW: 13.1 % (ref 11.5–15.5)
WBC: 3.1 K/uL — ABNORMAL LOW (ref 4.0–10.5)
nRBC: 0 % (ref 0.0–0.2)

## 2024-02-12 LAB — PHOSPHORUS: Phosphorus: 2.8 mg/dL (ref 2.5–4.6)

## 2024-02-12 LAB — BASIC METABOLIC PANEL WITH GFR
Anion gap: 9 (ref 5–15)
BUN: 26 mg/dL — ABNORMAL HIGH (ref 6–20)
CO2: 21 mmol/L — ABNORMAL LOW (ref 22–32)
Calcium: 9 mg/dL (ref 8.9–10.3)
Chloride: 106 mmol/L (ref 98–111)
Creatinine, Ser: 2.12 mg/dL — ABNORMAL HIGH (ref 0.61–1.24)
GFR, Estimated: 35 mL/min — ABNORMAL LOW (ref >60)
Glucose, Bld: 111 mg/dL — ABNORMAL HIGH (ref 70–99)
Potassium: 3.6 mmol/L (ref 3.5–5.1)
Sodium: 136 mmol/L (ref 135–145)

## 2024-02-12 LAB — MAGNESIUM: Magnesium: 1.4 mg/dL — ABNORMAL LOW (ref 1.7–2.4)

## 2024-02-14 LAB — TACROLIMUS LEVEL: Tacrolimus (FK506) - LabCorp: 6.9 ng/mL (ref 5.0–20.0)

## 2024-03-25 ENCOUNTER — Other Ambulatory Visit (HOSPITAL_COMMUNITY)
Admission: RE | Admit: 2024-03-25 | Discharge: 2024-03-25 | Disposition: A | Discharge status: Home / Self Care | Source: Ambulatory Visit

## 2024-03-25 DIAGNOSIS — Z789 Other specified health status: Secondary | ICD-10-CM | POA: Insufficient documentation

## 2024-03-25 DIAGNOSIS — T8610 Unspecified complication of kidney transplant: Secondary | ICD-10-CM | POA: Diagnosis not present

## 2024-03-25 DIAGNOSIS — B259 Cytomegaloviral disease, unspecified: Secondary | ICD-10-CM | POA: Diagnosis not present

## 2024-03-25 DIAGNOSIS — N390 Urinary tract infection, site not specified: Secondary | ICD-10-CM | POA: Diagnosis not present

## 2024-03-25 DIAGNOSIS — Z79899 Other long term (current) drug therapy: Secondary | ICD-10-CM | POA: Insufficient documentation

## 2024-03-25 DIAGNOSIS — D899 Disorder involving the immune mechanism, unspecified: Secondary | ICD-10-CM | POA: Insufficient documentation

## 2024-03-25 DIAGNOSIS — Z940 Kidney transplant status: Secondary | ICD-10-CM | POA: Diagnosis present

## 2024-03-25 DIAGNOSIS — Z09 Encounter for follow-up examination after completed treatment for conditions other than malignant neoplasm: Secondary | ICD-10-CM | POA: Diagnosis not present

## 2024-03-25 DIAGNOSIS — D631 Anemia in chronic kidney disease: Secondary | ICD-10-CM | POA: Diagnosis not present

## 2024-03-25 DIAGNOSIS — E559 Vitamin D deficiency, unspecified: Secondary | ICD-10-CM | POA: Diagnosis present

## 2024-03-25 DIAGNOSIS — Z114 Encounter for screening for human immunodeficiency virus [HIV]: Secondary | ICD-10-CM | POA: Insufficient documentation

## 2024-03-25 DIAGNOSIS — E1129 Type 2 diabetes mellitus with other diabetic kidney complication: Secondary | ICD-10-CM | POA: Diagnosis not present

## 2024-03-25 DIAGNOSIS — Z9483 Pancreas transplant status: Secondary | ICD-10-CM | POA: Insufficient documentation

## 2024-03-25 LAB — CBC WITH DIFFERENTIAL/PLATELET
Abs Immature Granulocytes: 0.02 K/uL (ref 0.00–0.07)
Basophils Absolute: 0 K/uL (ref 0.0–0.1)
Basophils Relative: 0 %
Eosinophils Absolute: 0.1 K/uL (ref 0.0–0.5)
Eosinophils Relative: 3 %
HCT: 40.8 % (ref 39.0–52.0)
Hemoglobin: 13.3 g/dL (ref 13.0–17.0)
Immature Granulocytes: 1 %
Lymphocytes Relative: 16 %
Lymphs Abs: 0.6 K/uL — ABNORMAL LOW (ref 0.7–4.0)
MCH: 29.2 pg (ref 26.0–34.0)
MCHC: 32.6 g/dL (ref 30.0–36.0)
MCV: 89.7 fL (ref 80.0–100.0)
Monocytes Absolute: 0.8 K/uL (ref 0.1–1.0)
Monocytes Relative: 23 %
Neutro Abs: 2 K/uL (ref 1.7–7.7)
Neutrophils Relative %: 57 %
Platelets: 149 K/uL — ABNORMAL LOW (ref 150–400)
RBC: 4.55 MIL/uL (ref 4.22–5.81)
RDW: 12.9 % (ref 11.5–15.5)
WBC: 3.4 K/uL — ABNORMAL LOW (ref 4.0–10.5)
nRBC: 0 % (ref 0.0–0.2)

## 2024-03-25 LAB — BASIC METABOLIC PANEL WITH GFR
Anion gap: 13 (ref 5–15)
BUN: 22 mg/dL — ABNORMAL HIGH (ref 6–20)
CO2: 24 mmol/L (ref 22–32)
Calcium: 9.5 mg/dL (ref 8.9–10.3)
Chloride: 102 mmol/L (ref 98–111)
Creatinine, Ser: 2.1 mg/dL — ABNORMAL HIGH (ref 0.61–1.24)
GFR, Estimated: 36 mL/min — ABNORMAL LOW (ref >60)
Glucose, Bld: 113 mg/dL — ABNORMAL HIGH (ref 70–99)
Potassium: 3.6 mmol/L (ref 3.5–5.1)
Sodium: 139 mmol/L (ref 135–145)

## 2024-03-25 LAB — MAGNESIUM: Magnesium: 1.3 mg/dL — ABNORMAL LOW (ref 1.7–2.4)

## 2024-03-25 LAB — PHOSPHORUS: Phosphorus: 2.9 mg/dL (ref 2.5–4.6)

## 2024-03-27 LAB — TACROLIMUS LEVEL: Tacrolimus (FK506) - LabCorp: 7.3 ng/mL (ref 5.0–20.0)

## 2024-04-09 ENCOUNTER — Other Ambulatory Visit (HOSPITAL_COMMUNITY)
Admission: AD | Admit: 2024-04-09 | Discharge: 2024-04-09 | Disposition: A | Discharge status: Home / Self Care | Source: Ambulatory Visit

## 2024-04-09 DIAGNOSIS — E559 Vitamin D deficiency, unspecified: Secondary | ICD-10-CM | POA: Diagnosis not present

## 2024-04-09 DIAGNOSIS — T8610 Unspecified complication of kidney transplant: Secondary | ICD-10-CM | POA: Insufficient documentation

## 2024-04-09 DIAGNOSIS — Z9483 Pancreas transplant status: Secondary | ICD-10-CM | POA: Diagnosis not present

## 2024-04-09 DIAGNOSIS — Z09 Encounter for follow-up examination after completed treatment for conditions other than malignant neoplasm: Secondary | ICD-10-CM | POA: Diagnosis not present

## 2024-04-09 DIAGNOSIS — Z114 Encounter for screening for human immunodeficiency virus [HIV]: Secondary | ICD-10-CM | POA: Insufficient documentation

## 2024-04-09 DIAGNOSIS — D631 Anemia in chronic kidney disease: Secondary | ICD-10-CM | POA: Diagnosis not present

## 2024-04-09 DIAGNOSIS — B259 Cytomegaloviral disease, unspecified: Secondary | ICD-10-CM | POA: Diagnosis not present

## 2024-04-09 DIAGNOSIS — D899 Disorder involving the immune mechanism, unspecified: Secondary | ICD-10-CM | POA: Diagnosis present

## 2024-04-09 DIAGNOSIS — Z940 Kidney transplant status: Secondary | ICD-10-CM | POA: Insufficient documentation

## 2024-04-09 DIAGNOSIS — E1129 Type 2 diabetes mellitus with other diabetic kidney complication: Secondary | ICD-10-CM | POA: Diagnosis not present

## 2024-04-09 DIAGNOSIS — N390 Urinary tract infection, site not specified: Secondary | ICD-10-CM | POA: Insufficient documentation

## 2024-04-09 DIAGNOSIS — Z79899 Other long term (current) drug therapy: Secondary | ICD-10-CM | POA: Diagnosis not present

## 2024-04-09 DIAGNOSIS — Z789 Other specified health status: Secondary | ICD-10-CM | POA: Diagnosis not present

## 2024-04-09 LAB — CBC WITH DIFFERENTIAL/PLATELET
Abs Immature Granulocytes: 0.03 K/uL (ref 0.00–0.07)
Basophils Absolute: 0 K/uL (ref 0.0–0.1)
Basophils Relative: 1 %
Eosinophils Absolute: 0.1 K/uL (ref 0.0–0.5)
Eosinophils Relative: 3 %
HCT: 38.8 % — ABNORMAL LOW (ref 39.0–52.0)
Hemoglobin: 12.7 g/dL — ABNORMAL LOW (ref 13.0–17.0)
Immature Granulocytes: 1 %
Lymphocytes Relative: 20 %
Lymphs Abs: 0.7 K/uL (ref 0.7–4.0)
MCH: 29.7 pg (ref 26.0–34.0)
MCHC: 32.7 g/dL (ref 30.0–36.0)
MCV: 90.7 fL (ref 80.0–100.0)
Monocytes Absolute: 0.8 K/uL (ref 0.1–1.0)
Monocytes Relative: 23 %
Neutro Abs: 1.8 K/uL (ref 1.7–7.7)
Neutrophils Relative %: 52 %
Platelets: 141 K/uL — ABNORMAL LOW (ref 150–400)
RBC: 4.28 MIL/uL (ref 4.22–5.81)
RDW: 13 % (ref 11.5–15.5)
WBC: 3.5 K/uL — ABNORMAL LOW (ref 4.0–10.5)
nRBC: 0 % (ref 0.0–0.2)

## 2024-04-09 LAB — BASIC METABOLIC PANEL WITH GFR
Anion gap: 14 (ref 5–15)
BUN: 23 mg/dL — ABNORMAL HIGH (ref 6–20)
CO2: 24 mmol/L (ref 22–32)
Calcium: 9.2 mg/dL (ref 8.9–10.3)
Chloride: 102 mmol/L (ref 98–111)
Creatinine, Ser: 2.19 mg/dL — ABNORMAL HIGH (ref 0.61–1.24)
GFR, Estimated: 34 mL/min — ABNORMAL LOW (ref >60)
Glucose, Bld: 109 mg/dL — ABNORMAL HIGH (ref 70–99)
Potassium: 4.1 mmol/L (ref 3.5–5.1)
Sodium: 140 mmol/L (ref 135–145)

## 2024-04-09 LAB — MAGNESIUM: Magnesium: 1.3 mg/dL — ABNORMAL LOW (ref 1.7–2.4)

## 2024-04-09 LAB — PHOSPHORUS: Phosphorus: 2.8 mg/dL (ref 2.5–4.6)

## 2024-04-10 LAB — TACROLIMUS LEVEL: Tacrolimus (FK506) - LabCorp: 7 ng/mL (ref 5.0–20.0)
